# Patient Record
Sex: Female | Born: 1960 | Race: White | Hispanic: No | Marital: Married | Smoking: Never smoker
Health system: Southern US, Community
[De-identification: ages and names within clinical notes are randomized; demographics above are authoritative.]

## PROBLEM LIST (undated history)

## (undated) DIAGNOSIS — Z9889 Other specified postprocedural states: Secondary | ICD-10-CM

## (undated) DIAGNOSIS — H409 Unspecified glaucoma: Secondary | ICD-10-CM

## (undated) DIAGNOSIS — C50912 Malignant neoplasm of unspecified site of left female breast: Secondary | ICD-10-CM

## (undated) DIAGNOSIS — T7840XA Allergy, unspecified, initial encounter: Secondary | ICD-10-CM

## (undated) DIAGNOSIS — K5792 Diverticulitis of intestine, part unspecified, without perforation or abscess without bleeding: Secondary | ICD-10-CM

## (undated) DIAGNOSIS — F909 Attention-deficit hyperactivity disorder, unspecified type: Secondary | ICD-10-CM

## (undated) DIAGNOSIS — J189 Pneumonia, unspecified organism: Secondary | ICD-10-CM

## (undated) DIAGNOSIS — C50919 Malignant neoplasm of unspecified site of unspecified female breast: Secondary | ICD-10-CM

## (undated) DIAGNOSIS — I779 Disorder of arteries and arterioles, unspecified: Secondary | ICD-10-CM

## (undated) DIAGNOSIS — R112 Nausea with vomiting, unspecified: Secondary | ICD-10-CM

## (undated) DIAGNOSIS — C4490 Unspecified malignant neoplasm of skin, unspecified: Secondary | ICD-10-CM

## (undated) DIAGNOSIS — E785 Hyperlipidemia, unspecified: Secondary | ICD-10-CM

## (undated) DIAGNOSIS — F419 Anxiety disorder, unspecified: Secondary | ICD-10-CM

## (undated) DIAGNOSIS — Z923 Personal history of irradiation: Secondary | ICD-10-CM

## (undated) DIAGNOSIS — T8859XA Other complications of anesthesia, initial encounter: Secondary | ICD-10-CM

## (undated) DIAGNOSIS — Z1379 Encounter for other screening for genetic and chromosomal anomalies: Principal | ICD-10-CM

## (undated) HISTORY — DX: Unspecified malignant neoplasm of skin, unspecified: C44.90

## (undated) HISTORY — DX: Hyperlipidemia, unspecified: E78.5

## (undated) HISTORY — PX: TONSILLECTOMY: SUR1361

## (undated) HISTORY — PX: BREAST BIOPSY: SHX20

## (undated) HISTORY — DX: Allergy, unspecified, initial encounter: T78.40XA

## (undated) HISTORY — PX: OTHER SURGICAL HISTORY: SHX169

## (undated) HISTORY — DX: Unspecified glaucoma: H40.9

## (undated) HISTORY — DX: Encounter for other screening for genetic and chromosomal anomalies: Z13.79

---

## 1983-02-20 HISTORY — PX: WISDOM TOOTH EXTRACTION: SHX21

## 2006-12-13 ENCOUNTER — Encounter: Admission: RE | Admit: 2006-12-13 | Discharge: 2006-12-13 | Payer: Self-pay | Admitting: Obstetrics and Gynecology

## 2006-12-16 ENCOUNTER — Encounter (INDEPENDENT_AMBULATORY_CARE_PROVIDER_SITE_OTHER): Payer: Self-pay | Admitting: Diagnostic Radiology

## 2006-12-16 ENCOUNTER — Encounter: Admission: RE | Admit: 2006-12-16 | Discharge: 2006-12-16 | Payer: Self-pay | Admitting: Obstetrics and Gynecology

## 2007-07-04 ENCOUNTER — Encounter: Admission: RE | Admit: 2007-07-04 | Discharge: 2007-07-04 | Payer: Self-pay | Admitting: Obstetrics and Gynecology

## 2008-01-08 ENCOUNTER — Encounter: Admission: RE | Admit: 2008-01-08 | Discharge: 2008-01-08 | Payer: Self-pay | Admitting: Obstetrics and Gynecology

## 2009-01-10 ENCOUNTER — Encounter: Admission: RE | Admit: 2009-01-10 | Discharge: 2009-01-10 | Payer: Self-pay | Admitting: Obstetrics and Gynecology

## 2010-01-17 ENCOUNTER — Encounter: Admission: RE | Admit: 2010-01-17 | Discharge: 2010-01-17 | Payer: Self-pay | Admitting: Obstetrics and Gynecology

## 2011-01-30 ENCOUNTER — Other Ambulatory Visit: Payer: Self-pay | Admitting: Obstetrics and Gynecology

## 2011-01-30 DIAGNOSIS — Z1231 Encounter for screening mammogram for malignant neoplasm of breast: Secondary | ICD-10-CM

## 2011-03-07 ENCOUNTER — Ambulatory Visit
Admission: RE | Admit: 2011-03-07 | Discharge: 2011-03-07 | Disposition: A | Discharge status: Home / Self Care | Payer: BC Managed Care – PPO | Source: Ambulatory Visit | Attending: Obstetrics and Gynecology | Admitting: Obstetrics and Gynecology

## 2011-03-07 ENCOUNTER — Ambulatory Visit: Payer: Self-pay

## 2011-03-07 DIAGNOSIS — Z1231 Encounter for screening mammogram for malignant neoplasm of breast: Secondary | ICD-10-CM

## 2012-02-26 ENCOUNTER — Other Ambulatory Visit: Payer: Self-pay | Admitting: Obstetrics and Gynecology

## 2012-02-26 DIAGNOSIS — Z1231 Encounter for screening mammogram for malignant neoplasm of breast: Secondary | ICD-10-CM

## 2012-03-21 ENCOUNTER — Ambulatory Visit
Admission: RE | Admit: 2012-03-21 | Discharge: 2012-03-21 | Disposition: A | Discharge status: Home / Self Care | Payer: BC Managed Care – PPO | Source: Ambulatory Visit | Attending: Obstetrics and Gynecology | Admitting: Obstetrics and Gynecology

## 2012-03-21 DIAGNOSIS — Z1231 Encounter for screening mammogram for malignant neoplasm of breast: Secondary | ICD-10-CM

## 2013-03-16 ENCOUNTER — Other Ambulatory Visit: Payer: Self-pay

## 2013-03-16 DIAGNOSIS — Z1231 Encounter for screening mammogram for malignant neoplasm of breast: Secondary | ICD-10-CM

## 2013-04-01 ENCOUNTER — Ambulatory Visit
Admission: RE | Admit: 2013-04-01 | Discharge: 2013-04-01 | Disposition: A | Discharge status: Home / Self Care | Payer: BC Managed Care – PPO | Source: Ambulatory Visit

## 2013-04-01 DIAGNOSIS — Z1231 Encounter for screening mammogram for malignant neoplasm of breast: Secondary | ICD-10-CM

## 2014-03-18 ENCOUNTER — Other Ambulatory Visit: Payer: Self-pay

## 2014-03-18 DIAGNOSIS — Z1231 Encounter for screening mammogram for malignant neoplasm of breast: Secondary | ICD-10-CM

## 2014-04-05 ENCOUNTER — Ambulatory Visit: Payer: Self-pay

## 2014-04-06 ENCOUNTER — Ambulatory Visit: Payer: Self-pay

## 2014-04-13 ENCOUNTER — Ambulatory Visit
Admission: RE | Admit: 2014-04-13 | Discharge: 2014-04-13 | Disposition: A | Discharge status: Home / Self Care | Payer: BLUE CROSS/BLUE SHIELD | Source: Ambulatory Visit

## 2014-04-13 DIAGNOSIS — Z1231 Encounter for screening mammogram for malignant neoplasm of breast: Secondary | ICD-10-CM

## 2015-03-30 ENCOUNTER — Other Ambulatory Visit: Payer: Self-pay

## 2015-03-30 DIAGNOSIS — Z1231 Encounter for screening mammogram for malignant neoplasm of breast: Secondary | ICD-10-CM

## 2015-04-26 ENCOUNTER — Ambulatory Visit: Payer: BLUE CROSS/BLUE SHIELD

## 2015-05-10 ENCOUNTER — Ambulatory Visit
Admission: RE | Admit: 2015-05-10 | Discharge: 2015-05-10 | Disposition: A | Discharge status: Home / Self Care | Payer: BLUE CROSS/BLUE SHIELD | Source: Ambulatory Visit

## 2015-05-10 DIAGNOSIS — Z1231 Encounter for screening mammogram for malignant neoplasm of breast: Secondary | ICD-10-CM

## 2016-02-20 DIAGNOSIS — Z923 Personal history of irradiation: Secondary | ICD-10-CM

## 2016-02-20 HISTORY — DX: Personal history of irradiation: Z92.3

## 2016-04-06 ENCOUNTER — Other Ambulatory Visit: Payer: Self-pay | Admitting: Obstetrics and Gynecology

## 2016-04-06 DIAGNOSIS — Z1231 Encounter for screening mammogram for malignant neoplasm of breast: Secondary | ICD-10-CM

## 2016-05-10 ENCOUNTER — Ambulatory Visit
Admission: RE | Admit: 2016-05-10 | Discharge: 2016-05-10 | Disposition: A | Discharge status: Home / Self Care | Payer: BLUE CROSS/BLUE SHIELD | Source: Ambulatory Visit | Attending: Obstetrics and Gynecology | Admitting: Obstetrics and Gynecology

## 2016-05-10 ENCOUNTER — Ambulatory Visit: Payer: BLUE CROSS/BLUE SHIELD

## 2016-05-10 DIAGNOSIS — Z1231 Encounter for screening mammogram for malignant neoplasm of breast: Secondary | ICD-10-CM

## 2016-05-11 ENCOUNTER — Other Ambulatory Visit: Payer: Self-pay | Admitting: Obstetrics and Gynecology

## 2016-05-11 DIAGNOSIS — R928 Other abnormal and inconclusive findings on diagnostic imaging of breast: Secondary | ICD-10-CM

## 2016-05-11 DIAGNOSIS — R921 Mammographic calcification found on diagnostic imaging of breast: Secondary | ICD-10-CM

## 2016-05-16 ENCOUNTER — Ambulatory Visit
Admission: RE | Admit: 2016-05-16 | Discharge: 2016-05-16 | Disposition: A | Discharge status: Home / Self Care | Payer: BLUE CROSS/BLUE SHIELD | Source: Ambulatory Visit | Attending: Obstetrics and Gynecology | Admitting: Obstetrics and Gynecology

## 2016-05-16 ENCOUNTER — Other Ambulatory Visit: Payer: Self-pay | Admitting: Obstetrics and Gynecology

## 2016-05-16 DIAGNOSIS — N6489 Other specified disorders of breast: Secondary | ICD-10-CM

## 2016-05-16 DIAGNOSIS — R928 Other abnormal and inconclusive findings on diagnostic imaging of breast: Secondary | ICD-10-CM

## 2016-05-16 DIAGNOSIS — R921 Mammographic calcification found on diagnostic imaging of breast: Secondary | ICD-10-CM

## 2016-05-22 ENCOUNTER — Ambulatory Visit
Admission: RE | Admit: 2016-05-22 | Discharge: 2016-05-22 | Disposition: A | Discharge status: Home / Self Care | Payer: BLUE CROSS/BLUE SHIELD | Source: Ambulatory Visit | Attending: Obstetrics and Gynecology | Admitting: Obstetrics and Gynecology

## 2016-05-22 ENCOUNTER — Other Ambulatory Visit: Payer: Self-pay | Admitting: Obstetrics and Gynecology

## 2016-05-22 DIAGNOSIS — N6489 Other specified disorders of breast: Secondary | ICD-10-CM

## 2016-05-23 ENCOUNTER — Telehealth: Payer: Self-pay | Admitting: *Deleted

## 2016-05-23 NOTE — Telephone Encounter (Signed)
Confirmed [facility] for 05/30/16 at 0815 .  Instructions and contact information given.  

## 2016-05-25 ENCOUNTER — Encounter: Payer: Self-pay | Admitting: *Deleted

## 2016-05-25 ENCOUNTER — Other Ambulatory Visit: Payer: Self-pay | Admitting: *Deleted

## 2016-05-25 DIAGNOSIS — Z170 Estrogen receptor positive status [ER+]: Secondary | ICD-10-CM

## 2016-05-25 DIAGNOSIS — C50212 Malignant neoplasm of upper-inner quadrant of left female breast: Secondary | ICD-10-CM

## 2016-05-29 ENCOUNTER — Ambulatory Visit: Payer: BLUE CROSS/BLUE SHIELD

## 2016-05-30 ENCOUNTER — Encounter: Payer: Self-pay | Admitting: Hematology

## 2016-05-30 ENCOUNTER — Ambulatory Visit: Payer: BLUE CROSS/BLUE SHIELD | Attending: General Surgery | Admitting: Physical Therapy

## 2016-05-30 ENCOUNTER — Other Ambulatory Visit (HOSPITAL_BASED_OUTPATIENT_CLINIC_OR_DEPARTMENT_OTHER): Payer: BLUE CROSS/BLUE SHIELD

## 2016-05-30 ENCOUNTER — Ambulatory Visit
Admission: RE | Admit: 2016-05-30 | Discharge: 2016-05-30 | Disposition: A | Discharge status: Home / Self Care | Payer: BLUE CROSS/BLUE SHIELD | Source: Ambulatory Visit | Attending: Radiation Oncology | Admitting: Radiation Oncology

## 2016-05-30 ENCOUNTER — Ambulatory Visit (HOSPITAL_BASED_OUTPATIENT_CLINIC_OR_DEPARTMENT_OTHER): Payer: BLUE CROSS/BLUE SHIELD | Admitting: Hematology

## 2016-05-30 ENCOUNTER — Other Ambulatory Visit: Payer: Self-pay | Admitting: *Deleted

## 2016-05-30 ENCOUNTER — Other Ambulatory Visit: Payer: Self-pay | Admitting: General Surgery

## 2016-05-30 ENCOUNTER — Encounter: Payer: Self-pay | Admitting: Physical Therapy

## 2016-05-30 VITALS — BP 134/66 | HR 75 | Temp 97.7°F | Resp 18 | Wt 134.1 lb

## 2016-05-30 DIAGNOSIS — Z888 Allergy status to other drugs, medicaments and biological substances status: Secondary | ICD-10-CM | POA: Insufficient documentation

## 2016-05-30 DIAGNOSIS — R293 Abnormal posture: Secondary | ICD-10-CM | POA: Insufficient documentation

## 2016-05-30 DIAGNOSIS — C50212 Malignant neoplasm of upper-inner quadrant of left female breast: Secondary | ICD-10-CM

## 2016-05-30 DIAGNOSIS — H409 Unspecified glaucoma: Secondary | ICD-10-CM | POA: Diagnosis not present

## 2016-05-30 DIAGNOSIS — Z170 Estrogen receptor positive status [ER+]: Secondary | ICD-10-CM | POA: Insufficient documentation

## 2016-05-30 DIAGNOSIS — Z79899 Other long term (current) drug therapy: Secondary | ICD-10-CM | POA: Insufficient documentation

## 2016-05-30 DIAGNOSIS — Z85828 Personal history of other malignant neoplasm of skin: Secondary | ICD-10-CM | POA: Insufficient documentation

## 2016-05-30 DIAGNOSIS — Z9889 Other specified postprocedural states: Secondary | ICD-10-CM | POA: Insufficient documentation

## 2016-05-30 DIAGNOSIS — F419 Anxiety disorder, unspecified: Secondary | ICD-10-CM | POA: Insufficient documentation

## 2016-05-30 DIAGNOSIS — Z510 Encounter for antineoplastic radiation therapy: Secondary | ICD-10-CM | POA: Insufficient documentation

## 2016-05-30 DIAGNOSIS — Z8541 Personal history of malignant neoplasm of cervix uteri: Secondary | ICD-10-CM | POA: Insufficient documentation

## 2016-05-30 DIAGNOSIS — Z803 Family history of malignant neoplasm of breast: Secondary | ICD-10-CM | POA: Insufficient documentation

## 2016-05-30 LAB — COMPREHENSIVE METABOLIC PANEL
ALT: 35 U/L (ref 0–55)
AST: 24 U/L (ref 5–34)
Albumin: 4.4 g/dL (ref 3.5–5.0)
Alkaline Phosphatase: 74 U/L (ref 40–150)
Anion Gap: 9 mEq/L (ref 3–11)
BUN: 11.3 mg/dL (ref 7.0–26.0)
CHLORIDE: 104 meq/L (ref 98–109)
CO2: 27 meq/L (ref 22–29)
Calcium: 9.7 mg/dL (ref 8.4–10.4)
Creatinine: 0.7 mg/dL (ref 0.6–1.1)
EGFR: 90 mL/min/{1.73_m2} — AB (ref >90)
Glucose: 85 mg/dl (ref 70–140)
Potassium: 3.5 mEq/L (ref 3.5–5.1)
Sodium: 140 mEq/L (ref 136–145)
Total Bilirubin: 0.59 mg/dL (ref 0.20–1.20)
Total Protein: 7.8 g/dL (ref 6.4–8.3)

## 2016-05-30 LAB — CBC WITH DIFFERENTIAL/PLATELET
BASO%: 0.6 % (ref 0.0–2.0)
BASOS ABS: 0 10*3/uL (ref 0.0–0.1)
EOS ABS: 0.1 10*3/uL (ref 0.0–0.5)
EOS%: 1.3 % (ref 0.0–7.0)
HEMATOCRIT: 41.1 % (ref 34.8–46.6)
HEMOGLOBIN: 14.2 g/dL (ref 11.6–15.9)
LYMPH%: 32.3 % (ref 14.0–49.7)
MCH: 34.5 pg — AB (ref 25.1–34.0)
MCHC: 34.6 g/dL (ref 31.5–36.0)
MCV: 99.7 fL (ref 79.5–101.0)
MONO#: 0.6 10*3/uL (ref 0.1–0.9)
MONO%: 11.2 % (ref 0.0–14.0)
NEUT#: 2.8 10*3/uL (ref 1.5–6.5)
NEUT%: 54.6 % (ref 38.4–76.8)
Platelets: 169 10*3/uL (ref 145–400)
RBC: 4.13 10*6/uL (ref 3.70–5.45)
RDW: 12.4 % (ref 11.2–14.5)
WBC: 5.1 10*3/uL (ref 3.9–10.3)
lymph#: 1.6 10*3/uL (ref 0.9–3.3)

## 2016-05-30 NOTE — Progress Notes (Signed)
Nutrition Assessment  Reason for Assessment:  Pt seen in Breast Clinic  ASSESSMENT:   57 year old female with new diagnosis of left breast cancer.  Past medical history reviewed  Medications:  reviewed  Labs: reviewed  Anthropometrics:   Height: not reported Weight: 134 lb 1.6 oz    NUTRITION DIAGNOSIS: Food and nutrition related knowledge deficit related to new diagnosis of breast cancer as evidenced by no prior need for nutrition related information.  INTERVENTION:   Discussed and provided packet of information regarding nutritional tips for breast cancer patients.  Questions answered.  Teachback method used.  Contact information provided and patient knows to contact me with questions/concerns.    MONITORING, EVALUATION, and GOAL: Pt will consume a healthy plant based diet to maintain lean body mass throughout treatment.   Bryan Goin B. Zenia Resides, [place], [place] Registered Dietitian [phone] (pager)

## 2016-05-30 NOTE — Patient Instructions (Signed)

## 2016-05-30 NOTE — Progress Notes (Signed)
Radiation Oncology         [phone] ________________________________  Name: Sally Smith MRN: [id number]  Date: 05/30/2016  DOB: 1960/07/15  GE:XBMWU,XLKGMWN, MD  Stark Klein, MD     REFERRING PHYSICIAN: Stark Klein, MD   DIAGNOSIS: The encounter diagnosis was Malignant neoplasm of upper-inner quadrant of left breast in female, estrogen receptor positive ([place]).   HISTORY OF PRESENT ILLNESS: Sally Smith is a 56 y.o. female seen at in the multidisciplinary breast clinic for a new diagnosis of left breast cancer. The patient was found to have distortion of the left breast. Diagnostic imaging revealed a 6 mm lesion at the 11:30 position, and her axilla was negative for adenopathy. She underwent biopsy of the breast lesion on 05/22/16 which revealed a grad 1-2 invasive lobular carcinoma, ER/PR positive, HER2 negative, with a Ki 67 of 5%. She comes for     PREVIOUS RADIATION THERAPY: No   PAST MEDICAL HISTORY:  Past Medical History:  Diagnosis Date  . Cervical cancer ([place])        PAST SURGICAL HISTORY: Past Surgical History:  Procedure Laterality Date  . TONSILLECTOMY       FAMILY HISTORY:  Family History  Problem Relation Age of Onset  . Breast cancer Mother 21     SOCIAL HISTORY:  reports that she has never smoked. She does not have any smokeless tobacco history on file. She reports that she does not drink alcohol or use drugs. The patient is married and lives in [place], [place].    ALLERGIES: Patient has no allergy information on record.   MEDICATIONS:  No current outpatient prescriptions on file.   No current facility-administered medications for this encounter.      REVIEW OF SYSTEMS: On review of systems, the patient reports that she is doing well overall. She denies any chest pain, shortness of breath, cough, fevers, chills, night sweats, unintended weight changes. She denies any bowel or bladder disturbances, and denies abdominal pain, nausea or  vomiting. She denies any new musculoskeletal or joint aches or pains. A complete review of systems is obtained and is otherwise negative.     PHYSICAL EXAM:  Wt Readings from Last 3 Encounters:  05/30/16 134 lb 1.6 oz (60.8 kg)   Temp Readings from Last 3 Encounters:  05/30/16 97.7 F (36.5 C) (Oral)   BP Readings from Last 3 Encounters:  05/30/16 134/66   Pulse Readings from Last 3 Encounters:  05/30/16 75      In general this is a well appearing cauvasian female in no acute distress. She is alert and oriented x4 and appropriate throughout the examination. HEENT reveals that the patient is normocephalic, atraumatic. EOMs are intact. PERRLA. Skin is intact without any evidence of gross lesions. Cardiovascular exam reveals a regular rate and rhythm, no clicks rubs or murmurs are auscultated. Chest is clear to auscultation bilaterally. Lymphatic assessment is performed and does not reveal any adenopathy in the cervical, supraclavicular, axillary, or inguinal chains. Bilateral breast exam is performed. The right breast is negative for palpable masses. The left breast reveals post biopsy changes with ecchymosis and induration deep to the biopsy site. No palpable mass is appreciated, and no nipple bleeding or discharge is noted of either breast.  Abdomen has active bowel sounds in all quadrants and is intact. The abdomen is soft, non tender, non distended. Lower extremities are negative for pretibial pitting edema, deep calf tenderness, cyanosis or clubbing.   ECOG = 0  0 - Asymptomatic (  Fully active, able to carry on all predisease activities without restriction)  1 - Symptomatic but completely ambulatory (Restricted in physically strenuous activity but ambulatory and able to carry out work of a light or sedentary nature. For example, light housework, office work)  2 - Symptomatic, <50% in bed during the day (Ambulatory and capable of all self care but unable to carry out any work  activities. Up and about more than 50% of waking hours)  3 - Symptomatic, >50% in bed, but not bedbound (Capable of only limited self-care, confined to bed or chair 50% or more of waking hours)  4 - Bedbound (Completely disabled. Cannot carry on any self-care. Totally confined to bed or chair)  5 - Death   Santiago Glad MM, Creech RH, Tormey DC, et al. [phone]). "Toxicity and response criteria of the [facility]". Am. Evlyn Clines. Oncol. 5 (6): 649-55    LABORATORY DATA:  Lab Results  Component Value Date   WBC 5.1 05/30/2016   HGB 14.2 05/30/2016   HCT 41.1 05/30/2016   MCV 99.7 05/30/2016   PLT 169 05/30/2016   Lab Results  Component Value Date   NA 140 05/30/2016   K 3.5 05/30/2016   CO2 27 05/30/2016   Lab Results  Component Value Date   ALT 35 05/30/2016   AST 24 05/30/2016   ALKPHOS 74 05/30/2016   BILITOT 0.59 05/30/2016      RADIOGRAPHY: US Breast Ltd Uni Left Inc Axilla  Result Date: 05/16/2016 CLINICAL DATA:  The patient returns after screening study for evaluation of possible left breast distortion and calcifications. EXAM: DIGITAL DIAGNOSTIC LEFT MAMMOGRAM WITH CAD ULTRASOUND LEFT BREAST COMPARISON:  Prior studies including 05/10/2016 ACR Breast Density Category c: The breast tissue is heterogeneously dense, which may obscure small masses. FINDINGS: Additional images are performed, showing no suspicious microcalcifications in the left breast. 2D/3D images confirm presence of distortion in the upper inner quadrant of the left breast. Other area of questioned distortion is not persistent on further evaluation. Mammographic images were processed with CAD. On physical exam, I palpate no abnormality in the upper central portion of the left breast. Targeted ultrasound is performed, showing a vague area of acoustic shadowing in the 11:30 o'clock location of the left breast 3 cm from nipple. This is estimated to measure approximately 6 mm and is associated with  adjacent vascularity on Doppler evaluation. Evaluation of the axilla is negative for adenopathy. IMPRESSION: 1. Persistent distortion in the upper inner quadrant of the left breast possibly correlating with the sonographic area of acoustic shadowing. 2. Lesion is best seen is radiographically. RECOMMENDATION: Stereotactic guided core biopsy is recommended of the left breast distortion. I have discussed the findings and recommendations with the patient. Results were also provided in writing at the conclusion of the visit. If applicable, a reminder letter will be sent to the patient regarding the next appointment. BI-RADS CATEGORY  4: Suspicious. Electronically Signed   By: Norva Pavlov M.D.   On: 05/16/2016 17:37   Mm Diag Breast Tomo Uni Left  Result Date: 05/16/2016 CLINICAL DATA:  The patient returns after screening study for evaluation of possible left breast distortion and calcifications. EXAM: DIGITAL DIAGNOSTIC LEFT MAMMOGRAM WITH CAD ULTRASOUND LEFT BREAST COMPARISON:  Prior studies including 05/10/2016 ACR Breast Density Category c: The breast tissue is heterogeneously dense, which may obscure small masses. FINDINGS: Additional images are performed, showing no suspicious microcalcifications in the left breast. 2D/3D images confirm presence of distortion in the upper  inner quadrant of the left breast. Other area of questioned distortion is not persistent on further evaluation. Mammographic images were processed with CAD. On physical exam, I palpate no abnormality in the upper central portion of the left breast. Targeted ultrasound is performed, showing a vague area of acoustic shadowing in the 11:30 o'clock location of the left breast 3 cm from nipple. This is estimated to measure approximately 6 mm and is associated with adjacent vascularity on Doppler evaluation. Evaluation of the axilla is negative for adenopathy. IMPRESSION: 1. Persistent distortion in the upper inner quadrant of the left breast  possibly correlating with the sonographic area of acoustic shadowing. 2. Lesion is best seen is radiographically. RECOMMENDATION: Stereotactic guided core biopsy is recommended of the left breast distortion. I have discussed the findings and recommendations with the patient. Results were also provided in writing at the conclusion of the visit. If applicable, a reminder letter will be sent to the patient regarding the next appointment. BI-RADS CATEGORY  4: Suspicious. Electronically Signed   By: Nolon Nations M.D.   On: 05/16/2016 17:37   Mm Screening Breast Tomo Bilateral  Result Date: 05/10/2016 CLINICAL DATA:  Screening. EXAM: 2D DIGITAL SCREENING BILATERAL MAMMOGRAM WITH CAD AND ADJUNCT TOMO COMPARISON:  Previous exam(s). ACR Breast Density Category c: The breast tissue is heterogeneously dense, which may obscure small masses. FINDINGS: In the left breast, possible distortion with calcifications warrants further evaluation. In the right breast, no findings suspicious for malignancy. Images were processed with CAD. IMPRESSION: Further evaluation is suggested for possible distortion with calcifications in the left breast. RECOMMENDATION: Diagnostic mammogram and possibly ultrasound of the left breast. (Code:[id number]) The patient will be contacted regarding the findings, and additional imaging will be scheduled. BI-RADS CATEGORY  0: Incomplete. Need additional imaging evaluation and/or prior mammograms for comparison. Electronically Signed   By: Ammie Ferrier M.D.   On: 05/10/2016 15:21   Mm Clip Placement Left  Result Date: 05/22/2016 CLINICAL DATA:  Status post stereotactic guided core biopsy of distortion in the left breast. EXAM: 3D DIAGNOSTIC LEFT MAMMOGRAM POST STEREOTACTIC BIOPSY COMPARISON:  Previous exam(s). FINDINGS: 3D Mammographic images were obtained following stereotactic guided biopsy of distortion in the upper inner quadrant of the left breast cyst. Clip was placed at the conclusion of  the biopsy, with the patient in Trendelenburg following a vasovagal reaction. Clip is within the upper inner quadrant of the left breast, 1 cm anterior and inferior to the central point of distortion. IMPRESSION: Tissue marker clip is 1 cm anterior and inferior to the distortion. Final Assessment: Post Procedure Mammograms for Marker Placement Electronically Signed   By: Nolon Nations M.D.   On: 05/22/2016 11:27   Mm Lt Breast Bx W Loc Dev 1st Lesion Image Bx Spec Stereo Guide  Addendum Date: 05/24/2016   ADDENDUM REPORT: 05/23/2016 15:44 ADDENDUM: Pathology revealed GRADE I-II INVASIVE AND IN SITU MAMMARY CARCINOMA of the Left breast, upper inner. This was found to be concordant by Dr. Nolon Nations. Pathology results were discussed with the patient by telephone. The patient reported doing well after the biopsy with tenderness and minimal bleeding at the site. Post biopsy instructions and care were reviewed and questions were answered. The patient was encouraged to call [facility] for any additional concerns. The patient was referred to [facility] on May 30, 2016. Pathology results reported by Terie Purser, RN on 05/23/2016. Electronically Signed   By:  Nolon Nations M.D.   On: 05/23/2016 15:44   Result Date: 05/24/2016 CLINICAL DATA:  Patient presents for stereotactic guided core biopsy of distortion in the left breast. EXAM: LEFT BREAST STEREOTACTIC CORE NEEDLE BIOPSY COMPARISON:  Previous exams. FINDINGS: The patient and I discussed the procedure of stereotactic-guided biopsy including benefits and alternatives. We discussed the high likelihood of a successful procedure. We discussed the risks of the procedure including infection, bleeding, tissue injury, clip migration, and inadequate sampling. Informed written consent was given. The usual time out protocol was performed immediately prior to the  procedure. Using sterile technique and 1% Lidocaine as local anesthetic, under stereotactic guidance, a 9 gauge vacuum assisted device was used to perform core needle biopsy of distortion in the upper inner quadrant of the left breast using a cephalad approach. After the fourth of 6 biopsy cores, the patient had a vasovagal reaction. She was placed in Trendelenburg position. Patient quickly recovered over the next 5 minutes, and observed for another 30-45 minutes. A coil shaped tissue marker clip was placed while the patient was in Trendelenburg position. Compression was applied to ensure hemostasis. Follow-up 2-view mammogram was performed and dictated separately. IMPRESSION: Stereotactic-guided biopsy of left breast distortion. Procedure was complicated by a vasovagal reaction. Electronically Signed: By: Nolon Nations M.D. On: 05/22/2016 10:49    IMPRESSION/PLAN: 1. Stage IA, cT1a,N0,M0 grade 1-2, ER/PR positive invasive lobular carcinoma of the left breast. Dr. Lisbeth Renshaw discusses the pathology findings and reviews the nature of invasive lobular disease. The consensus from the breast conference included an MRI for extent of disease, as well as breast conservation with lumpectomy and sentinel mapping. If her tumor was greater than 1 cm on final pathology, oncotype would be ordered. Provided that chemotherapy is not indicated, the patient's course would then be followed by external radiotherapy to the breast followed by antiestrogen therapy. We discussed the risks, benefits, short, and long term effects of radiotherapy, and the patient is interested in proceeding. Dr. Lisbeth Renshaw discusses the delivery and logistics of radiotherapy. Dr. Lisbeth Renshaw would recommend a course of 6 1/2 weeks of treatment, and discussed deep inspiration breath hold technique to avoid the heart. The patient lives in [place], [place] but is interested in keeping her care here in [place]. We will see her back about 2 weeks after surgery to move  forward with the simulation and planning process and anticipate starting radiotherapy about 4 weeks after surgery.  2. Possible genetic predisposition to malignancy. The patient is planning to meet with genetic counseling urgently. We will follow up with this expectantly.   The above documentation reflects my direct findings during this shared patient visit. Please see the separate note by Dr. Lisbeth Renshaw on this date for the remainder of the patient's plan of care.    Carola Rhine, PAC

## 2016-05-30 NOTE — Progress Notes (Signed)
[place]  [phone] Fax:([phone]) [place] Note   Patient Care Team: Hoyt Koch, MD as PCP - General (Obstetrics and Gynecology) [name], MD as Consulting Physician (Hematology) Stark Klein, MD as Consulting Physician (General Surgery) Kyung Rudd, MD as Consulting Physician (Radiation Oncology) 05/30/2016  CHIEF COMPLAINTS/PURPOSE OF CONSULTATION:  Left breast cancer  Oncology History   Cancer Staging Malignant neoplasm of upper-inner quadrant of left breast in female, estrogen receptor positive ([place]) Staging form: Breast, AJCC 8th Edition - Clinical stage from 05/22/2016: Stage IA (cT1a, cN0, cM0, G2, ER: Positive, PR: Positive, HER2: Negative) - Signed by [name], MD on 05/29/2016       Malignant neoplasm of upper-inner quadrant of left breast in female, estrogen receptor positive ([place])   05/10/2016 Mammogram    Bilateral screening mammogram on 05/10/16 showed a possible distortion with calcifications in the left breast.      05/16/2016 Mammogram    Diagnostic mammogram and US showed Persistent distortion in the upper inner quadrant of the left breast possibly correlating with the sonographic area of acoustic shadowing, measuring about 54m. [place] left axilla (-)      05/22/2016 Receptors her2    ER 100%, PR 70%+, Ki67 5%      05/22/2016 Initial Biopsy    Diagnosis Breast, left, needle core biopsy, upper inner - INVASIVE AND IN SITU LOBULAR CARCINOMA, G1-2      05/22/2016 Initial Diagnosis    Malignant neoplasm of upper-inner quadrant of left breast in female, estrogen receptor positive ([place]       HISTORY OF PRESENTING ILLNESS (05/30/2016):  Sally [id number].o. female is here because of a new diagnosis of left breast cancer.  Bilateral screening mammogram on 05/10/16 showed a possible distortion with calcifications in the left breast. Diagnostic left breast mammogram on 05/16/16 showed no suspicious  microcalcifications in the left breast, but did confirm the presence of distortion in the upper inner quadrant of the left breast. Other areas of questioned distortion was not persistent on further evaluation. [place] the left breast showed a vague area of acoustic shadowing in the 11:30 position 3 cm fron the nipple measuring about  0.6 cm and associated with adjacent vascularity on Doppler evaluation. The left axilla was negative for adenopathy.  Biopsy of the UIQ of the left breast on 05/22/16 revealed grade 1-2 revealed invasive lobular carcinoma and LCIS. ER 100%+, PR 70%+, Ki67 5%  The patient presents today with her husband in multidisciplinary breast clinic to discuss treatment options for the management of her disease. The patient's breast density category is C: The breast tissue is heterogeneously dense, which may obscure small masses.  GYN HISTORY  Menarchal: 9 LMP: 48 Contraceptive: Post-menopause HRT: No GP: G3P2, one child miscarriage  Denies pain, discomfort, arthritis, changes in appetite or weight, HTN, diabetes, or hyperlipidemia. The patient reports cervical cancer at age 56 [facility] She's has 2 children since then. She had skin cancer of the right lower extremity about 10 years ago by Dr. WRhona Raider She also had removal of basal cell carcinoma of the right face a few years ago. She has glaucoma. She reports having bad hot flashes when she entered menopause. She has hot flashes 1-2 times a month now.  MEDICAL HISTORY:  Past Medical History:  Diagnosis Date  . Skin cancer     SURGICAL HISTORY: Past Surgical History:  Procedure Laterality Date  . TONSILLECTOMY  SOCIAL HISTORY: Social History   Social History  . Marital status: Married    Spouse name: N/A  . Number of children: N/A  . Years of education: N/A   Occupational History  . Not on file.   Social History Main Topics  . Smoking status: Never Smoker  . Smokeless tobacco:  Never Used  . Alcohol use No  . Drug use: No  . Sexual activity: Not on file   Other Topics Concern  . Not on file   Social History Narrative  . No narrative on file    FAMILY HISTORY: Family History  Problem Relation Age of Onset  . Breast cancer Mother 49    ALLERGIES:  is allergic to bee venom; latex; and sulfur.  MEDICATIONS:  Current Outpatient Prescriptions  Medication Sig Dispense Refill  . Travoprost, BAK Free, (TRAVATAN) 0.004 % SOLN ophthalmic solution 1 drop at bedtime.     No current facility-administered medications for this visit.     REVIEW OF SYSTEMS:   Constitutional: Denies fevers, chills or abnormal night sweats Eyes: Denies blurriness of vision, double vision or watery eyes Ears, nose, mouth, throat, and face: Denies mucositis or sore throat Respiratory: Denies cough, dyspnea or wheezes Cardiovascular: Denies palpitation, chest discomfort or lower extremity swelling Gastrointestinal:  Denies nausea, heartburn or change in bowel habits Skin: Denies abnormal skin rashes Lymphatics: Denies new lymphadenopathy or easy bruising Neurological:Denies numbness, tingling or new weaknesses Behavioral/Psych: Mood is stable, no new changes  All other systems were reviewed with the patient and are negative.  PHYSICAL EXAMINATION: ECOG PERFORMANCE STATUS: 0 - Asymptomatic  Vitals:   05/30/16 0915  BP: 134/66  Pulse: 75  Resp: 18  Temp: 97.7 F (36.5 C)   Filed Weights   05/30/16 0915  Weight: 134 lb 1.6 oz (60.8 kg)    GENERAL:alert, no distress and comfortable SKIN: skin color, texture, turgor are normal, no rashes or significant lesions EYES: normal, conjunctiva are pink and non-injected, sclera clear OROPHARYNX:no exudate, no erythema and lips, buccal mucosa, and tongue normal  NECK: supple, thyroid normal size, non-tender, without nodularity LYMPH:  no palpable lymphadenopathy in the cervical, axillary or inguinal LUNGS: clear to auscultation  and percussion with normal breathing effort HEART: regular rate & rhythm and no murmurs and no lower extremity edema ABDOMEN:abdomen soft, non-tender and normal bowel sounds Musculoskeletal:no cyanosis of digits and no clubbing  PSYCH: alert & oriented x 3 with fluent speech NEURO: no focal motor/sensory deficits Breast: Breast inspection showed them to be symmetrical with no nipple discharge. Skin ecchymosis at the left breast biopsy site with a 1.5 cm lump at the biopsy site new to the patient, possibly a hematoma. No other masses noted in either breast.  LABORATORY DATA:  I have reviewed the data as listed CBC Latest Ref Rng & Units 05/30/2016  WBC 3.9 - 10.3 10e3/uL 5.1  Hemoglobin 11.6 - 15.9 g/dL 14.2  Hematocrit 34.8 - 46.6 % 41.1  Platelets 145 - 400 10e3/uL 169   CMP Latest Ref Rng & Units 05/30/2016  Glucose 70 - 140 mg/dl 85  BUN 7.0 - 26.0 mg/dL 11.3  Creatinine 0.6 - 1.1 mg/dL 0.7  Sodium 136 - 145 mEq/L 140  Potassium 3.5 - 5.1 mEq/L 3.5  CO2 22 - 29 mEq/L 27  Calcium 8.4 - 10.4 mg/dL 9.7  Total Protein 6.4 - 8.3 g/dL 7.8  Total Bilirubin 0.20 - 1.20 mg/dL 0.59  Alkaline Phos 40 - 150 U/L 74  AST 5 -  34 U/L 24  ALT 0 - 55 U/L 35    PATHOLOGY  ADDITIONAL INFORMATION: 05/22/2016 PROGNOSTIC INDICATORS Results: IMMUNOHISTOCHEMICAL AND MORPHOMETRIC ANALYSIS PERFORMED MANUALLY Estrogen Receptor: 100%, POSITIVE, STRONG STAINING INTENSITY Progesterone Receptor: 70%, POSITIVE, STRONG STAINING INTENSITY Proliferation Marker Ki67: 5% REFERENCE RANGE ESTROGEN RECEPTOR NEGATIVE 0% POSITIVE =>1% REFERENCE RANGE PROGESTERONE RECEPTOR NEGATIVE 0% POSITIVE =>1% All controls stained appropriately Jimmy Picket MD Pathologist, Electronic Signature ( Signed 05/24/2016) The malignant cells are negative for E-cadherin, supporting a lobular phenotype. (JBK:gt, 05/23/16) Pecola Leisure MD Pathologist, Electronic Signature ( Signed 05/23/2016)DIAGNOSIS 1 of 3 FINAL for BRITTANIA, SUDBECK [id number]) Diagnosis Breast, left, needle core biopsy, upper inner - INVASIVE AND IN SITU MAMMARY CARCINOMA. - SEE COMMENT. Microscopic Comment The invasive carcinoma appears grade 1-2. An E-cadherin stain and a breast prognostic profile will be performed and the results reported separately. The results were called to [facility] on 05/23/16. (JBK:gt, 05/23/16) Pecola Leisure MD Pathologist, Electronic Signature (Case signed 05/23/2016)  RADIOGRAPHIC STUDIES: I have personally reviewed the radiological images as listed and agreed with the findings in the report. US Breast Ltd Uni Left Inc Axilla  Result Date: 05/16/2016 CLINICAL DATA:  The patient returns after screening study for evaluation of possible left breast distortion and calcifications. EXAM: DIGITAL DIAGNOSTIC LEFT MAMMOGRAM WITH CAD ULTRASOUND LEFT BREAST COMPARISON:  Prior studies including 05/10/2016 ACR Breast Density Category c: The breast tissue is heterogeneously dense, which may obscure small masses. FINDINGS: Additional images are performed, showing no suspicious microcalcifications in the left breast. 2D/3D images confirm presence of distortion in the upper inner quadrant of the left breast. Other area of questioned distortion is not persistent on further evaluation. Mammographic images were processed with CAD. On physical exam, I palpate no abnormality in the upper central portion of the left breast. Targeted ultrasound is performed, showing a vague area of acoustic shadowing in the 11:30 o'clock location of the left breast 3 cm from nipple. This is estimated to measure approximately 6 mm and is associated with adjacent vascularity on Doppler evaluation. Evaluation of the axilla is negative for adenopathy. IMPRESSION: 1. Persistent distortion in the upper inner quadrant of the left breast possibly correlating with the sonographic area of acoustic shadowing. 2. Lesion is best seen is radiographically.  RECOMMENDATION: Stereotactic guided core biopsy is recommended of the left breast distortion. I have discussed the findings and recommendations with the patient. Results were also provided in writing at the conclusion of the visit. If applicable, a reminder letter will be sent to the patient regarding the next appointment. BI-RADS CATEGORY  4: Suspicious. Electronically Signed   By: Norva Pavlov M.D.   On: 05/16/2016 17:37   Mm Diag Breast Tomo Uni Left  Result Date: 05/16/2016 CLINICAL DATA:  The patient returns after screening study for evaluation of possible left breast distortion and calcifications. EXAM: DIGITAL DIAGNOSTIC LEFT MAMMOGRAM WITH CAD ULTRASOUND LEFT BREAST COMPARISON:  Prior studies including 05/10/2016 ACR Breast Density Category c: The breast tissue is heterogeneously dense, which may obscure small masses. FINDINGS: Additional images are performed, showing no suspicious microcalcifications in the left breast. 2D/3D images confirm presence of distortion in the upper inner quadrant of the left breast. Other area of questioned distortion is not persistent on further evaluation. Mammographic images were processed with CAD. On physical exam, I palpate no abnormality in the upper central portion of the left breast. Targeted ultrasound is performed, showing a vague area of acoustic shadowing in the 11:30 o'clock location of  the left breast 3 cm from nipple. This is estimated to measure approximately 6 mm and is associated with adjacent vascularity on Doppler evaluation. Evaluation of the axilla is negative for adenopathy. IMPRESSION: 1. Persistent distortion in the upper inner quadrant of the left breast possibly correlating with the sonographic area of acoustic shadowing. 2. Lesion is best seen is radiographically. RECOMMENDATION: Stereotactic guided core biopsy is recommended of the left breast distortion. I have discussed the findings and recommendations with the patient. Results were also  provided in writing at the conclusion of the visit. If applicable, a reminder letter will be sent to the patient regarding the next appointment. BI-RADS CATEGORY  4: Suspicious. Electronically Signed   By: Nolon Nations M.D.   On: 05/16/2016 17:37   Mm Screening Breast Tomo Bilateral  Result Date: 05/10/2016 CLINICAL DATA:  Screening. EXAM: 2D DIGITAL SCREENING BILATERAL MAMMOGRAM WITH CAD AND ADJUNCT TOMO COMPARISON:  Previous exam(s). ACR Breast Density Category c: The breast tissue is heterogeneously dense, which may obscure small masses. FINDINGS: In the left breast, possible distortion with calcifications warrants further evaluation. In the right breast, no findings suspicious for malignancy. Images were processed with CAD. IMPRESSION: Further evaluation is suggested for possible distortion with calcifications in the left breast. RECOMMENDATION: Diagnostic mammogram and possibly ultrasound of the left breast. (Code:[id number]) The patient will be contacted regarding the findings, and additional imaging will be scheduled. BI-RADS CATEGORY  0: Incomplete. Need additional imaging evaluation and/or prior mammograms for comparison. Electronically Signed   By: Ammie Ferrier M.D.   On: 05/10/2016 15:21   Mm Clip Placement Left  Result Date: 05/22/2016 CLINICAL DATA:  Status post stereotactic guided core biopsy of distortion in the left breast. EXAM: 3D DIAGNOSTIC LEFT MAMMOGRAM POST STEREOTACTIC BIOPSY COMPARISON:  Previous exam(s). FINDINGS: 3D Mammographic images were obtained following stereotactic guided biopsy of distortion in the upper inner quadrant of the left breast cyst. Clip was placed at the conclusion of the biopsy, with the patient in Trendelenburg following a vasovagal reaction. Clip is within the upper inner quadrant of the left breast, 1 cm anterior and inferior to the central point of distortion. IMPRESSION: Tissue marker clip is 1 cm anterior and inferior to the distortion. Final  Assessment: Post Procedure Mammograms for Marker Placement Electronically Signed   By: Nolon Nations M.D.   On: 05/22/2016 11:27   Mm Lt Breast Bx W Loc Dev 1st Lesion Image Bx Spec Stereo Guide  Addendum Date: 05/24/2016   ADDENDUM REPORT: 05/23/2016 15:44 ADDENDUM: Pathology revealed GRADE I-II INVASIVE AND IN SITU MAMMARY CARCINOMA of the Left breast, upper inner. This was found to be concordant by Dr. Nolon Nations. Pathology results were discussed with the patient by telephone. The patient reported doing well after the biopsy with tenderness and minimal bleeding at the site. Post biopsy instructions and care were reviewed and questions were answered. The patient was encouraged to call [facility] for any additional concerns. The patient was referred to [facility] on May 30, 2016. Pathology results reported by Terie Purser, RN on 05/23/2016. Electronically Signed   By: Nolon Nations M.D.   On: 05/23/2016 15:44   Result Date: 05/24/2016 CLINICAL DATA:  Patient presents for stereotactic guided core biopsy of distortion in the left breast. EXAM: LEFT BREAST STEREOTACTIC CORE NEEDLE BIOPSY COMPARISON:  Previous exams. FINDINGS: The patient and I discussed the procedure of stereotactic-guided biopsy including benefits and alternatives.  We discussed the high likelihood of a successful procedure. We discussed the risks of the procedure including infection, bleeding, tissue injury, clip migration, and inadequate sampling. Informed written consent was given. The usual time out protocol was performed immediately prior to the procedure. Using sterile technique and 1% Lidocaine as local anesthetic, under stereotactic guidance, a 9 gauge vacuum assisted device was used to perform core needle biopsy of distortion in the upper inner quadrant of the left breast using a cephalad approach. After the fourth of 6  biopsy cores, the patient had a vasovagal reaction. She was placed in Trendelenburg position. Patient quickly recovered over the next 5 minutes, and observed for another 30-45 minutes. A coil shaped tissue marker clip was placed while the patient was in Trendelenburg position. Compression was applied to ensure hemostasis. Follow-up 2-view mammogram was performed and dictated separately. IMPRESSION: Stereotactic-guided biopsy of left breast distortion. Procedure was complicated by a vasovagal reaction. Electronically Signed: By: Nolon Nations M.D. On: 05/22/2016 10:49    ASSESSMENT & PLAN: 56 y.o. Caucasian female with screening detected left breast cancer.  1. 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 --The patient will have a bilateral breast MRI for further workup since she has invasive lobular disease, to further evaluate the size of her tumor and exclude multifocal disease.. -Giving the early invasive disease, she is likely a candidate for left lumpectomy and sentinel lymph node biopsy. She is agreeable with that. She was seen by Dr. Barry Dienes today and likely will proceed with surgery soon. -I recommend a Oncotype Dx test on the surgical sample if the tumor is greater than 1 cm in size. We'll make a decision about adjuvant chemotherapy based on the Oncotype result if required. Written material of this test was given to her. She is young and fit, would be a good candidate for chemotherapy if her Oncotype recurrence score is high. Given her lobular histology, positive for ER/PR and negative HER-2, low grade, I anticipate this is a low risk disease. -If her surgical sentinel lymph node node is positive, I recommend mammaprint for further risk stratification and guide adjuvant chemotherapy. -Giving the strong ER and PR  expression in her postmenopausal status, I recommend adjuvant endocrine therapy with aromatase inhibitor for a total of 5-10 years to reduce the risk of cancer recurrence. Potential benefits and side effects were discussed with patient and she is interested. -She was also seen by radiation oncologist Dr. Lisbeth Renshaw today. If her surgical sentinel lymph nodes were negative, she would not need post mastectomy radiation.  -We also discussed the breast cancer surveillance after her surgery. She will continue annual screening mammogram, self exam, and a routine office visit with lab and exam with [place]. -I encouraged her to have healthy diet and exercise regularly.  -I will see the patient back after radiation therapy or sooner based on Oncotype testing if warranted.  2. Genetic Counseling -The patient's mother has a history of breast cancer at an early age and the patient has a personal history of cervical cancer at the age of 56, status post surgical intervention. -The patient will be referred to genetic counseling  3. Glaucoma -Managed by her PCP. She takes Travatan drops.  PLAN -Genetic counseling -Bilateral breast MRI with and without contrast -Left lumpectomy and SLN biopsy by Dr. Barry Dienes.in near future -Oncotype DX testing on  the tumor if it is 1 cm in size or greater, or mammaprint if node positive  -I plan to see her after she completes radiation, or sooner if her Oncotype shows high-risk disease.  No orders of the defined types were placed in this encounter.   All questions were answered. The patient knows to call the clinic with any problems, questions or concerns. I spent 55 minutes counseling the patient face to face. The total time spent in the appointment was 60 minutes and more than 50% was on counseling.     [name], MD 05/30/2016  This document serves as a record of services personally performed by [name], MD. It was created on her behalf by Darcus Austin, a trained medical scribe.  The creation of this record is based on the scribe's personal observations and the provider's statements to them. This document has been checked and approved by the attending provider.

## 2016-05-30 NOTE — Therapy (Signed)
[place], [place], [address] Phone: [phone]   Fax:  [phone]  Physical Therapy Evaluation  Patient Details  Name: Sally Smith MRN: [id number] Date of Birth: [birth year] Referring Provider: Dr. Stark Klein  Encounter Date: 05/30/2016      PT End of Session - 05/30/16 1220    Visit Number 1   Number of Visits 1   PT Start Time 6568   PT Stop Time 1151   PT Time Calculation (min) 34 min   Activity Tolerance Patient tolerated treatment well   Behavior During Therapy [facility] for tasks assessed/performed      Past Medical History:  Diagnosis Date  . Skin cancer     Past Surgical History:  Procedure Laterality Date  . TONSILLECTOMY      There were no vitals filed for this visit.       Subjective Assessment - 05/30/16 1208    Subjective Patient reports she is here today to be seen by her medical team for her newly diagnosed left breast cancer.   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 It is ER/PR positive and HER2 negative with a ki67 of 5%. It measures 11m and is located in the upper inner quadrant.    Patient Stated Goals Reduce lymphedema risk and learn post op shoulder ROM HEP   Currently in Pain? No/denies            [facility]      Assessment   Medical Diagnosis Left breast cancer   Referring Provider Dr. FStark Klein  Onset Date/Surgical Date 05/10/16   Hand Dominance Left   Prior Therapy none     Precautions   Precautions Other (comment)   Precaution Comments active cancer     Restrictions   Weight Bearing Restrictions No     Balance Screen   Has the patient fallen in the past 6 months No   Has the patient had a decrease in activity level because of a fear of falling?  No   Is the patient reluctant to leave their home  because of a fear of falling?  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   Leisure Walks regularly daily for 1 miles and goes to gym 2x/week but hasn't gone since december 2017 due to pneumonia     Cognition   Overall Cognitive Status Within Functional Limits for tasks assessed     Posture/Postural Control   Posture/Postural Control Postural limitations   Postural Limitations Rounded Shoulders     ROM / Strength   AROM / PROM / Strength AROM;Strength     AROM   AROM Assessment Site Shoulder;Cervical   Right/Left Shoulder Right;Left   Right Shoulder Extension 54 Degrees   Right Shoulder Flexion 139 Degrees   Right Shoulder ABduction 145 Degrees   Right Shoulder Internal Rotation 65 Degrees   Right Shoulder External Rotation 90 Degrees   Left Shoulder Extension 55 Degrees   Left Shoulder Flexion 138 Degrees   Left Shoulder ABduction 143 Degrees   Left Shoulder Internal Rotation 64 Degrees  Left Shoulder External Rotation 85 Degrees   Cervical Flexion WNL   Cervical Extension WNL   Cervical - Right Side Bend WNL   Cervical - Left Side Bend WNL   Cervical - Right Rotation WNL   Cervical - Left Rotation WNL     Strength   Overall Strength Within functional limits for tasks performed           LYMPHEDEMA/ONCOLOGY QUESTIONNAIRE - 05/30/16 1218      Type   Cancer Type Left breast cancer     Lymphedema Assessments   Lymphedema Assessments Upper extremities     Right Upper Extremity Lymphedema   10 cm Proximal to Olecranon Process 27.3 cm   Olecranon Process 23.2 cm   10 cm Proximal to Ulnar Styloid Process 20.4 cm   Just Proximal to Ulnar Styloid Process 14 cm   Across Hand at [employer] 16.9 cm   At  [place] of 2nd Digit 5.8 cm     Left Upper Extremity Lymphedema   10 cm Proximal to Olecranon Process 27.3 cm   Olecranon Process 23.4 cm   10 cm Proximal to Ulnar Styloid Process 21 cm   Just Proximal to Ulnar Styloid Process 13.8 cm   Across Hand at [employer] 17.4 cm   At [place] of 2nd Digit 5.8 cm      Patient was instructed today in a home exercise program today for post op shoulder range of motion. These included active assist shoulder flexion in sitting, scapular retraction, wall walking with shoulder abduction, and hands behind head external rotation.  She was encouraged to do these twice a day, holding 3 seconds and repeating 5 times when permitted by her physician.          PT Education - 05/30/16 1220    Education provided Yes   Education Details Lymphedema risk reduction and post op shoulder ROM HEP   Person(s) Educated Patient;Spouse   Methods Explanation;Demonstration;Handout   Comprehension Returned demonstration;Verbalized understanding              Breast Clinic Goals - 05/30/16 1230      Patient will be able to verbalize understanding of pertinent lymphedema risk reduction practices relevant to her diagnosis specifically related to skin care.   Time 1   Period Days   Status Achieved     Patient will be able to return demonstrate and/or verbalize understanding of the post-op home exercise program related to regaining shoulder range of motion.   Time 1   Period Days   Status Achieved     Patient will be able to verbalize understanding of the importance of attending the postoperative After Breast Cancer Class for further lymphedema risk reduction education and therapeutic exercise.   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 It is ER/PR positive and HER2 negative with a ki67 of 5%. It  measures 65m and is located in the upper inner quadrant. Her multidisciplinary medical team met prior to her assessments to determine a recommended treatment plan. She is planning to have genetic testing, a left lumpectomy and sentinel node biopsy, radiation, and anti-estrogen therapy. She may benefit from post op PT to regain shoulder ROM and reduce lymphedema risk. Due to her lack of comorbidities, her eval  is of low complexity.   Rehab Potential Excellent   Clinical Impairments Affecting Rehab Potential None   PT Frequency One time visit   PT Treatment/Interventions Patient/family education;Therapeutic exercise   PT Next Visit Plan Will f/u after surgery to determine PT needs   PT Home Exercise Plan Post op shoulder ROM HEP   Consulted and Agree with Plan of Care Patient;Family member/caregiver   Family Member Consulted Husband      Patient will benefit from skilled therapeutic intervention in order to improve the following deficits and impairments:  Postural dysfunction, Decreased knowledge of precautions, Pain, Impaired UE functional use, Decreased range of motion  Visit Diagnosis: Carcinoma of upper-inner quadrant of left breast in female, estrogen receptor positive ([place]) - Plan: PT plan of care cert/re-cert  Abnormal posture - Plan: PT plan of care cert/re-cert   Patient will follow up at outpatient cancer rehab if needed following surgery.  If the patient requires physical therapy at that time, a specific plan will be dictated and sent to the referring physician for approval. The patient was educated today on appropriate basic range of motion exercises to begin post operatively and the importance of attending the After Breast Cancer class following surgery.  Patient was educated today on lymphedema risk reduction practices as it pertains to recommendations that will benefit the patient immediately following surgery.  She verbalized good understanding.  No additional physical therapy is  indicated at this time.      Problem List Patient Active Problem List   Diagnosis Date Noted  . Malignant neoplasm of upper-inner quadrant of left breast in female, estrogen receptor positive ([place]) 05/25/2016    Annia Friendly, PT 05/30/16 12:57 PM  [place], [place], [address] Phone: [phone]   Fax:  [phone]  Name: Sally Smith MRN: [id number] Date of Birth: [birth year]

## 2016-05-31 ENCOUNTER — Telehealth: Payer: Self-pay | Admitting: *Deleted

## 2016-05-31 ENCOUNTER — Encounter: Payer: Self-pay | Admitting: Hematology

## 2016-05-31 ENCOUNTER — Ambulatory Visit (HOSPITAL_BASED_OUTPATIENT_CLINIC_OR_DEPARTMENT_OTHER): Payer: BLUE CROSS/BLUE SHIELD | Admitting: Genetics

## 2016-05-31 ENCOUNTER — Other Ambulatory Visit: Payer: Self-pay | Admitting: *Deleted

## 2016-05-31 ENCOUNTER — Other Ambulatory Visit: Payer: BLUE CROSS/BLUE SHIELD

## 2016-05-31 DIAGNOSIS — C50212 Malignant neoplasm of upper-inner quadrant of left female breast: Secondary | ICD-10-CM

## 2016-05-31 DIAGNOSIS — Z170 Estrogen receptor positive status [ER+]: Secondary | ICD-10-CM | POA: Diagnosis not present

## 2016-05-31 DIAGNOSIS — C50912 Malignant neoplasm of unspecified site of left female breast: Secondary | ICD-10-CM

## 2016-05-31 MED ORDER — LORAZEPAM 0.5 MG PO TABS: 0.5000 mg | ORAL_TABLET | Freq: Three times a day (TID) | ORAL | 0 refills | Status: DC

## 2016-05-31 NOTE — Progress Notes (Signed)
REFERRING PROVIDER: [name], MD [place], [place] [address]  PRIMARY PROVIDER:  Hoyt Koch, MD  PRIMARY REASON FOR VISIT:  1. Malignant neoplasm of left breast in female, estrogen receptor positive, unspecified site of breast ([place])      HISTORY OF PRESENT ILLNESS:   Ms. Sally Smith, a 56 y.o. female, was seen for a [facility] cancer genetics consultation at the request of Dr. Burr Medico due to a personal and family history of cancer.  [name] presents to clinic today to discuss the possibility of a hereditary predisposition to cancer, genetic testing, and to further clarify her future cancer risks, as well as potential cancer risks for family members. She is accompanied to her appointment by her husband.  In April 2018, at the age of 15, [name] was diagnosed with ER/PR+ HER2- invasive lobular carcinoma of the left breast. Treatment is pending. [name] also reports that she had cervical cancer at age 89, treated with surgery; basal cell carcinoma on her face around the age of 80; melanoma on her leg around the age of 54; and a benign breast biopsy in October 2008.   CANCER HISTORY:  Oncology History   Cancer Staging Malignant neoplasm of upper-inner quadrant of left breast in female, estrogen receptor positive (Sally Smith) Staging form: Breast, AJCC 8th Edition - Clinical stage from 05/22/2016: Stage IA (cT1a, cN0, cM0, G2, ER: Positive, PR: Positive, HER2: Negative) - Signed by [name], MD on 05/29/2016       Malignant neoplasm of upper-inner quadrant of left breast in female, estrogen receptor positive (Sally Smith)   05/10/2016 Mammogram    Bilateral screening mammogram on 05/10/16 showed a possible distortion with calcifications in the left breast.      05/16/2016 Mammogram    Diagnostic mammogram and US showed Persistent distortion in the upper inner quadrant of the left breast possibly correlating with the sonographic area of acoustic shadowing, measuring about 7m. [place]  left axilla (-)      05/22/2016 Receptors her2    ER 100%, PR 70%+, Ki67 5%      05/22/2016 Initial Biopsy    Diagnosis Breast, left, needle core biopsy, upper inner - INVASIVE AND IN SITU LOBULAR CARCINOMA, G1-2      05/22/2016 Initial Diagnosis    Malignant neoplasm of upper-inner quadrant of left breast in female, estrogen receptor positive ([place]        HORMONAL RISK FACTORS:  Menarche was at age 56  First live birth at age 56  OCP use for approximately not evaluated.  Ovaries intact: yes.  Hysterectomy: no.  Menopausal status: postmenopausal.  HRT use: 0 years. Colonoscopy: yes; normal. Mammogram within the last year: yes. Number of breast biopsies: 2. Up to date with pelvic exams:  yes. Any excessive radiation exposure in the past:  no  Past Medical History:  Diagnosis Date  . Skin cancer     Past Surgical History:  Procedure Laterality Date  . TONSILLECTOMY      Social History   Social History  . Marital status: Married    Spouse name: N/A  . Number of children: N/A  . Years of education: N/A   Social History Main Topics  . Smoking status: Never Smoker  . Smokeless tobacco: Never Used  . Alcohol use No  . Drug use: No  . Sexual activity: Not on file   Other Topics Concern  . Not on file   Social History Narrative  . No narrative on file  FAMILY HISTORY:  We obtained a detailed, 4-generation family history.  Significant diagnoses are listed below: Family History  Problem Relation Age of Onset  . Breast cancer Mother 60    Contralateral breast cancer a few years later that metastasized.   Ms. Bruhn has a son, age 50, and daughter, age 64, who are without tumors or cancers. Ms. Treloar has one full-brother and one full-sister. Her sister is 38, her brother 7. She also has two maternal half-sisters, ages 53 and 41. There are no cancers in any of her siblings.  Ms. Perfetti mother was diagnosed with breast cancer at age 52 and underwent  mastectomy. A few years later, she was diagnosed with contralateral breast cancer and died at age 82 from metastatic breast cancer. Ms. Gulas mother had four brothers and one sister who lived to adulthood. None have histories of cancer. Ms. Ertl maternal grandmother died in her 50s with a liver lesion, though it is not clear if this was cancer. This grandmother's mother died at 64 with breast cancer that metastasized to her bones. Ms. Gomm maternal grandfather died at 41 from a heart attack.  There are no known cancers in Ms. Ghee's paternal family history. Ms. Perren father is 44 without cancers. He had three brothers. His mother lived into her 38s. There is no information about his father.  Ms. Reitz is unaware of previous family history of genetic testing for hereditary cancer risks. Patient's maternal and paternal ancestors are of Caucasian descent. There is no reported Ashkenazi Jewish ancestry. There is no known consanguinity.  GENETIC COUNSELING ASSESSMENT: Sally Smith is a 56 y.o. female with a personal and family history which is somewhat suggestive of a hereditary cancer syndrome and predisposition to cancer. We, therefore, discussed and recommended the following at today's visit.   DISCUSSION: We reviewed the characteristics, features and inheritance patterns of hereditary cancer syndromes. We also discussed genetic testing, including the appropriate family members to test, the process of testing, insurance coverage and turn-around-time for results. We discussed the implications of a negative, positive and/or variant of uncertain significant result. In order to get genetic test results in a timely manner so that Ms. Wagster can use these genetic test results for surgical decisions, we recommended Ms. Monforte pursue genetic testing for the 9-gene High/Moderate Breast-Risk STAT Panel offered by [facility]. If this test is negative, we then recommend Ms. Dietrick pursue reflex genetic  testing to the 46-gene Common Hereditary Cancer Panel.   Based on Ms. Colbath's personal and family history of cancer, she meets medical criteria for genetic testing. Despite that she meets criteria, she may still have an out of pocket cost. We discussed that if her out of pocket cost for testing is over $100, the laboratory will call and confirm whether she wants to proceed with testing.  If the out of pocket cost of testing is less than $100 she will be billed by the genetic testing laboratory.   PLAN: After considering the risks, benefits, and limitations, Ms. Tarquinio  provided informed consent to pursue genetic testing and the blood sample was sent to [facility] for analysis of the 9-gene High/Moderal Breast-Risk STAT Panel. Results should be available within approximately 2 weeks' time, at which point they will be disclosed by telephone to Ms. Moscato, as will any additional recommendations warranted by these results. Ms. Weyers will receive a summary of her genetic counseling visit and a copy of her results once available. This information will also be available in Epic.  Lastly, we encouraged Ms. Reifsteck to remain in contact with cancer genetics annually so that we can continuously update the family history and inform her of any changes in cancer genetics and testing that may be of benefit for this family.   Ms.  Brooke questions were answered to her satisfaction today. Our contact information was provided should additional questions or concerns arise. Thank you for the referral and allowing [place] to share in the care of your patient.   Mal Misty, MS, [facility] Certified Naval [email or web address] phone: [phone]  The patient was seen for a total of 45 minutes in face-to-face genetic counseling.  This patient was discussed with Drs. Magrinat, Lindi Adie and/or Burr Medico who agrees with the above.    _______________________________________________________________________ For  Office Staff:  Number of people involved in session: 2 Was an Intern/ student involved with case: no

## 2016-05-31 NOTE — Telephone Encounter (Signed)
  Oncology Nurse Navigator Documentation  Navigator Location: [facility] (05/31/16 1100)   )Navigator Encounter Type: [facility] Follow-up (05/31/16 1100)                         Barriers/Navigation Needs: No barriers at this time (05/31/16 1100)                          Time Spent with Patient: 15 (05/31/16 1100)

## 2016-06-01 ENCOUNTER — Telehealth: Payer: Self-pay | Admitting: Hematology

## 2016-06-01 ENCOUNTER — Encounter: Payer: Self-pay | Admitting: General Practice

## 2016-06-01 ENCOUNTER — Encounter: Payer: Self-pay | Admitting: Genetics

## 2016-06-01 NOTE — Telephone Encounter (Signed)
No LOS per 0

## 2016-06-01 NOTE — Progress Notes (Signed)
[place] Psychosocial Distress Screening Spiritual Care  LVM for Mardene Celeste following Breast Multidisciplinary Clinic to introduce [place] team/resources, reviewing distress screen per protocol.  The patient scored a [unspecified] on the Psychosocial Distress Thermometer which indicates [unspecified] distress.  Encouraged her to return call; plan to assess further for distress and other psychosocial needs at that time.   ONCBCN DISTRESS SCREENING 06/01/2016  Emotional problem type Nervousness/Anxiety;Adjusting to illness;Isolation/feeling alone  Physical Problem type Pain;Sleep/insomnia  Referral to support programs Yes    Follow up needed: Yes.  Plan to phone patient if she does not return call next week. She has full packet of [place] team/resource information, but please also page if immediate needs arise. Thank you.   [place], [place], [facility] Pager [phone] Voicemail [phone]

## 2016-06-02 ENCOUNTER — Ambulatory Visit (HOSPITAL_COMMUNITY)
Admission: RE | Admit: 2016-06-02 | Discharge: 2016-06-02 | Disposition: A | Discharge status: Home / Self Care | Payer: BLUE CROSS/BLUE SHIELD | Source: Ambulatory Visit | Attending: General Surgery | Admitting: General Surgery

## 2016-06-02 DIAGNOSIS — C50212 Malignant neoplasm of upper-inner quadrant of left female breast: Secondary | ICD-10-CM | POA: Insufficient documentation

## 2016-06-02 DIAGNOSIS — Z170 Estrogen receptor positive status [ER+]: Secondary | ICD-10-CM | POA: Insufficient documentation

## 2016-06-02 MED ORDER — GADOBENATE DIMEGLUMINE 529 MG/ML IV SOLN
15.0000 mL | Freq: Once | INTRAVENOUS | Status: AC | PRN
  Administered 2016-06-02: 12 mL via INTRAVENOUS

## 2016-06-05 ENCOUNTER — Encounter: Payer: Self-pay | Admitting: General Practice

## 2016-06-05 NOTE — Progress Notes (Signed)
[place] Spiritual Care Note  Reached Sally Smith by phone for f/u support.  She was in good spirits, looking forward to a quick getaway with her husband (now that he's free after tax season) to celebrate their 28th anniversary. She also reported great appreciation for whole [facility] team, esp the [facility] volunteer who greeted them and set them at ease.   Encouraged her to take advantage of support programming and [facility] resources as desired, and to call anyone from the Support Team with questions or needs. Pt was very receptive, particularly for the normalization of feelings and availability of creative support.   [place], [place], [facility] Pager [phone] Voicemail [phone]

## 2016-06-07 ENCOUNTER — Telehealth: Payer: Self-pay | Admitting: Genetics

## 2016-06-11 ENCOUNTER — Other Ambulatory Visit: Payer: Self-pay | Admitting: General Surgery

## 2016-06-11 ENCOUNTER — Telehealth: Payer: Self-pay | Admitting: General Surgery

## 2016-06-11 DIAGNOSIS — C50212 Malignant neoplasm of upper-inner quadrant of left female breast: Secondary | ICD-10-CM

## 2016-06-11 NOTE — Telephone Encounter (Signed)
Reviewed that the 9-gene high/moderate breast risk STAT panel performed by [facility] was negative for mutations. Will reflex to remaining genes included on [facility]'s 46-gene Common Hereditary Cancer panel. Will call patient when remaining results are available. Final risk assessment and documentation will be made at that time. A portion of her STAT panel results are included below for reference.  

## 2016-06-11 NOTE — Telephone Encounter (Signed)
Discussed MR with patient.  Also reviewed negative genetics.    Will set up MRI guided biopsy.

## 2016-06-12 ENCOUNTER — Other Ambulatory Visit: Payer: Self-pay | Admitting: General Surgery

## 2016-06-12 DIAGNOSIS — C50212 Malignant neoplasm of upper-inner quadrant of left female breast: Secondary | ICD-10-CM

## 2016-06-13 ENCOUNTER — Telehealth: Payer: Self-pay | Admitting: Genetics

## 2016-06-13 ENCOUNTER — Ambulatory Visit: Payer: Self-pay | Admitting: Genetics

## 2016-06-13 DIAGNOSIS — Z1379 Encounter for other screening for genetic and chromosomal anomalies: Secondary | ICD-10-CM

## 2016-06-18 ENCOUNTER — Encounter: Payer: Self-pay | Admitting: Genetics

## 2016-06-18 ENCOUNTER — Ambulatory Visit
Admission: RE | Admit: 2016-06-18 | Discharge: 2016-06-18 | Disposition: A | Discharge status: Home / Self Care | Payer: BLUE CROSS/BLUE SHIELD | Source: Ambulatory Visit | Attending: General Surgery | Admitting: General Surgery

## 2016-06-18 DIAGNOSIS — C50212 Malignant neoplasm of upper-inner quadrant of left female breast: Secondary | ICD-10-CM

## 2016-06-18 DIAGNOSIS — Z1379 Encounter for other screening for genetic and chromosomal anomalies: Secondary | ICD-10-CM

## 2016-06-18 HISTORY — DX: Encounter for other screening for genetic and chromosomal anomalies: Z13.79

## 2016-06-18 MED ORDER — GADOBENATE DIMEGLUMINE 529 MG/ML IV SOLN
12.0000 mL | Freq: Once | INTRAVENOUS | Status: AC | PRN
  Administered 2016-06-18: 12 mL via INTRAVENOUS

## 2016-06-18 NOTE — Telephone Encounter (Signed)
Reviewed that reflex germline genetic testing revealed no pathogenic mutations. This is considered to be a negative result. Testing was performed through [facility]'s 46-gene Common Hereditary Cancers Panel. [facility]'s Common Hereditary Cancers Panel includes analysis of the following 46 genes: APC, ATM, AXIN2, BARD1, BMPR1A, BRCA1, BRCA2, BRIP1, CDH1, CDKN2A, CHEK2, CTNNA1, DICER1, EPCAM, GREM1, HOXB13, KIT, MEN1, MLH1, MSH2, MSH3, MSH6, MUTYH, NBN, NF1, NTHL1, PALB2, PDGFRA, PMS2, POLD1, POLE, PTEN, RAD50, RAD51C, RAD51D, SDHA, SDHB, SDHC, SDHD, SMAD4, SMARCA4, STK11, TP53, TSC1, TSC2, and VHL.  Previous testing through 9-gene High/Moderate Breast Risk STAT panel was also negative for mutations and was previously reported to Ms. Urey.   For more detailed discussion, please see genetic counseling documentation from 06/13/2016. Result report dated 06/11/2016.

## 2016-06-18 NOTE — Progress Notes (Signed)
HPI: Sally Smith was previously seen in the [place] [facility] due to a personal and family history of breast cancer and concerns regarding a hereditary predisposition to cancer. Please refer to our prior cancer genetics clinic note for more information regarding Sally Smith's medical, social and family histories, and our assessment and recommendations, at the time. Sally Smith recent genetic test results were disclosed to her, as were recommendations warranted by these results. These results and recommendations are discussed in more detail below.  CANCER HISTORY:  Oncology History   Cancer Staging Malignant neoplasm of upper-inner quadrant of left breast in female, estrogen receptor positive ([place]) Staging form: Breast, AJCC 8th Edition - Clinical stage from 05/22/2016: Stage IA (cT1a, cN0, cM0, G2, ER: Positive, PR: Positive, HER2: Negative) - Signed by [name], MD on 05/29/2016       Malignant neoplasm of upper-inner quadrant of left breast in female, estrogen receptor positive ([address])   05/10/2016 Mammogram    Bilateral screening mammogram on 05/10/16 showed a possible distortion with calcifications in the left breast.      05/16/2016 Mammogram    Diagnostic mammogram and US showed Persistent distortion in the upper inner quadrant of the left breast possibly correlating with the sonographic area of acoustic shadowing, measuring about 41m. UKoreaof left axilla (-)      05/22/2016 Receptors her2    ER 100%, PR 70%+, Ki67 5%      05/22/2016 Initial Biopsy    Diagnosis Breast, left, needle core biopsy, upper inner - INVASIVE AND IN SITU LOBULAR CARCINOMA, G1-2      05/22/2016 Initial Diagnosis    Malignant neoplasm of upper-inner quadrant of left breast in female, estrogen receptor positive ([place]      05/31/2016 Genetic Testing    Genetic counseling and testing for hereditary cancer syndromes performed on 05/31/2016. Results are negative for pathogenic mutations in 46 genes analyzed  by [facility]'s Common Hereditary Cancers Panel. Results are dated 06/11/2016. Genes analyzed include: APC, ATM, AXIN2, BARD1, BMPR1A, BRCA1, BRCA2, BRIP1, CDH1, CDKN2A, CHEK2, CTNNA1, DICER1, EPCAM, GREM1, HOXB13, KIT, MEN1, MLH1, MSH2, MSH3, MSH6, MUTYH, NBN, NF1, NTHL1, PALB2, PDGFRA, PMS2, POLD1, POLE, PTEN, RAD50, RAD51C, RAD51D, SDHA, SDHB, SDHC, SDHD, SMAD4, SMARCA4, STK11, TP53, TSC1, TSC2, and VHL.         FAMILY HISTORY:  We obtained a detailed, 4-generation family history.  Significant diagnoses are listed below: Family History  Problem Relation Age of Onset  . Breast cancer Mother 451   Contralateral breast cancer a few years later that metastasized.   Ms. BZingalehas a son, age 75026 and daughter, age [address]51 who are without tumors or cancers. Sally Smith one full-brother and one full-sister. Her sister is 652 her brother 585 She also has two maternal half-sisters, ages 523and 433 There are no cancers in any of her siblings.  Ms. BAckerleymother was diagnosed with breast cancer at age 751and underwent mastectomy. A few years later, she was diagnosed with contralateral breast cancer and died at age 9368from metastatic breast cancer. Sally Smith had four brothers and one sister who lived to adulthood. None have histories of cancer. Ms. BVandenboommaternal grandmother died in her 810swith a liver lesion, though it is not clear if this was cancer. This grandmother's mother died at 830with breast cancer that metastasized to her bones. Ms. BLowematernal grandfather died at 698from a heart attack.  There are no known cancers in Sally Smith paternal  family history. Sally Smith father is 19 without cancers. He had three brothers. His mother lived into her 74s. There is no information about his father.  Sally Smith is unaware of previous family history of genetic testing for hereditary cancer risks. Patient's maternal and paternal ancestors are of Caucasian descent. There is no reported  Ashkenazi Jewish ancestry. There is no known consanguinity.  GENETIC TEST RESULTS: Genetic testing performed through [facility]'s Common Hereditary Cancer Panel reported out on 06/11/2016 showed no deleterious mutations. [facility] Common Hereditary Cancers Panel includes analysis of the following 46 genes: APC, ATM, AXIN2, BARD1, BMPR1A, BRCA1, BRCA2, BRIP1, CDH1, CDKN2A, CHEK2, CTNNA1, DICER1, EPCAM, GREM1, HOXB13, KIT, MEN1, MLH1, MSH2, MSH3, MSH6, MUTYH, NBN, NF1, NTHL1, PALB2, PDGFRA, PMS2, POLD1, POLE, PTEN, RAD50, RAD51C, RAD51D, SDHA, SDHB, SDHC, SDHD, SMAD4, SMARCA4, STK11, TP53, TSC1, TSC2, and VHL.  Negative results for 9 high/moderate breast cancer risk genes was previously reported to Sally Smith. All genes analyzed by the 9-gene high/moderate breast risk and additional reflex testing are included in the gene list above.  The test report will be scanned into EPIC and will be located under the Molecular Pathology section of the Results Review tab.A portion of the result report is included below for reference.    We discussed with Sally Smith that since the current genetic testing is not perfect, it is possible there may be a gene mutation in one of these genes that current testing cannot detect, but that chance is small. We also discussed, that it is possible that another gene that has not yet been discovered, or that we have not yet tested, is responsible for the cancer diagnoses in the family. Therefore, important to remain in touch with cancer genetics in the future so that we can continue to offer Sally Smith the most up to date genetic testing.   CANCER SCREENING RECOMMENDATIONS: Given Sally Smith personal and family histories, we must interpret these negative results with some caution.  Families with features suggestive of hereditary risk for cancer tend to have multiple family members with cancer, diagnoses in multiple generations and diagnoses before the age of 42. Sally Smith family  exhibits some of these features. Thus this result may simply reflect our current inability to detect all mutations within these genes or there may be a different gene that has not yet been discovered or tested. However, since no causative or actionable mutations were identified, Ms. Lillibridge's breast cancer treatments and surveillance must be based on other aspects of her diagnosis and family history rather than these genetic testing results. Ms. Marion should discuss her treatment and surveillance plan with her referring physician and other treating providers.  RECOMMENDATIONS FOR FAMILY MEMBERS: Women in this family might be at some increased risk of developing cancer, over the general population risk, simply due to the family history of cancer. We recommended women in this family have a yearly mammogram beginning at age 24, or 66 years younger than the earliest onset of cancer, an annual clinical breast exam, and perform monthly breast self-exams. Women in this family should also have a gynecological exam as recommended by their primary provider. All family members should have a colonoscopy by age 36.  Specifically, we discussed that Ms. Wisner's daughter should alert her physicians to her family history of breast cancer and should begin annual mammograms no later than age 65 (50 years prior to the earliest breast cancer diagnosis in the family). If there is a history of younger breast cancer in her daughter's paternal  family history, Ms. Haaland's daughter may need to begin surveillance even earlier. At the time that she begins annual breast cancer surveillance, Ms. Lawhorn's daughter should discuss with her ordering provider whether there is a role for high-risk management and surveillance. Ms. Lovins sisters should also have similar discussions with their providers to establish personalized breast cancer screening plans.  FOLLOW-UP: Lastly, we discussed with Ms. Mello that cancer genetics is a rapidly  advancing field and it is possible that new genetic tests will be appropriate for her and/or her family members in the future. We encouraged her to remain in contact with cancer genetics on an annual basis so we can update her personal and family histories and let her know of advances in cancer genetics that may benefit this family.   Our contact number was provided. Ms. Moncayo questions were answered to her satisfaction, and she knows she is welcome to call us at anytime with additional questions or concerns.   Mal Misty, MS, [facility] Certified Naval [email or web address]

## 2016-06-19 ENCOUNTER — Telehealth: Payer: Self-pay | Admitting: General Surgery

## 2016-06-19 ENCOUNTER — Other Ambulatory Visit: Payer: Self-pay | Admitting: General Surgery

## 2016-06-19 DIAGNOSIS — Z170 Estrogen receptor positive status [ER+]: Principal | ICD-10-CM

## 2016-06-19 DIAGNOSIS — C50212 Malignant neoplasm of upper-inner quadrant of left female breast: Secondary | ICD-10-CM

## 2016-06-19 NOTE — Telephone Encounter (Signed)
Left message about second biopsy area being cancer.  Will still plan lumpectomy, but will be slightly larger area.

## 2016-06-27 ENCOUNTER — Other Ambulatory Visit: Payer: Self-pay | Admitting: General Surgery

## 2016-06-27 DIAGNOSIS — C50212 Malignant neoplasm of upper-inner quadrant of left female breast: Secondary | ICD-10-CM

## 2016-06-27 DIAGNOSIS — Z170 Estrogen receptor positive status [ER+]: Principal | ICD-10-CM

## 2016-06-28 ENCOUNTER — Encounter (HOSPITAL_BASED_OUTPATIENT_CLINIC_OR_DEPARTMENT_OTHER): Payer: Self-pay | Admitting: *Deleted

## 2016-06-28 NOTE — Progress Notes (Signed)
Bring all medications. Plans to come tomorrow before seed to pick up Boost.

## 2016-06-29 ENCOUNTER — Ambulatory Visit
Admission: RE | Admit: 2016-06-29 | Discharge: 2016-06-29 | Disposition: A | Discharge status: Home / Self Care | Payer: BLUE CROSS/BLUE SHIELD | Source: Ambulatory Visit | Attending: General Surgery | Admitting: General Surgery

## 2016-06-29 DIAGNOSIS — C50212 Malignant neoplasm of upper-inner quadrant of left female breast: Secondary | ICD-10-CM

## 2016-06-29 DIAGNOSIS — Z170 Estrogen receptor positive status [ER+]: Principal | ICD-10-CM

## 2016-06-29 NOTE — Progress Notes (Signed)
Boost drink given with instructions to complete by 0500 dos, pt verbalized understanding.

## 2016-07-03 DIAGNOSIS — C50919 Malignant neoplasm of unspecified site of unspecified female breast: Secondary | ICD-10-CM

## 2016-07-03 HISTORY — DX: Malignant neoplasm of unspecified site of unspecified female breast: C50.919

## 2016-07-03 HISTORY — PX: BREAST LUMPECTOMY: SHX2

## 2016-07-04 ENCOUNTER — Ambulatory Visit
Admission: RE | Admit: 2016-07-04 | Discharge: 2016-07-04 | Disposition: A | Discharge status: Home / Self Care | Payer: BLUE CROSS/BLUE SHIELD | Source: Ambulatory Visit | Attending: General Surgery | Admitting: General Surgery

## 2016-07-04 ENCOUNTER — Ambulatory Visit (HOSPITAL_BASED_OUTPATIENT_CLINIC_OR_DEPARTMENT_OTHER): Payer: BLUE CROSS/BLUE SHIELD | Admitting: Certified Registered"

## 2016-07-04 ENCOUNTER — Encounter (HOSPITAL_BASED_OUTPATIENT_CLINIC_OR_DEPARTMENT_OTHER)
Admission: RE | Disposition: A | Discharge status: Home / Self Care | Payer: Self-pay | Source: Ambulatory Visit | Attending: General Surgery

## 2016-07-04 ENCOUNTER — Encounter (HOSPITAL_COMMUNITY)
Admission: RE | Admit: 2016-07-04 | Discharge: 2016-07-04 | Disposition: A | Discharge status: Home / Self Care | Payer: BLUE CROSS/BLUE SHIELD | Source: Ambulatory Visit | Attending: General Surgery | Admitting: General Surgery

## 2016-07-04 ENCOUNTER — Encounter (HOSPITAL_BASED_OUTPATIENT_CLINIC_OR_DEPARTMENT_OTHER): Payer: Self-pay | Admitting: Certified Registered"

## 2016-07-04 ENCOUNTER — Ambulatory Visit (HOSPITAL_BASED_OUTPATIENT_CLINIC_OR_DEPARTMENT_OTHER)
Admission: RE | Admit: 2016-07-04 | Discharge: 2016-07-04 | Disposition: A | Discharge status: Home / Self Care | Payer: BLUE CROSS/BLUE SHIELD | Source: Ambulatory Visit | Attending: General Surgery | Admitting: General Surgery

## 2016-07-04 DIAGNOSIS — Z85828 Personal history of other malignant neoplasm of skin: Secondary | ICD-10-CM | POA: Insufficient documentation

## 2016-07-04 DIAGNOSIS — Z170 Estrogen receptor positive status [ER+]: Principal | ICD-10-CM

## 2016-07-04 DIAGNOSIS — Z803 Family history of malignant neoplasm of breast: Secondary | ICD-10-CM | POA: Diagnosis not present

## 2016-07-04 DIAGNOSIS — Z91040 Latex allergy status: Secondary | ICD-10-CM | POA: Diagnosis not present

## 2016-07-04 DIAGNOSIS — Z79899 Other long term (current) drug therapy: Secondary | ICD-10-CM | POA: Insufficient documentation

## 2016-07-04 DIAGNOSIS — F419 Anxiety disorder, unspecified: Secondary | ICD-10-CM | POA: Insufficient documentation

## 2016-07-04 DIAGNOSIS — Z91030 Bee allergy status: Secondary | ICD-10-CM | POA: Diagnosis not present

## 2016-07-04 DIAGNOSIS — C50212 Malignant neoplasm of upper-inner quadrant of left female breast: Secondary | ICD-10-CM

## 2016-07-04 DIAGNOSIS — Z882 Allergy status to sulfonamides status: Secondary | ICD-10-CM | POA: Insufficient documentation

## 2016-07-04 HISTORY — PX: BREAST LUMPECTOMY WITH RADIOACTIVE SEED AND SENTINEL LYMPH NODE BIOPSY: SHX6550

## 2016-07-04 HISTORY — DX: Anxiety disorder, unspecified: F41.9

## 2016-07-04 SURGERY — BREAST LUMPECTOMY WITH RADIOACTIVE SEED AND SENTINEL LYMPH NODE BIOPSY
Anesthesia: General | Site: Breast | Laterality: Left

## 2016-07-04 MED ORDER — PROPOFOL 500 MG/50ML IV EMUL
INTRAVENOUS | Status: AC
  Filled 2016-07-04: qty 50

## 2016-07-04 MED ORDER — FENTANYL CITRATE (PF) 100 MCG/2ML IJ SOLN
50.0000 ug | INTRAMUSCULAR | Status: DC | PRN
  Administered 2016-07-04: 50 ug via INTRAVENOUS
  Administered 2016-07-04: 100 ug via INTRAVENOUS

## 2016-07-04 MED ORDER — ONDANSETRON HCL 4 MG/2ML IJ SOLN
INTRAMUSCULAR | Status: DC | PRN
  Administered 2016-07-04: 4 mg via INTRAVENOUS

## 2016-07-04 MED ORDER — FENTANYL CITRATE (PF) 100 MCG/2ML IJ SOLN
INTRAMUSCULAR | Status: AC
  Filled 2016-07-04: qty 2

## 2016-07-04 MED ORDER — ONDANSETRON HCL 4 MG/2ML IJ SOLN: 4.0000 mg | Freq: Four times a day (QID) | INTRAMUSCULAR | Status: DC | PRN

## 2016-07-04 MED ORDER — LIDOCAINE HCL (CARDIAC) 20 MG/ML IV SOLN
INTRAVENOUS | Status: DC | PRN
  Administered 2016-07-04: 30 mg via INTRAVENOUS

## 2016-07-04 MED ORDER — GABAPENTIN 300 MG PO CAPS
ORAL_CAPSULE | ORAL | Status: AC
  Filled 2016-07-04: qty 1

## 2016-07-04 MED ORDER — SODIUM CHLORIDE 0.9 % IJ SOLN
INTRAMUSCULAR | Status: AC
  Filled 2016-07-04: qty 10

## 2016-07-04 MED ORDER — LACTATED RINGERS IV SOLN
INTRAVENOUS | Status: DC
  Administered 2016-07-04 (×2): via INTRAVENOUS

## 2016-07-04 MED ORDER — BUPIVACAINE HCL (PF) 0.25 % IJ SOLN
INTRAMUSCULAR | Status: AC
  Filled 2016-07-04: qty 30

## 2016-07-04 MED ORDER — SODIUM CHLORIDE 0.9% FLUSH: 3.0000 mL | INTRAVENOUS | Status: DC | PRN

## 2016-07-04 MED ORDER — PROPOFOL 10 MG/ML IV BOLUS
INTRAVENOUS | Status: DC | PRN
  Administered 2016-07-04: 150 mg via INTRAVENOUS

## 2016-07-04 MED ORDER — ACETAMINOPHEN 650 MG RE SUPP: 650.0000 mg | RECTAL | Status: DC | PRN

## 2016-07-04 MED ORDER — METHYLENE BLUE 0.5 % INJ SOLN
INTRAVENOUS | Status: AC
Start: 2016-07-04 — End: 2016-07-04
  Filled 2016-07-04: qty 10

## 2016-07-04 MED ORDER — OXYCODONE HCL 5 MG PO TABS: 5.0000 mg | ORAL_TABLET | ORAL | Status: DC | PRN

## 2016-07-04 MED ORDER — LIDOCAINE-EPINEPHRINE (PF) 1 %-1:200000 IJ SOLN
INTRAMUSCULAR | Status: AC
  Filled 2016-07-04: qty 30

## 2016-07-04 MED ORDER — ONDANSETRON HCL 4 MG/2ML IJ SOLN
INTRAMUSCULAR | Status: AC
  Filled 2016-07-04: qty 2

## 2016-07-04 MED ORDER — MIDAZOLAM HCL 2 MG/2ML IJ SOLN
1.0000 mg | INTRAMUSCULAR | Status: DC | PRN
  Administered 2016-07-04: 2 mg via INTRAVENOUS

## 2016-07-04 MED ORDER — LIDOCAINE-EPINEPHRINE (PF) 1 %-1:200000 IJ SOLN
INTRAMUSCULAR | Status: DC | PRN
  Administered 2016-07-04: 20 mL via INTRAMUSCULAR

## 2016-07-04 MED ORDER — LIDOCAINE 2% (20 MG/ML) 5 ML SYRINGE
INTRAMUSCULAR | Status: AC
  Filled 2016-07-04: qty 5

## 2016-07-04 MED ORDER — ONDANSETRON HCL 4 MG/2ML IJ SOLN
INTRAMUSCULAR | Status: AC
  Filled 2016-07-04: qty 4

## 2016-07-04 MED ORDER — BUPIVACAINE-EPINEPHRINE (PF) 0.5% -1:200000 IJ SOLN
INTRAMUSCULAR | Status: DC | PRN
  Administered 2016-07-04: 25 mL via PERINEURAL

## 2016-07-04 MED ORDER — SCOPOLAMINE 1 MG/3DAYS TD PT72: 1.0000 | MEDICATED_PATCH | Freq: Once | TRANSDERMAL | Status: DC | PRN

## 2016-07-04 MED ORDER — HYDROMORPHONE HCL 1 MG/ML IJ SOLN
INTRAMUSCULAR | Status: AC
  Filled 2016-07-04: qty 1

## 2016-07-04 MED ORDER — CHLORHEXIDINE GLUCONATE CLOTH 2 % EX PADS: 6.0000 | MEDICATED_PAD | Freq: Once | CUTANEOUS | Status: DC

## 2016-07-04 MED ORDER — OXYCODONE HCL 5 MG PO TABS
ORAL_TABLET | ORAL | Status: AC
  Filled 2016-07-04: qty 1

## 2016-07-04 MED ORDER — ACETAMINOPHEN 500 MG PO TABS
1000.0000 mg | ORAL_TABLET | ORAL | Status: AC
  Administered 2016-07-04: 1000 mg via ORAL

## 2016-07-04 MED ORDER — DEXAMETHASONE SODIUM PHOSPHATE 10 MG/ML IJ SOLN
INTRAMUSCULAR | Status: AC
  Filled 2016-07-04: qty 1

## 2016-07-04 MED ORDER — OXYCODONE HCL 5 MG PO TABS: 5.0000 mg | ORAL_TABLET | ORAL | 0 refills | Status: DC | PRN

## 2016-07-04 MED ORDER — ACETAMINOPHEN 500 MG PO TABS
ORAL_TABLET | ORAL | Status: AC
  Filled 2016-07-04: qty 2

## 2016-07-04 MED ORDER — MIDAZOLAM HCL 2 MG/2ML IJ SOLN
INTRAMUSCULAR | Status: AC
  Filled 2016-07-04: qty 2

## 2016-07-04 MED ORDER — CEFAZOLIN SODIUM-DEXTROSE 2-4 GM/100ML-% IV SOLN
INTRAVENOUS | Status: AC
  Filled 2016-07-04: qty 100

## 2016-07-04 MED ORDER — TECHNETIUM TC 99M SULFUR COLLOID FILTERED
1.0000 | Freq: Once | INTRAVENOUS | Status: AC | PRN
  Administered 2016-07-04: 1 via INTRADERMAL

## 2016-07-04 MED ORDER — EPHEDRINE 5 MG/ML INJ
INTRAVENOUS | Status: AC
  Filled 2016-07-04: qty 10

## 2016-07-04 MED ORDER — OXYCODONE HCL 5 MG PO TABS
5.0000 mg | ORAL_TABLET | Freq: Once | ORAL | Status: AC | PRN
  Administered 2016-07-04: 5 mg via ORAL

## 2016-07-04 MED ORDER — HYDROMORPHONE HCL 1 MG/ML IJ SOLN
0.2500 mg | INTRAMUSCULAR | Status: DC | PRN
  Administered 2016-07-04 (×4): 0.5 mg via INTRAVENOUS

## 2016-07-04 MED ORDER — CEFAZOLIN SODIUM-DEXTROSE 2-4 GM/100ML-% IV SOLN
2.0000 g | INTRAVENOUS | Status: AC
  Administered 2016-07-04: 2 g via INTRAVENOUS

## 2016-07-04 MED ORDER — GABAPENTIN 300 MG PO CAPS
300.0000 mg | ORAL_CAPSULE | ORAL | Status: AC
  Administered 2016-07-04: 300 mg via ORAL

## 2016-07-04 MED ORDER — OXYCODONE HCL 5 MG/5ML PO SOLN: 5.0000 mg | Freq: Once | ORAL | Status: AC | PRN

## 2016-07-04 MED ORDER — SODIUM CHLORIDE 0.9% FLUSH: 3.0000 mL | Freq: Two times a day (BID) | INTRAVENOUS | Status: DC

## 2016-07-04 MED ORDER — SODIUM CHLORIDE 0.9 % IV SOLN: 250.0000 mL | INTRAVENOUS | Status: DC | PRN

## 2016-07-04 MED ORDER — DEXAMETHASONE SODIUM PHOSPHATE 4 MG/ML IJ SOLN
INTRAMUSCULAR | Status: DC | PRN
  Administered 2016-07-04: 10 mg via INTRAVENOUS

## 2016-07-04 MED ORDER — ACETAMINOPHEN 325 MG PO TABS: 650.0000 mg | ORAL_TABLET | ORAL | Status: DC | PRN

## 2016-07-04 SURGICAL SUPPLY — 65 items
BINDER BREAST LRG (GAUZE/BANDAGES/DRESSINGS) IMPLANT
BINDER BREAST MEDIUM (GAUZE/BANDAGES/DRESSINGS) ×3 IMPLANT
BINDER BREAST XLRG (GAUZE/BANDAGES/DRESSINGS) IMPLANT
BINDER BREAST XXLRG (GAUZE/BANDAGES/DRESSINGS) IMPLANT
BLADE SURG 10 STRL SS (BLADE) ×3 IMPLANT
BLADE SURG 15 STRL LF DISP TIS (BLADE) IMPLANT
BLADE SURG 15 STRL SS (BLADE)
BNDG COHESIVE 4X5 TAN STRL (GAUZE/BANDAGES/DRESSINGS) ×3 IMPLANT
CANISTER SUC SOCK COL 7IN (MISCELLANEOUS) IMPLANT
CANISTER SUCT 1200ML W/VALVE (MISCELLANEOUS) ×3 IMPLANT
CHLORAPREP W/TINT 26ML (MISCELLANEOUS) ×3 IMPLANT
CLIP TI LARGE 6 (CLIP) ×3 IMPLANT
CLIP TI MEDIUM 6 (CLIP) ×9 IMPLANT
CLIP TI WIDE RED SMALL 6 (CLIP) IMPLANT
CLOSURE WOUND 1/2 X4 (GAUZE/BANDAGES/DRESSINGS) ×1
COVER MAYO STAND STRL (DRAPES) ×3 IMPLANT
COVER PROBE W GEL 5X96 (DRAPES) ×3 IMPLANT
DECANTER SPIKE VIAL GLASS SM (MISCELLANEOUS) IMPLANT
DERMABOND ADVANCED (GAUZE/BANDAGES/DRESSINGS) ×2
DERMABOND ADVANCED .7 DNX12 (GAUZE/BANDAGES/DRESSINGS) ×1 IMPLANT
DEVICE DUBIN W/COMP PLATE 8390 (MISCELLANEOUS) ×3 IMPLANT
DRAPE UTILITY XL STRL (DRAPES) ×3 IMPLANT
DRSG PAD ABDOMINAL 8X10 ST (GAUZE/BANDAGES/DRESSINGS) ×3 IMPLANT
ELECT COATED BLADE 2.86 ST (ELECTRODE) ×3 IMPLANT
ELECT REM PT RETURN 9FT ADLT (ELECTROSURGICAL) ×3
ELECTRODE REM PT RTRN 9FT ADLT (ELECTROSURGICAL) ×1 IMPLANT
GAUZE SPONGE 4X4 12PLY STRL LF (GAUZE/BANDAGES/DRESSINGS) ×3 IMPLANT
GLOVE BIO SURGEON STRL SZ 6 (GLOVE) IMPLANT
GLOVE BIOGEL PI IND STRL 6.5 (GLOVE) ×1 IMPLANT
GLOVE BIOGEL PI IND STRL 7.0 (GLOVE) ×1 IMPLANT
GLOVE BIOGEL PI INDICATOR 6.5 (GLOVE) ×2
GLOVE BIOGEL PI INDICATOR 7.0 (GLOVE) ×2
GLOVE SURG SS PI 6.0 STRL IVOR (GLOVE) ×6 IMPLANT
GLOVE SURG SS PI 6.5 STRL IVOR (GLOVE) ×3 IMPLANT
GOWN STRL REUS W/ TWL LRG LVL3 (GOWN DISPOSABLE) ×1 IMPLANT
GOWN STRL REUS W/TWL 2XL LVL3 (GOWN DISPOSABLE) ×3 IMPLANT
GOWN STRL REUS W/TWL LRG LVL3 (GOWN DISPOSABLE) ×2
KIT MARKER MARGIN INK (KITS) ×3 IMPLANT
LIGHT WAVEGUIDE WIDE FLAT (MISCELLANEOUS) ×3 IMPLANT
NDL SAFETY ECLIPSE 18X1.5 (NEEDLE) IMPLANT
NEEDLE HYPO 18GX1.5 SHARP (NEEDLE)
NEEDLE HYPO 25X1 1.5 SAFETY (NEEDLE) ×3 IMPLANT
NS IRRIG 1000ML POUR BTL (IV SOLUTION) ×3 IMPLANT
PACK BASIN DAY SURGERY FS (CUSTOM PROCEDURE TRAY) ×3 IMPLANT
PACK UNIVERSAL I (CUSTOM PROCEDURE TRAY) ×3 IMPLANT
PENCIL BUTTON HOLSTER BLD 10FT (ELECTRODE) ×3 IMPLANT
SLEEVE SCD COMPRESS KNEE MED (MISCELLANEOUS) ×3 IMPLANT
SPONGE LAP 18X18 X RAY DECT (DISPOSABLE) ×6 IMPLANT
STAPLER VISISTAT 35W (STAPLE) IMPLANT
STOCKINETTE IMPERVIOUS LG (DRAPES) ×3 IMPLANT
STRIP CLOSURE SKIN 1/2X4 (GAUZE/BANDAGES/DRESSINGS) ×2 IMPLANT
SUT ETHILON 2 0 FS 18 (SUTURE) IMPLANT
SUT MNCRL AB 4-0 PS2 18 (SUTURE) ×6 IMPLANT
SUT MON AB 5-0 PS2 18 (SUTURE) IMPLANT
SUT SILK 2 0 SH (SUTURE) IMPLANT
SUT VIC AB 2-0 SH 27 (SUTURE) ×2
SUT VIC AB 2-0 SH 27XBRD (SUTURE) ×1 IMPLANT
SUT VIC AB 3-0 SH 27 (SUTURE) ×6
SUT VIC AB 3-0 SH 27X BRD (SUTURE) ×3 IMPLANT
SYR CONTROL 10ML LL (SYRINGE) ×3 IMPLANT
TOWEL OR 17X24 6PK STRL BLUE (TOWEL DISPOSABLE) ×3 IMPLANT
TOWEL OR NON WOVEN STRL DISP B (DISPOSABLE) IMPLANT
TUBE CONNECTING 20'X1/4 (TUBING) ×1
TUBE CONNECTING 20X1/4 (TUBING) ×2 IMPLANT
YANKAUER SUCT BULB TIP NO VENT (SUCTIONS) ×3 IMPLANT

## 2016-07-04 NOTE — Op Note (Addendum)
Left Breast Bracketed Radioactive seed localized lumpectomy and sentinel lymph node biopsy  Indications: This patient presents with history of left breast cancer, cT2N0, upper inner quadrant, ER/PR+ Her 2 -  Pre-operative Diagnosis: left breast cancer  Post-operative Diagnosis: Same  Surgeon: Stark Klein   Anesthesia: General endotracheal anesthesia  ASA Class: 2  Procedure Details  The patient was seen in the Holding Room. The risks, benefits, complications, treatment options, and expected outcomes were discussed with the patient. The possibilities of bleeding, infection, the need for additional procedures, failure to diagnose a condition, and creating a complication requiring transfusion or operation were discussed with the patient. The patient concurred with the proposed plan, giving informed consent.  The site of surgery properly noted/marked. The patient was taken to Operating Room # 1, identified, and the procedure verified as Left Breast bracketed seed localized Lumpectomy with sentinel lymph node biopsy. A Time Out was held and the above information confirmed.  The left arm, breast, and chest were prepped and draped in standard fashion. The lumpectomy was performed by creating a circumareolar incision over the upper nipple just inferior to the previously placed radioactive seeds.  Dissection was carried down to around the point of maximum signal intensity. The cautery was used to perform the dissection.  Hemostasis was achieved with cautery. The edges of the cavity were marked with large clips, with one each medial, lateral, inferior and superior, and two clips posteriorly.   The specimen was inked with the margin marker paint kit.    Specimen radiography confirmed inclusion of the mammographic lesion, the clips, and the seeds.  The background signal in the breast was zero.  The medial and superior portion of the breast was freed up a little to minimize the defect at the lumpectomy site.   The wound was irrigated and closed with 3-0 vicryl in layers and 4-0 monocryl subcuticular suture.    Using a hand-held gamma probe, left axillary sentinel nodes were identified transcutaneously.  An oblique incision was created below the axillary hairline.  Dissection was carried through the clavipectoral fascia.  Three level 2 (deep) axillary sentinel nodes were removed.  Counts per second were 170, 230, and 190.    The background count was 5 cps.  The wound was irrigated.  Hemostasis was achieved with cautery.  The axillary incision was closed with a 3-0 vicryl deep dermal interrupted sutures and a 4-0 monocryl subcuticular closure.    Sterile dressings were applied. At the end of the operation, all sponge, instrument, and needle counts were correct.  Findings: grossly clear surgical margins and no adenopathy, posterior margin is pectoralis and anterior margin is skin.    Estimated Blood Loss:  min         Specimens: left breast lumpectomy and three left axillary sentinel lymph nodes.             Complications:  None; patient tolerated the procedure well.         Disposition: PACU - hemodynamically stable.         Condition: stable

## 2016-07-04 NOTE — Anesthesia Preprocedure Evaluation (Signed)
Anesthesia Evaluation  Patient identified by MRN, date of birth, ID band Patient awake    Reviewed: Allergy & Precautions, H&P , NPO status , Patient's Chart, lab work & pertinent test results  Airway Mallampati: II   Neck ROM: full    Dental   Pulmonary neg pulmonary ROS,    breath sounds clear to auscultation       Cardiovascular negative cardio ROS   Rhythm:regular Rate:Normal     Neuro/Psych PSYCHIATRIC DISORDERS Anxiety    GI/Hepatic   Endo/Other    Renal/GU      Musculoskeletal   Abdominal   Peds  Hematology   Anesthesia Other Findings   Reproductive/Obstetrics                             Anesthesia Physical Anesthesia Plan  ASA: II  Anesthesia Plan: General   Post-op Pain Management: GA combined w/ Regional for post-op pain   Induction: Intravenous  Airway Management Planned: LMA  Additional Equipment:   Intra-op Plan:   Post-operative Plan:   Informed Consent: I have reviewed the patients History and Physical, chart, labs and discussed the procedure including the risks, benefits and alternatives for the proposed anesthesia with the patient or authorized representative who has indicated his/her understanding and acceptance.     Plan Discussed with: CRNA, Anesthesiologist and Surgeon  Anesthesia Plan Comments:         Anesthesia Quick Evaluation

## 2016-07-04 NOTE — Anesthesia Procedure Notes (Signed)
Procedure Name: LMA Insertion Date/Time: 07/04/2016 9:24 AM Performed by: Braddock Servellon D Pre-anesthesia Checklist: Patient identified, Emergency Drugs available, Suction available and Patient being monitored Patient Re-evaluated:Patient Re-evaluated prior to inductionOxygen Delivery Method: Circle system utilized Preoxygenation: Pre-oxygenation with 100% oxygen Intubation Type: IV induction Ventilation: Mask ventilation without difficulty LMA: LMA inserted LMA Size: 3.0 Number of attempts: 1 Airway Equipment and Method: Bite block Placement Confirmation: positive ETCO2 Tube secured with: Tape Dental Injury: Teeth and Oropharynx as per pre-operative assessment

## 2016-07-04 NOTE — Discharge Instructions (Addendum)
°Post Anesthesia Home Care Instructions ° °Activity: °Get plenty of rest for the remainder of the day. A responsible individual must stay with you for 24 hours following the procedure.  °For the next 24 hours, DO NOT: °-Drive a car °-Operate machinery °-Drink alcoholic beverages °-Take any medication unless instructed by your physician °-Make any legal decisions or sign important papers. ° °Meals: °Start with liquid foods such as gelatin or soup. Progress to regular foods as tolerated. Avoid greasy, spicy, heavy foods. If nausea and/or vomiting occur, drink only clear liquids until the nausea and/or vomiting subsides. Call your physician if vomiting continues. ° °Special Instructions/Symptoms: °Your throat may feel dry or sore from the anesthesia or the breathing tube placed in your throat during surgery. If this causes discomfort, gargle with warm salt water. The discomfort should disappear within 24 hours. ° °If you had a scopolamine patch placed behind your ear for the management of post- operative nausea and/or vomiting: ° °1. The medication in the patch is effective for 72 hours, after which it should be removed.  Wrap patch in a tissue and discard in the trash. Wash hands thoroughly with soap and water. °2. You may remove the patch earlier than 72 hours if you experience unpleasant side effects which may include dry mouth, dizziness or visual disturbances. °3. Avoid touching the patch. Wash your hands with soap and water after contact with the patch. °   °[facility] °Office Phone Number [phone] ° °BREAST BIOPSY/ PARTIAL MASTECTOMY: POST OP INSTRUCTIONS ° °Always review your discharge instruction sheet given to you by the facility where your surgery was performed. ° °IF YOU HAVE DISABILITY OR FAMILY LEAVE FORMS, YOU MUST BRING THEM TO THE OFFICE FOR PROCESSING.  DO NOT GIVE THEM TO YOUR DOCTOR. ° °1. A prescription for pain medication may be given to you upon discharge.  Take your pain  medication as prescribed, if needed.  If narcotic pain medicine is not needed, then you may take acetaminophen (Tylenol) or ibuprofen (Advil) as needed. °2. Take your usually prescribed medications unless otherwise directed °3. If you need a refill on your pain medication, please contact your pharmacy.  They will contact our office to request authorization.  Prescriptions will not be filled after 5pm or on week-ends. °4. You should eat very light the first 24 hours after surgery, such as soup, crackers, pudding, etc.  Resume your normal diet the day after surgery. °5. Most patients will experience some swelling and bruising in the breast.  Ice packs and a good support bra will help.  Swelling and bruising can take several days to resolve.  °6. It is common to experience some constipation if taking pain medication after surgery.  Increasing fluid intake and taking a stool softener will usually help or prevent this problem from occurring.  A mild laxative (Milk of Magnesia or Miralax) should be taken according to package directions if there are no bowel movements after 48 hours. °7. Unless discharge instructions indicate otherwise, you may remove your bandages 48 hours after surgery, and you may shower at that time.  You may have steri-strips (small skin tapes) in place directly over the incision.  These strips should be left on the skin for 7-10 days.   Any sutures or staples will be removed at the office during your follow-up visit. °8. ACTIVITIES:  You may resume regular daily activities (gradually increasing) beginning the next day.  Wearing a good support bra or sports bra (or the breast binder) minimizes   pain and swelling.  You may have sexual intercourse when it is comfortable. °a. You may drive when you no longer are taking prescription pain medication, you can comfortably wear a seatbelt, and you can safely maneuver your car and apply brakes. °b. RETURN TO WORK:  __________1 week_______________ °9. You should  see your doctor in the office for a follow-up appointment approximately two weeks after your surgery.  Your doctor’s nurse will typically make your follow-up appointment when she calls you with your pathology report.  Expect your pathology report 2-3 business days after your surgery.  You may call to check if you do not hear from us after three days. ° ° °WHEN TO CALL YOUR DOCTOR: °1. Fever over 101.0 °2. Nausea and/or vomiting. °3. Extreme swelling or bruising. °4. Continued bleeding from incision. °5. Increased pain, redness, or drainage from the incision. ° °The clinic staff is available to answer your questions during regular business hours.  Please don’t hesitate to call and ask to speak to one of the nurses for clinical concerns.  If you have a medical emergency, go to the nearest emergency room or call 911.  A surgeon from [facility] is always on call at the hospital. ° °For further questions, please visit [email or web address]  ° °

## 2016-07-04 NOTE — H&P (Signed)
Sally Smith is an 56 y.o. female.   Chief Complaint: left breast cancer HPI:  Pt with cT2N0 left breast cancer.  Plan bracketed seed lumpectomy.  Past Medical History:  Diagnosis Date  . Anxiety   . Genetic testing 06/18/2016   Sally Smith underwent genetic counseling and testing for hereditary cancer syndromes on 05/31/2016. Her results were negative for mutations in all 46 genes analyzed by [facility]'s 46-gene Common Hereditary Cancers Panel. Genes analyzed include: APC, ATM, AXIN2, BARD1, BMPR1A, BRCA1, BRCA2, BRIP1, CDH1, CDKN2A, CHEK2, CTNNA1, DICER1, EPCAM, GREM1, HOXB13, KIT, MEN1, MLH1, MSH2, MSH3, MSH6, MUTYH, NBN,  . Skin cancer    left breast     Past Surgical History:  Procedure Laterality Date  . surgery on right leg    . TONSILLECTOMY      Family History  Problem Relation Age of Onset  . Breast cancer Mother 45       Contralateral breast cancer a few years later that metastasized.   Social History:  reports that she has never smoked. She has never used smokeless tobacco. She reports that she drinks about 0.6 oz of alcohol per week . She reports that she does not use drugs.  Allergies:  Allergies  Allergen Reactions  . Bee Venom Anaphylaxis  . Latex Itching    welps  . Sulfur Hives    itching    Medications Prior to Admission  Medication Sig Dispense Refill  . LORazepam (ATIVAN) 0.5 MG tablet Take 1 tablet (0.5 mg total) by mouth every 8 (eight) hours. 30 tablet 0  . Travoprost, BAK Free, (TRAVATAN) 0.004 % SOLN ophthalmic solution 1 drop at bedtime.    . Zolpidem Tartrate (AMBIEN PO) Take by mouth as needed.      No results found for this or any previous visit (from the past 48 hour(s)). No results found.  Review of Systems  All other systems reviewed and are negative.   Blood pressure 125/61, pulse 97, resp. rate 12, height '5\' 3"'$  (1.6 m), weight 60.4 kg (133 lb 3.2 oz), last menstrual period 11/29/2006, SpO2 100 %. Physical Exam  Constitutional:  She is oriented to person, place, and time. She appears well-developed and well-nourished. No distress.  HENT:  Head: Normocephalic and atraumatic.  Eyes: Conjunctivae are normal. Pupils are equal, round, and reactive to light. No scleral icterus.  Cardiovascular: Normal rate.   Respiratory: Effort normal. No respiratory distress.  GI: Soft.  Musculoskeletal: Normal range of motion. She exhibits no edema or tenderness.  Neurological: She is alert and oriented to person, place, and time.  Skin: Skin is warm and dry. No rash noted. She is not diaphoretic. No erythema. No pallor.  Psychiatric: She has a normal mood and affect. Her behavior is normal. Judgment and thought content normal.     Assessment/Plan Left breast cancer Bracketed lumpectomy with sentinel lymph node biopsy.  Reviewed risks and benefits.   Pt agrees to proceed. Seed checked with probe.    Sally Klein, MD 07/04/2016, 9:08 AM

## 2016-07-04 NOTE — Anesthesia Postprocedure Evaluation (Signed)
Anesthesia Post Note  Patient: Sally Smith  Procedure(s) Performed: Procedure(s) (LRB): LEFT BREAST LUMPECTOMY WITH RADIOACTIVE SEED X 2 AND SENTINEL LYMPH NODE BIOPSY (Left)  Patient location during evaluation: PACU Anesthesia Type: General and Regional Level of consciousness: awake and alert Pain management: pain level controlled Vital Signs Assessment: post-procedure vital signs reviewed and stable Respiratory status: spontaneous breathing, nonlabored ventilation, respiratory function stable and patient connected to nasal cannula oxygen Cardiovascular status: blood pressure returned to baseline and stable Postop Assessment: no signs of nausea or vomiting Anesthetic complications: no       Last Vitals:  Vitals:   07/04/16 1245 07/04/16 1306  BP: 140/67 (!) 144/75  Pulse: 95 89  Resp: 12 20  Temp:      Last Pain:  Vitals:   07/04/16 1306  PainSc: 6                  Montez Hageman

## 2016-07-04 NOTE — Anesthesia Procedure Notes (Signed)
Anesthesia Regional Block: Pectoralis block   Pre-Anesthetic Checklist: ,, timeout performed, Correct Patient, Correct Site, Correct Laterality, Correct Procedure, Correct Position, site marked, Risks and benefits discussed,  Surgical consent,  Pre-op evaluation,  At surgeon's request and post-op pain management  Laterality: Left  Prep: chloraprep       Needles:  Injection technique: Single-shot  Needle Type: Echogenic Needle     Needle Length: 9cm  Needle Gauge: 21     Additional Needles:   Procedures: ultrasound guided,,,,,,,,  Narrative:  Start time: 07/04/2016 8:11 AM End time: 07/04/2016 8:18 AM Injection made incrementally with aspirations every 5 mL.  Performed by: Personally  Anesthesiologist: Betsi Crespi  Additional Notes: Pt tolerated the procedure well.

## 2016-07-04 NOTE — Progress Notes (Signed)
Assisted Dr. Marcie Bal with left, ultrasound guided, pectoralis block. Side rails up, monitors on throughout procedure. See vital signs in flow sheet. Tolerated Procedure well.

## 2016-07-04 NOTE — Transfer of Care (Signed)
Immediate Anesthesia Transfer of Care Note  Patient: Sally Smith  Procedure(s) Performed: Procedure(s): LEFT BREAST LUMPECTOMY WITH RADIOACTIVE SEED X 2 AND SENTINEL LYMPH NODE BIOPSY (Left)  Patient Location: PACU  Anesthesia Type:GA combined with regional for post-op pain  Level of Consciousness: awake and patient cooperative  Airway & Oxygen Therapy: Patient Spontanous Breathing and Patient connected to face mask oxygen  Post-op Assessment: Report given to RN and Post -op Vital signs reviewed and stable  Post vital signs: Reviewed and stable  Last Vitals:  Vitals:   07/04/16 1111 07/04/16 1112  BP: 119/67   Pulse:  82  Resp:  15    Last Pain: There were no vitals filed for this visit.       Complications: No apparent anesthesia complications

## 2016-07-04 NOTE — Anesthesia Preprocedure Evaluation (Signed)
Anesthesia Evaluation Anesthesia Physical Anesthesia Plan Anesthesia Quick Evaluation  

## 2016-07-06 ENCOUNTER — Encounter (HOSPITAL_BASED_OUTPATIENT_CLINIC_OR_DEPARTMENT_OTHER): Payer: Self-pay | Admitting: General Surgery

## 2016-07-09 ENCOUNTER — Telehealth: Payer: Self-pay | Admitting: General Surgery

## 2016-07-09 NOTE — Telephone Encounter (Signed)
No answer on home or mobile phone.  No voicemail set up.  Anterior and posterior margin focally positive, but these are skin and pectoralis.  Would not plan to reexcise.  LN negative.

## 2016-07-10 ENCOUNTER — Telehealth: Payer: Self-pay | Admitting: *Deleted

## 2016-07-10 NOTE — Telephone Encounter (Signed)
Received order for oncotype testing requisition sent to pathology. Received by Varney Biles. PAC fax to [facility]

## 2016-07-17 ENCOUNTER — Telehealth: Payer: Self-pay | Admitting: *Deleted

## 2016-07-17 NOTE — Telephone Encounter (Signed)
FYI "Vickie with [employer] calling in reference to fax we received 07-13-2016 for medical necessity.  The form is incomplete.  I will fax this back for completion.  The two boxesfor chemotherapy information on page two need to be completed.  I will return fax to [phone]."

## 2016-07-17 NOTE — Telephone Encounter (Signed)
I spoke with [facility], she has faxed last week. Thanks.   [name] MD

## 2016-07-20 ENCOUNTER — Telehealth: Payer: Self-pay | Admitting: General Surgery

## 2016-07-20 NOTE — Telephone Encounter (Signed)
Discussed pathology with patient.  Anterior and posterior margins focally positive, but these are skin and muscle.  Nodes negative.  No additional surgery needed. Pt has appt with me on Monday.  Oncotype pending.

## 2016-07-27 ENCOUNTER — Telehealth: Payer: Self-pay | Admitting: *Deleted

## 2016-07-27 NOTE — Telephone Encounter (Signed)
Received oncotype score of 11/7%. Physician team notified.  Called pt with results, Discussed next steps. Pt wishes to have xrt at [facility]. Referral placed for Dr. Lisbeth Renshaw

## 2016-07-30 ENCOUNTER — Encounter: Payer: Self-pay | Admitting: Radiation Oncology

## 2016-07-30 NOTE — Progress Notes (Signed)
Location of Breast Cancer:Upper-inner quadrant of left breast in female  Histology per Pathology Report: 07-04-16  Diagnosis 1. Breast, lumpectomy, Left - INVASIVE AND IN SITU LOBULAR CARCINOMA, 2.8 CM. - INVASIVE CARCINOMA FOCALLY INVOLVES POSTERIOR AND ANTERIOR MARGINS. - LOBULAR CARCINOMA IN SITU FOCALLY LESS THAN 0.1 CM FROM POSTERIOR MARGIN - PREVIOUS BIOPSY SITE. 2. Lymph node, sentinel, biopsy, Left Axillary #1 - ONE BENIGN LYMPH NODE (0/1). 3. Lymph node, sentinel, biopsy, Left Axillary #2 - ONE BENIGN LYMPH NODE (0/1). 4. Lymph node, sentinel, biopsy, Left - ONE BENIGN LYMPH NODE (0/1). 5. Lymph node, sentinel, biopsy, Left Axillary #3 - ONE BENIGN LYMPH NODE (0/1). Microscopic Comment - INVASIVE AND IN SITU LOBULAR CARCINOMA, 2.8 CM. - INVASIVE CARCINOMA FOCALLY INVOLVES POSTERIOR AND ANTERIOR MARGINS. - LOBULAR CARCINOMA IN SITU FOCALLY LESS THAN 0.1 CM FROM POSTERIOR MARGIN - PREVIOUS BIOPSY SITE. 2. Lymph node, sentinel, biopsy, Left Axillary #1 - ONE BENIGN LYMPH NODE (0/1). 3. Lymph node, sentinel, biopsy, Left Axillary #2 - ONE BENIGN LYMPH NODE (0/1). 4. Lymph node, sentinel, biopsy, Left - ONE BENIGN LYMPH NODE (0/1). 5. Lymph node, sentinel, biopsy, Left Axillary #3 - ONE BENIGN LYMPH NODE (0/1).  Diagnosis 06-18-16 Breast, left, needle core biopsy, upper inner quadrant - INVASIVE MAMMARY CARCINOMA, SEE COMMENT. - MAMMARY CARCINOMA IN SITU.   Receptor Status: ER(100% +), PR (70% +), Her2-neu (-), Ki-(5%)  Did patient present with symptoms (if so, please note symptoms) or was this found on screening mammography?:Screening detected   Past/Anticipated interventions by surgeon, if any:Dr. Stark Klein  07-04-16 Breast, lumpectomy, Left,SLN, 06-18-16 Left breast biopsy  Past/Anticipated interventions by medical oncology, if any: Dr. Burr Medico Oncotype Dx test,genetic counseling  Lymphedema issues, if any: None  Pain issues, if any: 5/10 left breast taking  Oxycodone  SAFETY ISSUES:  Prior radiation? :No  Pacemaker/ICD? : No  Possible current pregnancy?: No  Is the patient on methotrexate?: No  Menarche: 9 :Menopause 48 Contraceptive: Post-menopause HRT: No GP: G3P2, one child miscarriageCurrent Complaints / other details:  56 year old married woman with two children, history of cervical cancer,skin cancer and basal cell  carcincoma   Wt Readings from Last 3 Encounters:  07/31/16 134 lb 9.6 oz (61.1 kg)  07/04/16 133 lb 3.2 oz (60.4 kg)  05/30/16 134 lb 1.6 oz (60.8 kg)  BP (!) 113/52   Pulse 79   Temp 97.7 F (36.5 C) (Oral)   Resp 16   Ht _0  (1.6 m)   Wt 134 lb 9.6 oz (61.1 kg)   LMP 11/29/2006 (Exact Date)   SpO2 100%   BMI 23.84 kg/m   Georgena Spurling, RN 07/30/2016,9:05 AM

## 2016-07-31 ENCOUNTER — Encounter (HOSPITAL_COMMUNITY): Payer: Self-pay

## 2016-07-31 ENCOUNTER — Ambulatory Visit
Admission: RE | Admit: 2016-07-31 | Discharge: 2016-07-31 | Disposition: A | Discharge status: Home / Self Care | Payer: BLUE CROSS/BLUE SHIELD | Source: Ambulatory Visit | Attending: Radiation Oncology | Admitting: Radiation Oncology

## 2016-07-31 ENCOUNTER — Encounter: Payer: Self-pay | Admitting: Radiation Oncology

## 2016-07-31 VITALS — BP 113/52 | HR 79 | Temp 97.7°F | Resp 16 | Ht 63.0 in | Wt 134.6 lb

## 2016-07-31 DIAGNOSIS — Z803 Family history of malignant neoplasm of breast: Secondary | ICD-10-CM | POA: Diagnosis not present

## 2016-07-31 DIAGNOSIS — F419 Anxiety disorder, unspecified: Secondary | ICD-10-CM | POA: Diagnosis not present

## 2016-07-31 DIAGNOSIS — Z510 Encounter for antineoplastic radiation therapy: Secondary | ICD-10-CM | POA: Diagnosis present

## 2016-07-31 DIAGNOSIS — Z888 Allergy status to other drugs, medicaments and biological substances status: Secondary | ICD-10-CM | POA: Diagnosis not present

## 2016-07-31 DIAGNOSIS — Z79899 Other long term (current) drug therapy: Secondary | ICD-10-CM | POA: Diagnosis not present

## 2016-07-31 DIAGNOSIS — Z9889 Other specified postprocedural states: Secondary | ICD-10-CM | POA: Diagnosis not present

## 2016-07-31 DIAGNOSIS — Z170 Estrogen receptor positive status [ER+]: Principal | ICD-10-CM

## 2016-07-31 DIAGNOSIS — C50212 Malignant neoplasm of upper-inner quadrant of left female breast: Secondary | ICD-10-CM | POA: Diagnosis not present

## 2016-07-31 DIAGNOSIS — Z85828 Personal history of other malignant neoplasm of skin: Secondary | ICD-10-CM | POA: Diagnosis not present

## 2016-07-31 DIAGNOSIS — Z8541 Personal history of malignant neoplasm of cervix uteri: Secondary | ICD-10-CM | POA: Diagnosis not present

## 2016-07-31 HISTORY — DX: Malignant neoplasm of unspecified site of left female breast: C50.912

## 2016-07-31 NOTE — Progress Notes (Signed)
Radiation Oncology         [phone] ________________________________  Name: Sally Smith MRN: [id number]  Date: 07/31/2016  DOB: Jun 27, 1960  NW:GNFAO, Sally Griffins, MD  Sally Merle, MD     REFERRING PHYSICIAN: [name], MD   DIAGNOSIS: The encounter diagnosis was Malignant neoplasm of upper-inner quadrant of left breast in female, estrogen receptor positive ([place]).   HISTORY OF PRESENT ILLNESS: Sally Smith is a 56 y.o. female who was originally seen in the multidisciplinary breast clinic for a new diagnosis of left breast cancer. The patient was found to have distortion of the left breast. Diagnostic imaging revealed a 6 mm lesion at the 11:30 position, and her axilla was negative for adenopathy. She underwent biopsy of the breast lesion on 05/22/16 which revealed a grad 1-2 invasive lobular carcinoma, ER/PR positive, HER2 negative, with a Ki 67 of 5%.   She subsequently underwent a breast MRI on 06/02/16 revealing an area of non mass enhancement spanning 3 cm, and 1.5 x 2 cm anterior to posteriorly. No adenopathy was seen. She underwent left lumpectomy and sentinel node evaluation on 07/04/16. This revealed a grade 1, invasive lobular carcinoma with LCIS. The invasive tumor measured 2.8 cm involving the anterior and posterior margin,and LCIS was noted to be less than 1 mm from the posterior margin. Of the 4 nodes sampled, none contained disease. Her excision was taken down to the pectoralis, and no additional resection is indicated per surgery. Her oncotype score was 11 and she is not planning on chemotherapy. She has also had genetic testing with [facility] panel that was negative in April as well. She comes today to discuss the options for adjuvant radiotherapy, and will begin adjuvant estrogen blockade as well.   PREVIOUS RADIATION THERAPY: No   PAST MEDICAL HISTORY:  Past Medical History:  Diagnosis Date  . Anxiety   . Cancer of left breast ([place])    [facility] genetic testing negative  on 05/31/16  . Genetic testing 06/18/2016   Sally Smith underwent genetic counseling and testing for hereditary cancer syndromes on 05/31/2016. Her results were negative for mutations in all 46 genes analyzed by [facility]'s 46-gene Common Hereditary Cancers Panel. Genes analyzed include: APC, ATM, AXIN2, BARD1, BMPR1A, BRCA1, BRCA2, BRIP1, CDH1, CDKN2A, CHEK2, CTNNA1, DICER1, EPCAM, GREM1, HOXB13, KIT, MEN1, MLH1, MSH2, MSH3, MSH6, MUTYH, NBN,  . Skin cancer    left breast        PAST SURGICAL HISTORY: Past Surgical History:  Procedure Laterality Date  . BREAST LUMPECTOMY WITH RADIOACTIVE SEED AND SENTINEL LYMPH NODE BIOPSY Left 07/04/2016   Procedure: LEFT BREAST LUMPECTOMY WITH RADIOACTIVE SEED X 2 AND SENTINEL LYMPH NODE BIOPSY;  Surgeon: Stark Klein, MD;  Location: [place];  Service: General;  Laterality: Left;  . surgery on right leg    . TONSILLECTOMY       FAMILY HISTORY:  Family History  Problem Relation Age of Onset  . Breast cancer Mother 30       Contralateral breast cancer a few years later that metastasized.     SOCIAL HISTORY:  reports that she has never smoked. She has never used smokeless tobacco. She reports that she drinks about 0.6 oz of alcohol per week . She reports that she does not use drugs. The patient is married and lives in [place], [place].    ALLERGIES: Bee venom; Latex; and Sulfur   MEDICATIONS:  Current Outpatient Prescriptions  Medication Sig Dispense Refill  . oxyCODONE (OXY IR/ROXICODONE) 5  MG immediate release tablet Take 1-2 tablets (5-10 mg total) by mouth every 4 (four) hours as needed for moderate pain, severe pain or breakthrough pain. 40 tablet 0  . Travoprost, BAK Free, (TRAVATAN) 0.004 % SOLN ophthalmic solution 1 drop at bedtime.    Marland Kitchen LORazepam (ATIVAN) 0.5 MG tablet Take 1 tablet (0.5 mg total) by mouth every 8 (eight) hours. (Patient not taking: Reported on 07/31/2016) 30 tablet 0  . Zolpidem Tartrate (AMBIEN PO) Take by  mouth as needed.     No current facility-administered medications for this encounter.      REVIEW OF SYSTEMS: On review of systems, the patient reports that she is doing well overall. She has noticed some tingling or burning in the medial upper arm and axilla. She also has some fullness along the chest wall and though she has intact ROM, she does have some tightness in the left axilla. She denies any chest pain, shortness of breath, cough, fevers, chills, night sweats, unintended weight changes. She denies any bowel or bladder disturbances, and denies abdominal pain, nausea or vomiting. She denies any new musculoskeletal or joint aches or pains. A complete review of systems is obtained and is otherwise negative.     PHYSICAL EXAM:  Wt Readings from Last 3 Encounters:  07/31/16 134 lb 9.6 oz (61.1 kg)  07/04/16 133 lb 3.2 oz (60.4 kg)  05/30/16 134 lb 1.6 oz (60.8 kg)   Temp Readings from Last 3 Encounters:  07/31/16 97.7 F (36.5 C) (Oral)  07/04/16 97.6 F (36.4 C)  05/30/16 97.7 F (36.5 C) (Oral)   BP Readings from Last 3 Encounters:  07/31/16 (!) 113/52  07/04/16 (!) 144/75  05/30/16 134/66   Pulse Readings from Last 3 Encounters:  07/31/16 79  07/04/16 89  05/30/16 75   Pain Assessment Pain Score: 5   In general this is a well appearing caucasian female in no acute distress. She's alert and oriented x4 and appropriate throughout the examination. Cardiopulmonary assessment is negative for acute distress and she exhibits normal effort. The left breast is examined and reveals well healing incision sites without separation, bleeding, or cellulitic changes. No edema of the LUE is noted, but there is some mild fullness along the chest wall.   ECOG = 0  0 - Asymptomatic (Fully active, able to carry on all predisease activities without restriction)  1 - Symptomatic but completely ambulatory (Restricted in physically strenuous activity but ambulatory and able to carry out work  of a light or sedentary nature. For example, light housework, office work)  2 - Symptomatic, <50% in bed during the day (Ambulatory and capable of all self care but unable to carry out any work activities. Up and about more than 50% of waking hours)  3 - Symptomatic, >50% in bed, but not bedbound (Capable of only limited self-care, confined to bed or chair 50% or more of waking hours)  4 - Bedbound (Completely disabled. Cannot carry on any self-care. Totally confined to bed or chair)  5 - Death   Eustace Pen MM, Creech RH, Tormey DC, et al. [phone]). "Toxicity and response criteria of the [facility]". [place] Oncol. 5 (6): 649-55    LABORATORY DATA:  Lab Results  Component Value Date   WBC 5.1 05/30/2016   HGB 14.2 05/30/2016   HCT 41.1 05/30/2016   MCV 99.7 05/30/2016   PLT 169 05/30/2016   Lab Results  Component Value Date   NA 140 05/30/2016  K 3.5 05/30/2016   CO2 27 05/30/2016   Lab Results  Component Value Date   ALT 35 05/30/2016   AST 24 05/30/2016   ALKPHOS 74 05/30/2016   BILITOT 0.59 05/30/2016      RADIOGRAPHY: Mm Breast Surgical Specimen  Result Date: 07/23/2016 CLINICAL DATA:  Evaluate specimen EXAM: SPECIMEN RADIOGRAPH OF THE LEFT BREAST COMPARISON:  Previous exam(s). FINDINGS: Status post excision of the left breast. The radioactive seeds and biopsy marker clips are present, completely intact, and were marked for pathology. IMPRESSION: Specimen radiograph of the left breast. Electronically Signed   By: Dorise Bullion III M.D   On: 07/23/2016 07:47    IMPRESSION/PLAN: 1. 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 Dr. Lisbeth Renshaw reviews her final pathology results and discusses the margin status. We've also reviewed this with Dr. Barry Dienes and there is no additional recommendation for re-excision at this time. Her oncotype score was low and no plans for chemotherapy as a result. We will  now move forward with further discussion of adjuvant radiotherapy. She is healing well and could consider simulation.  We discussed the risks, benefits, short, and long term effects of radiotherapy, and the patient is interested in proceeding. Dr. Lisbeth Renshaw discusses the delivery and logistics of radiotherapy, and would recommend a course of 6 1/2 weeks of treatment, and we again reviewed the role of deep inspiration breath hold technique to avoid the heart. The patient lives in [place], [place] but is interested in keeping her care here in [place]. We will plan to move forward with simulation today. Written consent is obtained and placed in the chart, a copy was provided to the patient. 2. Tightness in the left axilla. The patient demonstrates good ROM but I have recommended she meet with PT to evaluate her for tightness.   In a visit lasting 25 minutes, greater than 50% of the time was spent face to face discussing the role of radiotherapy after reviewing her pathology, and coordinating the patient's care.   The above documentation reflects my direct findings during this shared patient visit. Please see the separate note by Dr. Lisbeth Renshaw on this date for the remainder of the patient's plan of care.    Carola Rhine, PAC

## 2016-08-01 NOTE — Progress Notes (Signed)
  Radiation Oncology         [id number]) [phone] ________________________________  Name: Sally Smith MRN: [id number]  Date: 07/31/2016  DOB: 1960/11/05  DIAGNOSIS:     ICD-10-CM   1. Malignant neoplasm of upper-inner quadrant of left breast in female, estrogen receptor positive ([place]) C50.212    Z17.0      SIMULATION AND TREATMENT PLANNING NOTE  The patient presented for simulation prior to beginning her course of radiation treatment for her diagnosis of left-sided breast cancer. The patient was placed in a supine position on a breast board. A customized vac-lock bag was constructed and this complex treatment device will be used on a daily basis during her treatment. In this fashion, a CT scan was obtained through the chest area and an isocenter was placed near the chest wall within the breast.  The patient will be planned to receive a course of radiation initially to a dose of 50 Gy. This will consist of a whole breast radiotherapy technique. To accomplish this, 2 customized blocks have been designed which will correspond to medial and lateral whole breast tangent fields. This treatment will be accomplished at 2 Gy per fraction. A forward planning technique will also be evaluated to determine if this approach improves the plan. It is anticipated that the patient will then receive a 16 Gy boost to the seroma cavity which has been contoured. This will be accomplished at 2 Gy per fraction.   This initial treatment will consist of a 3-D conformal technique. The seroma has been contoured as the primary target structure. Additionally, dose volume histograms of both this target as well as the lungs and heart will also be evaluated. Such an approach is necessary to ensure that the target area is adequately covered while the nearby critical  normal structures are adequately spared.  Plan:  The final anticipated total dose therefore will correspond to 66 Gy.  Special treatment procedure was performed  today due to the extra time and effort required by myself to plan and prepare this patient for deep inspiration breath hold technique.  I have determined cardiac sparing to be of benefit to this patient to prevent long term cardiac damage due to radiation of the heart.  Bellows were placed on the patient's abdomen. To facilitate cardiac sparing, the patient was coached by the radiation therapists on breath hold techniques and breathing practice was performed. Practice waveforms were obtained. The patient was then scanned while maintaining breath hold in the treatment position.  This image was then transferred over to the imaging specialist. The imaging specialist then created a fusion of the free breathing and breath hold scans using the chest wall as the stable structure. I personally reviewed the fusion in axial, coronal and sagittal image planes.  Excellent cardiac sparing was obtained.  I felt the patient is an appropriate candidate for breath hold and the patient will be treated as such.  The image fusion was then reviewed with the patient to reinforce the necessity of reproducible breath hold.    _______________________________   Jodelle Gross, MD, PhD

## 2016-08-01 NOTE — Progress Notes (Signed)
  Radiation Oncology         [phone]) [phone] ________________________________  Name: Sally Smith MRN: [id number]  Date: 07/31/2016  DOB: Jan 18, 1961  Optical Surface Tracking Plan:  Since intensity modulated radiotherapy (IMRT) and 3D conformal radiation treatment methods are predicated on accurate and precise positioning for treatment, intrafraction motion monitoring is medically necessary to ensure accurate and safe treatment delivery.  The ability to quantify intrafraction motion without excessive ionizing radiation dose can only be performed with optical surface tracking. Accordingly, surface imaging offers the opportunity to obtain 3D measurements of patient position throughout IMRT and 3D treatments without excessive radiation exposure.  I am ordering optical surface tracking for this patient's upcoming course of radiotherapy. ________________________________  Kyung Rudd, MD 08/01/2016 7:32 AM    Reference:   Ursula Alert, J, et al. Surface imaging-based analysis of intrafraction motion for breast radiotherapy patients.Journal of [place], n. 6, nov. 2014. ISSN [id number].   Available at: <http://www.jacmp.org/index.php/jacmp/article/view/4957>.

## 2016-08-03 DIAGNOSIS — Z510 Encounter for antineoplastic radiation therapy: Secondary | ICD-10-CM | POA: Diagnosis not present

## 2016-08-07 ENCOUNTER — Ambulatory Visit
Admission: RE | Admit: 2016-08-07 | Discharge: 2016-08-07 | Disposition: A | Discharge status: Home / Self Care | Payer: BLUE CROSS/BLUE SHIELD | Source: Ambulatory Visit | Attending: Radiation Oncology | Admitting: Radiation Oncology

## 2016-08-07 DIAGNOSIS — Z510 Encounter for antineoplastic radiation therapy: Secondary | ICD-10-CM | POA: Diagnosis not present

## 2016-08-08 ENCOUNTER — Ambulatory Visit
Admission: RE | Admit: 2016-08-08 | Discharge: 2016-08-08 | Disposition: A | Discharge status: Home / Self Care | Payer: BLUE CROSS/BLUE SHIELD | Source: Ambulatory Visit | Attending: Radiation Oncology | Admitting: Radiation Oncology

## 2016-08-08 DIAGNOSIS — Z510 Encounter for antineoplastic radiation therapy: Secondary | ICD-10-CM | POA: Diagnosis not present

## 2016-08-08 DIAGNOSIS — C50212 Malignant neoplasm of upper-inner quadrant of left female breast: Secondary | ICD-10-CM

## 2016-08-08 DIAGNOSIS — Z170 Estrogen receptor positive status [ER+]: Principal | ICD-10-CM

## 2016-08-08 MED ORDER — RADIAPLEXRX EX GEL
Freq: Once | CUTANEOUS | Status: AC
  Administered 2016-08-08: 18:00:00 via TOPICAL

## 2016-08-08 MED ORDER — ALRA NON-METALLIC DEODORANT (RAD-ONC)
1.0000 "application " | Freq: Once | TOPICAL | Status: AC
  Administered 2016-08-08: 1 via TOPICAL

## 2016-08-08 NOTE — Progress Notes (Signed)
Patient education done, radiation therapy and you book, my business card, alra deodorant, radiaplex gel,discussed side effects,fatigue, skin irritation of breast,, short sharp pains, swelling, tenderness, lukewarm showers/bath, dove soap unscented preferred, pat dry,no rubbing,scrubbing or scratching breast, apply skin products after rad tx daily and bedtime, no underwire bras, electric shaver only, get plenty sleep, rest and exercise also, no heat pads or ice packs, use sunscreen, increase protein and extra fluids,water in diet,stay hydrated, sees MD weekly and prn

## 2016-08-09 ENCOUNTER — Ambulatory Visit
Admission: RE | Admit: 2016-08-09 | Discharge: 2016-08-09 | Disposition: A | Discharge status: Home / Self Care | Payer: BLUE CROSS/BLUE SHIELD | Source: Ambulatory Visit | Attending: Radiation Oncology | Admitting: Radiation Oncology

## 2016-08-09 DIAGNOSIS — Z510 Encounter for antineoplastic radiation therapy: Secondary | ICD-10-CM | POA: Diagnosis not present

## 2016-08-10 ENCOUNTER — Ambulatory Visit
Admission: RE | Admit: 2016-08-10 | Discharge: 2016-08-10 | Disposition: A | Discharge status: Home / Self Care | Payer: BLUE CROSS/BLUE SHIELD | Source: Ambulatory Visit | Attending: Radiation Oncology | Admitting: Radiation Oncology

## 2016-08-10 DIAGNOSIS — Z510 Encounter for antineoplastic radiation therapy: Secondary | ICD-10-CM | POA: Diagnosis not present

## 2016-08-13 ENCOUNTER — Ambulatory Visit
Admission: RE | Admit: 2016-08-13 | Discharge: 2016-08-13 | Disposition: A | Discharge status: Home / Self Care | Payer: BLUE CROSS/BLUE SHIELD | Source: Ambulatory Visit | Attending: Radiation Oncology | Admitting: Radiation Oncology

## 2016-08-13 DIAGNOSIS — Z510 Encounter for antineoplastic radiation therapy: Secondary | ICD-10-CM | POA: Diagnosis not present

## 2016-08-14 ENCOUNTER — Ambulatory Visit
Admission: RE | Admit: 2016-08-14 | Discharge: 2016-08-14 | Disposition: A | Discharge status: Home / Self Care | Payer: BLUE CROSS/BLUE SHIELD | Source: Ambulatory Visit | Attending: Radiation Oncology | Admitting: Radiation Oncology

## 2016-08-14 DIAGNOSIS — Z510 Encounter for antineoplastic radiation therapy: Secondary | ICD-10-CM | POA: Diagnosis not present

## 2016-08-15 ENCOUNTER — Ambulatory Visit
Admission: RE | Admit: 2016-08-15 | Discharge: 2016-08-15 | Disposition: A | Discharge status: Home / Self Care | Payer: BLUE CROSS/BLUE SHIELD | Source: Ambulatory Visit | Attending: Radiation Oncology | Admitting: Radiation Oncology

## 2016-08-15 DIAGNOSIS — Z510 Encounter for antineoplastic radiation therapy: Secondary | ICD-10-CM | POA: Diagnosis not present

## 2016-08-16 ENCOUNTER — Ambulatory Visit
Admission: RE | Admit: 2016-08-16 | Discharge: 2016-08-16 | Disposition: A | Discharge status: Home / Self Care | Payer: BLUE CROSS/BLUE SHIELD | Source: Ambulatory Visit | Attending: Radiation Oncology | Admitting: Radiation Oncology

## 2016-08-16 DIAGNOSIS — Z510 Encounter for antineoplastic radiation therapy: Secondary | ICD-10-CM | POA: Diagnosis not present

## 2016-08-17 ENCOUNTER — Ambulatory Visit
Admission: RE | Admit: 2016-08-17 | Discharge: 2016-08-17 | Disposition: A | Discharge status: Home / Self Care | Payer: BLUE CROSS/BLUE SHIELD | Source: Ambulatory Visit | Attending: Radiation Oncology | Admitting: Radiation Oncology

## 2016-08-17 DIAGNOSIS — Z510 Encounter for antineoplastic radiation therapy: Secondary | ICD-10-CM | POA: Diagnosis not present

## 2016-08-17 DIAGNOSIS — Z170 Estrogen receptor positive status [ER+]: Principal | ICD-10-CM

## 2016-08-17 DIAGNOSIS — C50212 Malignant neoplasm of upper-inner quadrant of left female breast: Secondary | ICD-10-CM

## 2016-08-17 MED ORDER — RADIAPLEXRX EX GEL
Freq: Once | CUTANEOUS | Status: AC
  Administered 2016-08-17: 16:00:00 via TOPICAL

## 2016-08-20 ENCOUNTER — Ambulatory Visit
Admission: RE | Admit: 2016-08-20 | Discharge: 2016-08-20 | Disposition: A | Discharge status: Home / Self Care | Payer: BLUE CROSS/BLUE SHIELD | Source: Ambulatory Visit | Attending: Radiation Oncology | Admitting: Radiation Oncology

## 2016-08-20 DIAGNOSIS — Z510 Encounter for antineoplastic radiation therapy: Secondary | ICD-10-CM | POA: Diagnosis not present

## 2016-08-21 ENCOUNTER — Ambulatory Visit
Admission: RE | Admit: 2016-08-21 | Discharge: 2016-08-21 | Disposition: A | Discharge status: Home / Self Care | Payer: BLUE CROSS/BLUE SHIELD | Source: Ambulatory Visit | Attending: Radiation Oncology | Admitting: Radiation Oncology

## 2016-08-21 DIAGNOSIS — Z510 Encounter for antineoplastic radiation therapy: Secondary | ICD-10-CM | POA: Diagnosis not present

## 2016-08-23 ENCOUNTER — Ambulatory Visit
Admission: RE | Admit: 2016-08-23 | Discharge: 2016-08-23 | Disposition: A | Discharge status: Home / Self Care | Payer: BLUE CROSS/BLUE SHIELD | Source: Ambulatory Visit | Attending: Radiation Oncology | Admitting: Radiation Oncology

## 2016-08-23 DIAGNOSIS — Z510 Encounter for antineoplastic radiation therapy: Secondary | ICD-10-CM | POA: Diagnosis not present

## 2016-08-24 ENCOUNTER — Ambulatory Visit
Admission: RE | Admit: 2016-08-24 | Discharge: 2016-08-24 | Disposition: A | Discharge status: Home / Self Care | Payer: BLUE CROSS/BLUE SHIELD | Source: Ambulatory Visit | Attending: Radiation Oncology | Admitting: Radiation Oncology

## 2016-08-24 DIAGNOSIS — Z510 Encounter for antineoplastic radiation therapy: Secondary | ICD-10-CM | POA: Diagnosis not present

## 2016-08-27 ENCOUNTER — Ambulatory Visit
Admission: RE | Admit: 2016-08-27 | Discharge: 2016-08-27 | Disposition: A | Discharge status: Home / Self Care | Payer: BLUE CROSS/BLUE SHIELD | Source: Ambulatory Visit | Attending: Radiation Oncology | Admitting: Radiation Oncology

## 2016-08-27 DIAGNOSIS — Z510 Encounter for antineoplastic radiation therapy: Secondary | ICD-10-CM | POA: Diagnosis not present

## 2016-08-28 ENCOUNTER — Ambulatory Visit
Admission: RE | Admit: 2016-08-28 | Discharge: 2016-08-28 | Disposition: A | Discharge status: Home / Self Care | Payer: BLUE CROSS/BLUE SHIELD | Source: Ambulatory Visit | Attending: Radiation Oncology | Admitting: Radiation Oncology

## 2016-08-28 DIAGNOSIS — C50212 Malignant neoplasm of upper-inner quadrant of left female breast: Secondary | ICD-10-CM | POA: Insufficient documentation

## 2016-08-28 DIAGNOSIS — Z510 Encounter for antineoplastic radiation therapy: Secondary | ICD-10-CM | POA: Insufficient documentation

## 2016-08-28 DIAGNOSIS — Z170 Estrogen receptor positive status [ER+]: Secondary | ICD-10-CM | POA: Diagnosis not present

## 2016-08-29 ENCOUNTER — Ambulatory Visit
Admission: RE | Admit: 2016-08-29 | Discharge: 2016-08-29 | Disposition: A | Discharge status: Home / Self Care | Payer: BLUE CROSS/BLUE SHIELD | Source: Ambulatory Visit | Attending: Radiation Oncology | Admitting: Radiation Oncology

## 2016-08-29 DIAGNOSIS — Z510 Encounter for antineoplastic radiation therapy: Secondary | ICD-10-CM | POA: Diagnosis not present

## 2016-08-30 ENCOUNTER — Ambulatory Visit
Admission: RE | Admit: 2016-08-30 | Discharge: 2016-08-30 | Disposition: A | Discharge status: Home / Self Care | Payer: BLUE CROSS/BLUE SHIELD | Source: Ambulatory Visit | Attending: Radiation Oncology | Admitting: Radiation Oncology

## 2016-08-30 DIAGNOSIS — Z510 Encounter for antineoplastic radiation therapy: Secondary | ICD-10-CM | POA: Diagnosis not present

## 2016-08-31 ENCOUNTER — Ambulatory Visit
Admission: RE | Admit: 2016-08-31 | Discharge: 2016-08-31 | Disposition: A | Discharge status: Home / Self Care | Payer: BLUE CROSS/BLUE SHIELD | Source: Ambulatory Visit | Attending: Radiation Oncology | Admitting: Radiation Oncology

## 2016-08-31 DIAGNOSIS — Z510 Encounter for antineoplastic radiation therapy: Secondary | ICD-10-CM | POA: Diagnosis not present

## 2016-09-03 ENCOUNTER — Ambulatory Visit
Admission: RE | Admit: 2016-09-03 | Discharge: 2016-09-03 | Disposition: A | Discharge status: Home / Self Care | Payer: BLUE CROSS/BLUE SHIELD | Source: Ambulatory Visit | Attending: Radiation Oncology | Admitting: Radiation Oncology

## 2016-09-03 DIAGNOSIS — Z510 Encounter for antineoplastic radiation therapy: Secondary | ICD-10-CM | POA: Diagnosis not present

## 2016-09-03 DIAGNOSIS — C50212 Malignant neoplasm of upper-inner quadrant of left female breast: Secondary | ICD-10-CM

## 2016-09-03 DIAGNOSIS — Z170 Estrogen receptor positive status [ER+]: Principal | ICD-10-CM

## 2016-09-03 MED ORDER — RADIAPLEXRX EX GEL
Freq: Once | CUTANEOUS | Status: AC
  Administered 2016-09-03: 17:00:00 via TOPICAL

## 2016-09-04 ENCOUNTER — Ambulatory Visit
Admission: RE | Admit: 2016-09-04 | Discharge: 2016-09-04 | Disposition: A | Discharge status: Home / Self Care | Payer: BLUE CROSS/BLUE SHIELD | Source: Ambulatory Visit | Attending: Radiation Oncology | Admitting: Radiation Oncology

## 2016-09-04 ENCOUNTER — Other Ambulatory Visit: Payer: Self-pay | Admitting: Radiation Oncology

## 2016-09-04 DIAGNOSIS — Z510 Encounter for antineoplastic radiation therapy: Secondary | ICD-10-CM | POA: Diagnosis not present

## 2016-09-04 DIAGNOSIS — Z170 Estrogen receptor positive status [ER+]: Principal | ICD-10-CM

## 2016-09-04 DIAGNOSIS — C50212 Malignant neoplasm of upper-inner quadrant of left female breast: Secondary | ICD-10-CM

## 2016-09-04 MED ORDER — LORAZEPAM 0.5 MG PO TABS: ORAL_TABLET | ORAL | 0 refills | Status: DC

## 2016-09-05 ENCOUNTER — Encounter: Payer: Self-pay | Admitting: Radiation Oncology

## 2016-09-05 ENCOUNTER — Ambulatory Visit
Admission: RE | Admit: 2016-09-05 | Discharge: 2016-09-05 | Disposition: A | Discharge status: Home / Self Care | Payer: BLUE CROSS/BLUE SHIELD | Source: Ambulatory Visit | Attending: Radiation Oncology | Admitting: Radiation Oncology

## 2016-09-05 DIAGNOSIS — Z510 Encounter for antineoplastic radiation therapy: Secondary | ICD-10-CM | POA: Diagnosis not present

## 2016-09-05 NOTE — Progress Notes (Signed)
I spent time with visible than yesterday prior to her radiation treatment. She indicated to me that she's been trouble with anxiety regarding her diagnosis. She finds that she is tearful regularly, and has had episodes of crying when driving to and from our [place]. She states that although is not pleasant to lay on her treatment table for her therapy, she is not anxious about the treatment itself so much as trying to cope through her recent illness. She has not talked to anybody professionally regarding her feelings, but has had prescriptions previously for anxiety related to claustrophobia, and feels as though this is somewhat frustrating to her when she does lay flat on the treatment table. She would prefer to have another prescription for this that she can take an use as needed if she feels anxious, or if she is having a hard time falling asleep because of her anxiety. We discussed that her symptoms are a common complaint, and are just is important of a side effect of her disease to treat. I've discussed with her the importance of finding coping strategies through these feelings, and have referred her for social work in counseling evaluation. In the meantime a prescription was provided to the patient for Ativan 0.5 mg #60 no refills to be taken 1 tablet by mouth 30 minutes prior to radiation as needed for anxiety or claustrophobia, and daily at bedtime when necessary insomnia. We will continue to follow the sixth effectively, and appreciate the counseling services at cancer center to help her identify coping strategies.    Carola Rhine, PAC

## 2016-09-06 ENCOUNTER — Ambulatory Visit
Admission: RE | Admit: 2016-09-06 | Discharge: 2016-09-06 | Disposition: A | Discharge status: Home / Self Care | Payer: BLUE CROSS/BLUE SHIELD | Source: Ambulatory Visit | Attending: Radiation Oncology | Admitting: Radiation Oncology

## 2016-09-06 DIAGNOSIS — Z510 Encounter for antineoplastic radiation therapy: Secondary | ICD-10-CM | POA: Diagnosis not present

## 2016-09-07 ENCOUNTER — Encounter: Payer: Self-pay | Admitting: *Deleted

## 2016-09-07 ENCOUNTER — Ambulatory Visit: Payer: BLUE CROSS/BLUE SHIELD | Admitting: Radiation Oncology

## 2016-09-07 ENCOUNTER — Ambulatory Visit
Admission: RE | Admit: 2016-09-07 | Discharge: 2016-09-07 | Disposition: A | Discharge status: Home / Self Care | Payer: BLUE CROSS/BLUE SHIELD | Source: Ambulatory Visit | Attending: Radiation Oncology | Admitting: Radiation Oncology

## 2016-09-07 DIAGNOSIS — Z510 Encounter for antineoplastic radiation therapy: Secondary | ICD-10-CM | POA: Diagnosis not present

## 2016-09-07 NOTE — Progress Notes (Signed)
[place] Work  Clinical Social Work was referred by Pension scheme manager PA for assessment of psychosocial needs due to concerns related to anxiety, adjustment to illness.  Clinical Social Worker met with patient at [facility] prior to treatment. CSW introduced self, explained role of CSW/Pt and Family Support Team, support groups and other resources to assist. CSW and pt discussed in detail common emotions experienced during treatment and possible coping techniques. Pt has several things that she finds "fills her up". She loves her animals, friends and has great support through family. She seems able to verbalize her emotions and concerns. CSW reviewed various additional options for support and help with coping. Chong Sicilian is very excited about the option of free massages through the [facility] and CSW provided her with vouchers today. She is also open to groups and classes. She plans to consider possible counseling as well and plans to reach out when she is ready. CSW team to follow and assist.      Clinical Social Work interventions:  Supportive listening Resource education and referral  Loren Racer, LCSW, OSW-C Clinical Social Worker [place]  [place] Phone: [phone] Fax: [phone]

## 2016-09-10 ENCOUNTER — Ambulatory Visit
Admission: RE | Admit: 2016-09-10 | Discharge: 2016-09-10 | Disposition: A | Discharge status: Home / Self Care | Payer: BLUE CROSS/BLUE SHIELD | Source: Ambulatory Visit | Attending: Radiation Oncology | Admitting: Radiation Oncology

## 2016-09-10 DIAGNOSIS — Z510 Encounter for antineoplastic radiation therapy: Secondary | ICD-10-CM | POA: Diagnosis not present

## 2016-09-11 ENCOUNTER — Ambulatory Visit
Admission: RE | Admit: 2016-09-11 | Discharge: 2016-09-11 | Disposition: A | Discharge status: Home / Self Care | Payer: BLUE CROSS/BLUE SHIELD | Source: Ambulatory Visit | Attending: Radiation Oncology | Admitting: Radiation Oncology

## 2016-09-11 DIAGNOSIS — Z510 Encounter for antineoplastic radiation therapy: Secondary | ICD-10-CM | POA: Diagnosis not present

## 2016-09-12 ENCOUNTER — Ambulatory Visit
Admission: RE | Admit: 2016-09-12 | Discharge: 2016-09-12 | Disposition: A | Discharge status: Home / Self Care | Payer: BLUE CROSS/BLUE SHIELD | Source: Ambulatory Visit | Attending: Radiation Oncology | Admitting: Radiation Oncology

## 2016-09-12 ENCOUNTER — Ambulatory Visit: Payer: BLUE CROSS/BLUE SHIELD | Admitting: Radiation Oncology

## 2016-09-12 DIAGNOSIS — Z510 Encounter for antineoplastic radiation therapy: Secondary | ICD-10-CM | POA: Diagnosis not present

## 2016-09-13 ENCOUNTER — Ambulatory Visit
Admission: RE | Admit: 2016-09-13 | Discharge: 2016-09-13 | Disposition: A | Discharge status: Home / Self Care | Payer: BLUE CROSS/BLUE SHIELD | Source: Ambulatory Visit | Attending: Radiation Oncology | Admitting: Radiation Oncology

## 2016-09-13 ENCOUNTER — Ambulatory Visit: Payer: BLUE CROSS/BLUE SHIELD

## 2016-09-13 DIAGNOSIS — Z510 Encounter for antineoplastic radiation therapy: Secondary | ICD-10-CM | POA: Diagnosis not present

## 2016-09-14 ENCOUNTER — Ambulatory Visit
Admission: RE | Admit: 2016-09-14 | Discharge: 2016-09-14 | Disposition: A | Discharge status: Home / Self Care | Payer: BLUE CROSS/BLUE SHIELD | Source: Ambulatory Visit | Attending: Radiation Oncology | Admitting: Radiation Oncology

## 2016-09-14 ENCOUNTER — Ambulatory Visit: Payer: BLUE CROSS/BLUE SHIELD

## 2016-09-14 DIAGNOSIS — Z510 Encounter for antineoplastic radiation therapy: Secondary | ICD-10-CM | POA: Diagnosis not present

## 2016-09-17 ENCOUNTER — Ambulatory Visit: Payer: BLUE CROSS/BLUE SHIELD

## 2016-09-17 ENCOUNTER — Ambulatory Visit
Admission: RE | Admit: 2016-09-17 | Discharge: 2016-09-17 | Disposition: A | Discharge status: Home / Self Care | Payer: BLUE CROSS/BLUE SHIELD | Source: Ambulatory Visit | Attending: Radiation Oncology | Admitting: Radiation Oncology

## 2016-09-17 DIAGNOSIS — Z510 Encounter for antineoplastic radiation therapy: Secondary | ICD-10-CM | POA: Diagnosis not present

## 2016-09-18 ENCOUNTER — Ambulatory Visit: Payer: BLUE CROSS/BLUE SHIELD

## 2016-09-18 ENCOUNTER — Ambulatory Visit
Admission: RE | Admit: 2016-09-18 | Discharge: 2016-09-18 | Disposition: A | Discharge status: Home / Self Care | Payer: BLUE CROSS/BLUE SHIELD | Source: Ambulatory Visit | Attending: Radiation Oncology | Admitting: Radiation Oncology

## 2016-09-18 DIAGNOSIS — Z510 Encounter for antineoplastic radiation therapy: Secondary | ICD-10-CM | POA: Diagnosis not present

## 2016-09-19 ENCOUNTER — Ambulatory Visit
Admission: RE | Admit: 2016-09-19 | Discharge: 2016-09-19 | Disposition: A | Discharge status: Home / Self Care | Payer: BLUE CROSS/BLUE SHIELD | Source: Ambulatory Visit | Attending: Radiation Oncology | Admitting: Radiation Oncology

## 2016-09-19 DIAGNOSIS — Z510 Encounter for antineoplastic radiation therapy: Secondary | ICD-10-CM | POA: Diagnosis not present

## 2016-09-20 ENCOUNTER — Ambulatory Visit
Admission: RE | Admit: 2016-09-20 | Discharge: 2016-09-20 | Disposition: A | Discharge status: Home / Self Care | Payer: BLUE CROSS/BLUE SHIELD | Source: Ambulatory Visit | Attending: Radiation Oncology | Admitting: Radiation Oncology

## 2016-09-20 DIAGNOSIS — Z510 Encounter for antineoplastic radiation therapy: Secondary | ICD-10-CM | POA: Diagnosis not present

## 2016-09-21 ENCOUNTER — Telehealth: Payer: Self-pay | Admitting: *Deleted

## 2016-09-21 ENCOUNTER — Ambulatory Visit
Admission: RE | Admit: 2016-09-21 | Discharge: 2016-09-21 | Disposition: A | Discharge status: Home / Self Care | Payer: BLUE CROSS/BLUE SHIELD | Source: Ambulatory Visit | Attending: Radiation Oncology | Admitting: Radiation Oncology

## 2016-09-21 DIAGNOSIS — Z170 Estrogen receptor positive status [ER+]: Principal | ICD-10-CM

## 2016-09-21 DIAGNOSIS — Z510 Encounter for antineoplastic radiation therapy: Secondary | ICD-10-CM | POA: Diagnosis not present

## 2016-09-21 DIAGNOSIS — C50212 Malignant neoplasm of upper-inner quadrant of left female breast: Secondary | ICD-10-CM

## 2016-09-21 MED ORDER — RADIAPLEXRX EX GEL
Freq: Once | CUTANEOUS | Status: AC
  Administered 2016-09-21: 16:00:00 via TOPICAL

## 2016-09-21 NOTE — Telephone Encounter (Signed)
  Oncology Nurse Navigator Documentation  Navigator Location: [facility] (09/21/16 1600)   )Navigator Encounter Type: Telephone (09/21/16 1600) Telephone: [facility] Call;Appt Confirmation/Clarification (09/21/16 1600)                   Patient Visit Type: [facility] (09/21/16 1600) Treatment Phase: Final Radiation Tx (09/21/16 1600)09/24/16 Barriers/Navigation Needs: Coordination of Care (09/21/16 1600)   Interventions: Referrals;Coordination of Care (09/21/16 1600) Referrals: Survivorship (09/21/16 1600)          Acuity: Level 1 (09/21/16 1600)         Time Spent with Patient: 15 (09/21/16 1600)

## 2016-09-24 ENCOUNTER — Ambulatory Visit: Payer: BLUE CROSS/BLUE SHIELD | Admitting: Hematology

## 2016-09-24 ENCOUNTER — Ambulatory Visit
Admission: RE | Admit: 2016-09-24 | Discharge: 2016-09-24 | Disposition: A | Discharge status: Home / Self Care | Payer: BLUE CROSS/BLUE SHIELD | Source: Ambulatory Visit | Attending: Radiation Oncology | Admitting: Radiation Oncology

## 2016-09-24 DIAGNOSIS — Z510 Encounter for antineoplastic radiation therapy: Secondary | ICD-10-CM | POA: Diagnosis not present

## 2016-09-25 ENCOUNTER — Ambulatory Visit: Payer: BLUE CROSS/BLUE SHIELD

## 2016-09-26 ENCOUNTER — Encounter: Payer: Self-pay | Admitting: Radiation Oncology

## 2016-09-26 NOTE — Progress Notes (Signed)
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     Plan: The patient has completed radiation treatment. The patient will return to radiation oncology clinic for routine followup in one month. I advised them to call or return sooner if they have any questions or concerns related to their recovery or treatment.  ------------------------------------------------  Jodelle Gross, MD, PhD  This document serves as a record of services personally performed by Kyung Rudd, MD. It was created on his behalf by Linward Natal, a trained medical scribe. The creation of this record is based on the scribe's personal observations and the provider's statements to them. This document has been checked and approved by the attending provider.

## 2016-09-26 NOTE — Progress Notes (Signed)
[facility]  [phone] [phone]  Clinic Follow Up Note   Patient Care Team: Sally Koch, MD as PCP - General (Obstetrics and Gynecology) Sally Merle, MD as Consulting Physician (Hematology) Sally Klein, MD as Consulting Physician (General Surgery) Sally Rudd, MD as Consulting Physician (Radiation Oncology) 09/27/2016  CHIEF COMPLAINTS/PURPOSE OF CONSULTATION:  Left breast cancer  Oncology History   Cancer Staging Malignant neoplasm of upper-inner quadrant of left breast in female, estrogen receptor positive ([place]) Staging form: Breast, AJCC 8th Edition - Clinical stage from 05/22/2016: Stage IA (cT1a, cN0, cM0, G2, ER: Positive, Sally: Positive, HER2: Negative) - Signed by Sally Merle, MD on 05/29/2016       Malignant neoplasm of upper-inner quadrant of left breast in female, estrogen receptor positive ([place])   05/10/2016 Mammogram    Bilateral screening mammogram on 05/10/16 showed a possible distortion with calcifications in the left breast.      05/16/2016 Mammogram    Diagnostic mammogram and US showed Persistent distortion in the upper inner quadrant of the left breast possibly correlating with the sonographic area of acoustic shadowing, measuring about 1m. [place]of left axilla (-)      05/22/2016 Receptors her2    ER 100%, Sally 70%+, Ki67 5%      05/22/2016 Initial Biopsy    Diagnosis Breast, left, needle core biopsy, upper inner - INVASIVE AND IN SITU LOBULAR CARCINOMA, G1-2      05/22/2016 Initial Diagnosis    Malignant neoplasm of upper-inner quadrant of left breast in female, estrogen receptor positive ([place]      05/31/2016 Genetic Testing    Genetic counseling and testing for hereditary cancer syndromes performed on 05/31/2016. Results are negative for pathogenic mutations in 46 genes analyzed by [facility]'s Common Hereditary Cancers Panel. Results are dated 06/11/2016. Genes analyzed include: APC, ATM, AXIN2, BARD1, BMPR1A, BRCA1, BRCA2,  BRIP1, CDH1, CDKN2A, CHEK2, CTNNA1, DICER1, EPCAM, GREM1, HOXB13, KIT, MEN1, MLH1, MSH2, MSH3, MSH6, MUTYH, NBN, NF1, NTHL1, PALB2, PDGFRA, PMS2, POLD1, POLE, PTEN, RAD50, RAD51C, RAD51D, SDHA, SDHB, SDHC, SDHD, SMAD4, SMARCA4, STK11, TP53, TSC1, TSC2, and VHL.       07/01/2016 Oncotype testing    Oncotype testing showed reoccurance score of 11. With 10 year risk of distant reoccurrence 7% with tamoxifen alone      07/04/2016 Surgery    Left breast lumpectomy with radioactive seed x2 and sentinel lymph node biopsy by Dr. BBarry [facility]     08/08/2016 - 09/24/2016 Radiation Therapy    Patient underwent radiation with SallyMoody       HISTORY OF PRESENTING ILLNESS (05/30/2016):  [name]89y.o. female is here because of a new diagnosis of left breast cancer.  Bilateral screening mammogram on 05/10/16 showed a possible distortion with calcifications in the left breast. Diagnostic left breast mammogram on 05/16/16 showed no suspicious microcalcifications in the left breast, but did confirm the presence of distortion in the upper inner quadrant of the left breast. Other areas of questioned distortion was not persistent on further evaluation. [place] the left breast showed a vague area of acoustic shadowing in the 11:30 position 3 cm fron the nipple measuring about  0.6 cm and associated with adjacent vascularity on Doppler evaluation. The left axilla was negative for adenopathy.  Biopsy of the UIQ of the left breast on 05/22/16 revealed grade 1-2 revealed invasive lobular carcinoma and LCIS. ER 100%+, Sally 70%+, Ki67 5%  The patient presents today with her husband in multidisciplinary breast clinic to  discuss treatment options for the management of her disease. The patient's breast density category is C: The breast tissue is heterogeneously dense, which may obscure small masses.  GYN HISTORY  Menarchal: 9 LMP: 48 Contraceptive: Post-menopause HRT: No GP: G3P2, one child miscarriage  Denies pain,  discomfort, arthritis, changes in appetite or weight, HTN, diabetes, or hyperlipidemia. The patient reports cervical cancer at age 71. She [facility]. She's has 2 children since then. She had skin cancer of the right lower extremity about 10 years ago by Dr. [facility]. She also had removal of basal cell carcinoma of the right face a few years ago. She has glaucoma. She reports having bad hot flashes when she entered menopause. She has hot flashes 1-2 times a month now.   INTERIM HISTORY Patient presents to the clinic today. Patient has been having intermittent pain, especially when she is physically active. She completed radiation on 8/6 and has some erythema. She thinks she did well physically although she had some fatigue.Emotionally, she experienced some anxiety and depression. She had anxiety previous to her cancer diagnosis.  She begun taking oxycodone in May and takes it about twice weekly for her left breast pain.   MEDICAL HISTORY:  Past Medical History:  Diagnosis Date  . Anxiety   . Cancer of left breast ([place])    [facility] genetic testing negative on 05/31/16  . Genetic testing 06/18/2016   [name] underwent genetic counseling and testing for hereditary cancer syndromes on 05/31/2016. Her results were negative for mutations in all 46 genes analyzed by [facility]'s 46-gene Common Hereditary Cancers Panel. Genes analyzed include: APC, ATM, AXIN2, BARD1, BMPR1A, BRCA1, BRCA2, BRIP1, CDH1, CDKN2A, CHEK2, CTNNA1, DICER1, EPCAM, GREM1, HOXB13, KIT, MEN1, MLH1, MSH2, MSH3, MSH6, MUTYH, NBN,  . Skin cancer    left breast     SURGICAL HISTORY: Past Surgical History:  Procedure Laterality Date  . BREAST LUMPECTOMY WITH RADIOACTIVE SEED AND SENTINEL LYMPH NODE BIOPSY Left 07/04/2016   Procedure: LEFT BREAST LUMPECTOMY WITH RADIOACTIVE SEED X 2 AND SENTINEL LYMPH NODE BIOPSY;  Surgeon: Sally Klein, MD;  Location: [facility];  Service: General;  Laterality:  Left;  . surgery on right leg    . TONSILLECTOMY      SOCIAL HISTORY: Social History   Social History  . Marital status: Married    Spouse name: N/A  . Number of children: N/A  . Years of education: N/A   Occupational History  . Not on file.   Social History Main Topics  . Smoking status: Never Smoker  . Smokeless tobacco: Never Used  . Alcohol use 0.6 oz/week    1 Glasses of wine per week     Comment: Occassional  . Drug use: No  . Sexual activity: Yes    Birth control/ protection: Surgical     Comment: husband had vasectomy   Other Topics Concern  . Not on file   Social History Narrative  . No narrative on file    FAMILY HISTORY: Family History  Problem Relation Age of Onset  . Breast cancer Mother 105       Contralateral breast cancer a few years later that metastasized.    ALLERGIES:  is allergic to bee venom; latex; and sulfur.  MEDICATIONS:  Current Outpatient Prescriptions  Medication Sig Dispense Refill  . Calcium Carb-Cholecalciferol (CALCIUM-VITAMIN D) 500-200 MG-UNIT tablet Take by mouth.    Marland Kitchen LORazepam (ATIVAN) 0.5 MG tablet 1 tablet po 30 minutes prior to  radiation and or qHS prn anxiety 60 tablet 0  . oxyCODONE (OXY IR/ROXICODONE) 5 MG immediate release tablet Take 1-2 tablets (5-10 mg total) by mouth every 4 (four) hours as needed for moderate pain, severe pain or breakthrough pain. 40 tablet 0  . Travoprost, BAK Free, (TRAVATAN) 0.004 % SOLN ophthalmic solution 1 drop at bedtime.    . Wound Dressings (RADIAPLEXRX EX) Apply 1 application topically 2 (two) times daily.    . Zolpidem Tartrate (AMBIEN PO) Take by mouth as needed.    Marland Kitchen EPINEPHrine 0.3 mg/0.3 mL IJ SOAJ injection Inject 0.3 mg into the muscle.    . letrozole (FEMARA) 2.5 MG tablet Take 1 tablet (2.5 mg total) by mouth daily. 30 tablet 2  . traMADol (ULTRAM) 50 MG tablet Take 1 tablet (50 mg total) by mouth every 6 (six) hours as needed. 30 tablet 0  . tretinoin (RETIN-A) 0.025 % cream  Apply small amount to face nightly     No current facility-administered medications for this visit.     REVIEW OF SYSTEMS:   Constitutional: Denies fevers, chills or abnormal night sweats Eyes: Denies blurriness of vision, double vision or watery eyes Ears, nose, mouth, throat, and face: Denies mucositis or sore throat Respiratory: Denies cough, dyspnea or wheezes Cardiovascular: Denies palpitation, chest discomfort or lower extremity swelling Gastrointestinal:  Denies nausea, heartburn or change in bowel habits Skin: Denies abnormal skin rashes (+)erythema of radiated area Lymphatics: Denies new lymphadenopathy or easy bruising Neurological:Denies numbness, tingling or new weaknesses Behavioral/Psych:(+)anxiety All other systems were reviewed with the patient and are negative.  PHYSICAL EXAMINATION:  ECOG PERFORMANCE STATUS: 0 - Asymptomatic  Vitals:   09/27/16 1047  BP: 126/78  Pulse: 78  Resp: 16  Temp: 97.8 F (36.6 C)  SpO2: 100%   Filed Weights   09/27/16 1047  Weight: 134 lb 4.8 oz (60.9 kg)    GENERAL:alert, no distress and comfortable SKIN: skin color, texture, turgor are normal, no rashes or significant lesions EYES: normal, conjunctiva are pink and non-injected, sclera clear OROPHARYNX:no exudate, no erythema and lips, buccal mucosa, and tongue normal  NECK: supple, thyroid normal size, non-tender, without nodularity LYMPH:  no palpable lymphadenopathy in the cervical, axillary or inguinal LUNGS: clear to auscultation and percussion with normal breathing effort HEART: regular rate & rhythm and no murmurs and no lower extremity edema ABDOMEN:abdomen soft, non-tender and normal bowel sounds Musculoskeletal:no cyanosis of digits and no clubbing  PSYCH: alert & oriented x 3 with fluent speech NEURO: no focal motor/sensory deficits Breast: Breast inspection showed them to be symmetrical with no nipple discharge.  Mild diffused skin around surgical incision around  areola healed well, no scarred tissue. No masses I could appreciated   LABORATORY DATA:  I have reviewed the data as listed CBC Latest Ref Rng & Units 05/30/2016  WBC 3.9 - 10.3 10e3/uL 5.1  Hemoglobin 11.6 - 15.9 g/dL 14.2  Hematocrit 34.8 - 46.6 % 41.1  Platelets 145 - 400 10e3/uL 169   CMP Latest Ref Rng & Units 05/30/2016  Glucose 70 - 140 mg/dl 85  BUN 7.0 - 26.0 mg/dL 11.3  Creatinine 0.6 - 1.1 mg/dL 0.7  Sodium 136 - 145 mEq/L 140  Potassium 3.5 - 5.1 mEq/L 3.5  CO2 22 - 29 mEq/L 27  Calcium 8.4 - 10.4 mg/dL 9.7  Total Protein 6.4 - 8.3 g/dL 7.8  Total Bilirubin 0.20 - 1.20 mg/dL 0.59  Alkaline Phos 40 - 150 U/L 74  AST 5 - 34  U/L 24  ALT 0 - 55 U/L 35    PATHOLOGY   1   Diagnosis 07/04/2016 1. Breast, lumpectomy, Left - INVASIVE AND IN SITU LOBULAR CARCINOMA, 2.8 CM. - INVASIVE CARCINOMA FOCALLY INVOLVES POSTERIOR AND ANTERIOR MARGINS. - LOBULAR CARCINOMA IN SITU FOCALLY LESS THAN 0.1 CM FROM POSTERIOR MARGIN - PREVIOUS BIOPSY SITE. 2. Lymph node, sentinel, biopsy, Left Axillary #1 - ONE BENIGN LYMPH NODE (0/1). 3. Lymph node, sentinel, biopsy, Left Axillary #2 - ONE BENIGN LYMPH NODE (0/1). 4. Lymph node, sentinel, biopsy, Left - ONE BENIGN LYMPH NODE (0/1). 5. Lymph node, sentinel, biopsy, Left Axillary #3 - ONE BENIGN LYMPH NODE (0/1). Microscopic Comment 1. BREAST, INVASIVE TUMOR Procedure: Localized lumpectomy and four sentinel lymph nodes Laterality: Left breast Tumor Size: 2.8 cm Histologic Type: Lobular Grade: I Tubular Differentiation: 3 Nuclear Pleomorphism: 1 Mitotic Count: 1 Ductal Carcinoma in Situ (DCIS): No Extent of Tumor: Skin: N/A Nipple: N/A Skeletal muscle: N/A Margins: Anterior and posterior margins focally involved by invasive carcinoma. Invasive carcinoma, distance from closest margin: Focally 0 cm from anterior and posterior margin 1 of 3 FINAL for [facility], [facility] ([id number]) Microscopic Comment(continued) DCIS,  distance from closest margin: Focally less than 0.1 cm from posterior margin Regional Lymph Nodes: Number of Lymph Nodes Examined: 4 Number of Sentinel Lymph Nodes Examined: 4 Lymph Nodes with Macrometastases: 0 Lymph Nodes with Micrometastases: 0 Lymph Nodes with Isolated Tumor Cells: 0 Breast Prognostic Profile: Case [id number] Estrogen Receptor: 100%, positive, strong staining Progesterone Receptor: 70%, positive, strong staining Her2: Negative, ratio 1.45 Ki-67: 5% Best tumor block for sendout testing: 1-k Pathologic Stage Classification (pTNM, AJCC 8th Edition): Primary Tumor (pT): pT2 Regional Lymph Nodes (pN): pN0 Distant Metastases (pM): pMX Comments: Immunohistochemistry for cytokeratin AE1/AE3 is performed on the sentinel lymph nodes (parts 2-5) and no positivity is identified. (JDP:kh 07/05/16.  Diagnosis Breast, left, needle core biopsy, upper inner - INVASIVE AND IN SITU MAMMARY CARCINOMA. - SEE COMMENT. Microscopic Comment The invasive carcinoma appears grade 1-2. An E-cadherin stain and a breast prognostic profile will be performed and the results reported separately. The results were called to [facility] on 05/23/16. (JBK:gt, 05/23/16) Enid Cutter MD Pathologist, Electronic Signature (Case signed 05/23/2016)  RADIOGRAPHIC STUDIES: I have personally reviewed the radiological images as listed and agreed with the findings in the report. No results found.  ASSESSMENT & PLAN: 56 y.o. Caucasian female with screening detected left breast cancer.  1. 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 -the Oncotype Dx result was reviewed with her in details. She has low risk based on the recurrence score, which predicts 10 year distant recurrence after  5 years of tamoxifen 7%. She does not need adjuvant chemotherapy based on the low recurrence score. -She now has completed adjuvant breast radiation, tolerated well overall. -Giving the strong ER and Sally expression in her postmenopausal status, I recommend adjuvant endocrine therapy with aromatase inhibitor for a total of 5-10 years to reduce the risk of cancer recurrence. Given her T2 lesion, and lobular histology, I recommend 7 years therapy if she tolerates well.  --The potential benefit and side effects, which includes but not limited to, hot flash, skin and vaginal dryness, metabolic changes ( increased blood glucose, cholesterol, weight, etc.), slightly in increased risk of cardiovascular disease, cataracts,  muscular and joint discomfort, osteopenia and osteoporosis, etc, were discussed with her in great details. She is interested, and we'll start in 2-3 weeks  -We also discussed the breast cancer surveillance after her surgery. She will continue annual screening mammogram, self exam, and a routine office visit with lab and exam with [place]. -I previously encouraged her to have healthy diet and exercise regularly to help with her fatigue  2. Left breast pain -secondary to breast surgery and radiation -She has been taking oxycodone a few times a week. 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 Bone health -She has not had a bone density scan for many years. I'll schedule 1 in the next few months. -We discussed potential osteopenia and osteoporosis from letrozole, we'll monitor her bone density scan every 2 years. -She will continue calcium and vitamin D supplements.  4. Genetic Counseling -The patient's mother has a history of breast cancer at an early age and the patient has a personal history of  cervical cancer at the age of 24, status post surgical intervention. -The patient will be referred to genetic counseling  5. Glaucoma -Managed by her PCP. She takes Travatan drops.  6. Anxiety -She use Ativan as needed, infrequently   PLAN - prescribe tramadol, no refill for oxycodone  - prescribe letrozole today. She will start in 2-3 weeks - lab and f/u in 3 months  -DEXA scan at [facility] in 3 months when she returns   Orders Placed This Encounter  Procedures  . CBC with Differential    Standing Status:   Standing    Number of Occurrences:   30    Standing Expiration Date:   09/26/2021  . Comprehensive metabolic panel    Standing Status:   Standing    Number of Occurrences:   30    Standing Expiration Date:   09/26/2021    All questions were answered. The patient knows to call the clinic with any problems, questions or concerns. I spent 25 minutes counseling the patient face to face. The total time spent in the appointment was 30 minutes and more than 50% was on counseling.    This document serves as a record of services personally performed by Sally Merle, MD. It was created on her behalf by Brandt Loosen, a trained medical scribe. The creation of this record is based on the scribe's personal observations and the provider's statements to them. This document has been checked and approved by the attending provider.  [facility]  09/27/2016

## 2016-09-27 ENCOUNTER — Encounter: Payer: Self-pay | Admitting: Hematology

## 2016-09-27 ENCOUNTER — Telehealth: Payer: Self-pay | Admitting: Hematology

## 2016-09-27 ENCOUNTER — Ambulatory Visit (HOSPITAL_BASED_OUTPATIENT_CLINIC_OR_DEPARTMENT_OTHER): Payer: BLUE CROSS/BLUE SHIELD | Admitting: Hematology

## 2016-09-27 VITALS — BP 126/78 | HR 78 | Temp 97.8°F | Resp 16 | Ht 63.0 in | Wt 134.3 lb

## 2016-09-27 DIAGNOSIS — Z170 Estrogen receptor positive status [ER+]: Secondary | ICD-10-CM

## 2016-09-27 DIAGNOSIS — E2839 Other primary ovarian failure: Secondary | ICD-10-CM

## 2016-09-27 DIAGNOSIS — C50212 Malignant neoplasm of upper-inner quadrant of left female breast: Secondary | ICD-10-CM | POA: Diagnosis not present

## 2016-09-27 MED ORDER — LETROZOLE 2.5 MG PO TABS: 2.5000 mg | ORAL_TABLET | Freq: Every day | ORAL | 2 refills | Status: DC

## 2016-09-27 MED ORDER — TRAMADOL HCL 50 MG PO TABS: 50.0000 mg | ORAL_TABLET | Freq: Four times a day (QID) | ORAL | 0 refills | Status: DC | PRN

## 2016-09-27 NOTE — Telephone Encounter (Signed)
Scheduled appt per 8/9 los - Gave patient AVS and calender per los.  

## 2016-10-18 ENCOUNTER — Telehealth: Payer: Self-pay | Admitting: *Deleted

## 2016-10-18 NOTE — Telephone Encounter (Signed)
Pt called stating she is having a hard time emotionally taking AI. Relate mother passed 1 yr ago from breast cancer and was taking AI. Informed pt that if she needed to take a week to process she could. Discussed that pharmaceutical companies are required to state all s/e that were noted. Informed not typical for patients to have all s/e listed. Request pt to call back in one week to discuss AI therapy. Received verbal understanding.

## 2016-10-23 ENCOUNTER — Emergency Department (HOSPITAL_COMMUNITY): Payer: BLUE CROSS/BLUE SHIELD

## 2016-10-23 ENCOUNTER — Emergency Department (HOSPITAL_COMMUNITY)
Admission: EM | Admit: 2016-10-23 | Discharge: 2016-10-23 | Disposition: A | Discharge status: Home / Self Care | Payer: BLUE CROSS/BLUE SHIELD | Attending: Emergency Medicine | Admitting: Emergency Medicine

## 2016-10-23 ENCOUNTER — Encounter (HOSPITAL_COMMUNITY): Payer: Self-pay | Admitting: Emergency Medicine

## 2016-10-23 DIAGNOSIS — C50212 Malignant neoplasm of upper-inner quadrant of left female breast: Secondary | ICD-10-CM | POA: Diagnosis not present

## 2016-10-23 DIAGNOSIS — Z91040 Latex allergy status: Secondary | ICD-10-CM | POA: Diagnosis not present

## 2016-10-23 DIAGNOSIS — Z85828 Personal history of other malignant neoplasm of skin: Secondary | ICD-10-CM | POA: Diagnosis not present

## 2016-10-23 DIAGNOSIS — Z79899 Other long term (current) drug therapy: Secondary | ICD-10-CM | POA: Insufficient documentation

## 2016-10-23 DIAGNOSIS — K5792 Diverticulitis of intestine, part unspecified, without perforation or abscess without bleeding: Secondary | ICD-10-CM | POA: Insufficient documentation

## 2016-10-23 DIAGNOSIS — R1031 Right lower quadrant pain: Secondary | ICD-10-CM | POA: Diagnosis present

## 2016-10-23 LAB — COMPREHENSIVE METABOLIC PANEL
ALBUMIN: 4.7 g/dL (ref 3.5–5.0)
ALK PHOS: 124 U/L (ref 38–126)
ALT: 54 U/L (ref 14–54)
ANION GAP: 13 (ref 5–15)
AST: 33 U/L (ref 15–41)
BUN: 8 mg/dL (ref 6–20)
CO2: 23 mmol/L (ref 22–32)
Calcium: 10.3 mg/dL (ref 8.9–10.3)
Chloride: 100 mmol/L — ABNORMAL LOW (ref 101–111)
Creatinine, Ser: 0.63 mg/dL (ref 0.44–1.00)
GFR calc Af Amer: >60 mL/min (ref >60)
GFR calc non Af Amer: >60 mL/min (ref >60)
GLUCOSE: 112 mg/dL — AB (ref 65–99)
Potassium: 3.7 mmol/L (ref 3.5–5.1)
SODIUM: 136 mmol/L (ref 135–145)
Total Bilirubin: 1.1 mg/dL (ref 0.3–1.2)
Total Protein: 8.9 g/dL — ABNORMAL HIGH (ref 6.5–8.1)

## 2016-10-23 LAB — CBC
HEMATOCRIT: 41.8 % (ref 36.0–46.0)
HEMOGLOBIN: 15.4 g/dL — AB (ref 12.0–15.0)
MCH: 35.6 pg — ABNORMAL HIGH (ref 26.0–34.0)
MCHC: 36.8 g/dL — AB (ref 30.0–36.0)
MCV: 96.5 fL (ref 78.0–100.0)
Platelets: 223 10*3/uL (ref 150–400)
RBC: 4.33 MIL/uL (ref 3.87–5.11)
RDW: 12.1 % (ref 11.5–15.5)
WBC: 9.9 10*3/uL (ref 4.0–10.5)

## 2016-10-23 LAB — URINALYSIS, ROUTINE W REFLEX MICROSCOPIC
Bilirubin Urine: NEGATIVE
GLUCOSE, UA: NEGATIVE mg/dL
HGB URINE DIPSTICK: NEGATIVE
Ketones, ur: 5 mg/dL — AB
Leukocytes, UA: NEGATIVE
Nitrite: NEGATIVE
PH: 6 (ref 5.0–8.0)
PROTEIN: NEGATIVE mg/dL

## 2016-10-23 LAB — LIPASE, BLOOD: LIPASE: 24 U/L (ref 11–51)

## 2016-10-23 MED ORDER — METRONIDAZOLE 500 MG PO TABS
500.0000 mg | ORAL_TABLET | Freq: Once | ORAL | Status: AC
  Administered 2016-10-23: 500 mg via ORAL
  Filled 2016-10-23: qty 1

## 2016-10-23 MED ORDER — CIPROFLOXACIN HCL 500 MG PO TABS: 500.0000 mg | ORAL_TABLET | Freq: Two times a day (BID) | ORAL | 0 refills | Status: DC

## 2016-10-23 MED ORDER — CIPROFLOXACIN HCL 500 MG PO TABS
500.0000 mg | ORAL_TABLET | Freq: Once | ORAL | Status: AC
  Administered 2016-10-23: 500 mg via ORAL
  Filled 2016-10-23: qty 1

## 2016-10-23 MED ORDER — HYDROCODONE-ACETAMINOPHEN 5-325 MG PO TABS: 2.0000 | ORAL_TABLET | ORAL | 0 refills | Status: DC | PRN

## 2016-10-23 MED ORDER — HYDROCODONE-ACETAMINOPHEN 5-325 MG PO TABS
1.0000 | ORAL_TABLET | Freq: Once | ORAL | Status: AC
  Administered 2016-10-23: 1 via ORAL
  Filled 2016-10-23: qty 1

## 2016-10-23 MED ORDER — METRONIDAZOLE IN NACL 5-0.79 MG/ML-% IV SOLN
500.0000 mg | Freq: Once | INTRAVENOUS | Status: DC
  Filled 2016-10-23: qty 100

## 2016-10-23 MED ORDER — SODIUM CHLORIDE 0.9 % IV BOLUS (SEPSIS)
1000.0000 mL | Freq: Once | INTRAVENOUS | Status: AC
  Administered 2016-10-23: 1000 mL via INTRAVENOUS

## 2016-10-23 MED ORDER — METRONIDAZOLE 500 MG PO TABS: 500.0000 mg | ORAL_TABLET | Freq: Three times a day (TID) | ORAL | 0 refills | Status: DC

## 2016-10-23 MED ORDER — ONDANSETRON HCL 4 MG/2ML IJ SOLN
4.0000 mg | Freq: Once | INTRAMUSCULAR | Status: AC
  Administered 2016-10-23: 4 mg via INTRAVENOUS
  Filled 2016-10-23: qty 2

## 2016-10-23 MED ORDER — FENTANYL CITRATE (PF) 100 MCG/2ML IJ SOLN
50.0000 ug | Freq: Once | INTRAMUSCULAR | Status: AC
  Administered 2016-10-23: 50 ug via INTRAVENOUS
  Filled 2016-10-23: qty 2

## 2016-10-23 MED ORDER — IOPAMIDOL (ISOVUE-300) INJECTION 61%
INTRAVENOUS | Status: AC
  Administered 2016-10-23: 100 mL
  Filled 2016-10-23: qty 100

## 2016-10-23 MED ORDER — KETOROLAC TROMETHAMINE 30 MG/ML IJ SOLN
15.0000 mg | Freq: Once | INTRAMUSCULAR | Status: AC
  Administered 2016-10-23: 15 mg via INTRAVENOUS
  Filled 2016-10-23: qty 1

## 2016-10-23 MED ORDER — ONDANSETRON 4 MG PO TBDP: 4.0000 mg | ORAL_TABLET | Freq: Three times a day (TID) | ORAL | 0 refills | Status: DC | PRN

## 2016-10-23 NOTE — ED Notes (Signed)
Bed: MV67 Expected date:  Expected time:  Means of arrival:  Comments: Hold triage

## 2016-10-23 NOTE — ED Notes (Signed)
Patient transported to CT 

## 2016-10-23 NOTE — Discharge Instructions (Signed)
Your ct scan shows uncomplicated diverticulitis. Please take the antibiotics flagyl and cipro as prescribed for 7 days. Patient take NSAID such as ibuprofen or Aleve. May use the hydrocodone for pain that is not controlled with anti-inflammatories. Take Zofran for nausea. If he develops worsening fevers, worsening pain, unable to keep medication down by mouth, or any new symptoms return to the ED. Follow-up with primary care doctor.

## 2016-10-23 NOTE — ED Notes (Signed)
ED Provider at bedside. 

## 2016-10-23 NOTE — ED Triage Notes (Signed)
Pt c/o severe RLQ pain since yesterday. Pt states pain began as intermittent pain and now constant with some nausea. Pt also c/o back pain since Sunday.

## 2016-10-23 NOTE — ED Provider Notes (Signed)
[facility] DEPT Provider Note   CSN: [id number] Arrival date & time: 10/23/16  1131     History   Chief Complaint Chief Complaint  Patient presents with  . Abdominal Pain  . Back Pain    HPI JAMAIA BRUM is a 56 y.o. female.  HPI 56 year old Caucasian female past medical history significant for breast cancer presents to the emergency Department today with complaints of right lower quadrant abdominal pain. Patient states her pain started yesterday and progressively worsened. Describes the pain as sharp and constant in nature. Nothing makes better or worse. She has tried several over-the-counter analgesic without any relief. The patient reports fever at home MAXIMUM TEMPERATURE of 101. Patient also reports nausea but denies any emesis. Reports normal bowel movements with last bowel movement being yesterday. No melena or hematochezia noted. Patient denies any urinary symptoms, vaginal bleeding, vaginal discharge. Patient reports finishing radiation to her left breast 4 weeks ago for her breast cancer. She denies any cough, shortness of breath, chest pain. Patient denies receiving any chemotherapy.  Pt denies any fever, chill, ha, vision changes, lightheadedness, dizziness, congestion, neck pain, cp, sob, cough, v/d, urinary symptoms, change in bowel habits, melena, hematochezia, lower extremity paresthesias.  Past Medical History:  Diagnosis Date  . Anxiety   . Cancer of left breast ([employer])    [facility] genetic testing negative on 05/31/16  . Genetic testing 06/18/2016   Ms. Darrough underwent genetic counseling and testing for hereditary cancer syndromes on 05/31/2016. Her results were negative for mutations in all 46 genes analyzed by [facility]'s 46-gene Common Hereditary Cancers Panel. Genes analyzed include: APC, ATM, AXIN2, BARD1, BMPR1A, BRCA1, BRCA2, BRIP1, CDH1, CDKN2A, CHEK2, CTNNA1, DICER1, EPCAM, GREM1, HOXB13, KIT, MEN1, MLH1, MSH2, MSH3, MSH6, MUTYH, NBN,  . Skin cancer    left breast     Patient Active Problem List   Diagnosis Date Noted  . Genetic testing 06/18/2016  . Malignant neoplasm of upper-inner quadrant of left breast in female, estrogen receptor positive ([place]) 05/25/2016    Past Surgical History:  Procedure Laterality Date  . BREAST LUMPECTOMY WITH RADIOACTIVE SEED AND SENTINEL LYMPH NODE BIOPSY Left 07/04/2016   Procedure: LEFT BREAST LUMPECTOMY WITH RADIOACTIVE SEED X 2 AND SENTINEL LYMPH NODE BIOPSY;  Surgeon: Stark Klein, MD;  Location: [facility];  Service: General;  Laterality: Left;  . surgery on right leg    . TONSILLECTOMY      OB History    No data available       Home Medications    Prior to Admission medications   Medication Sig Start Date End Date Taking? Authorizing Provider  acetaminophen (TYLENOL) 500 MG tablet Take 1,000 mg by mouth every 6 (six) hours as needed for mild pain, moderate pain, fever or headache.   Yes [provider]  Calcium Carb-Cholecalciferol (CALCIUM-VITAMIN D) 500-200 MG-UNIT tablet Take 1 tablet by mouth daily.    Yes [provider]  cholecalciferol (VITAMIN D) 1000 units tablet Take 1,000 Units by mouth daily.   Yes [provider]  docusate sodium (COLACE) 100 MG capsule Take 100 mg by mouth 2 (two) times a week.   Yes [provider]  EPINEPHrine 0.3 mg/0.3 mL IJ SOAJ injection Inject 0.3 mg into the muscle as needed (for allergic reaction).  07/02/16  Yes [provider]  ibuprofen (ADVIL,MOTRIN) 200 MG tablet Take 400 mg by mouth every 6 (six) hours as needed for fever, headache, mild pain, moderate pain or cramping.   Yes  [provider]  letrozole (FEMARA) 2.5 MG tablet Take 1 tablet (2.5 mg total) by mouth daily. 09/27/16  Yes [name], MD  LORazepam (ATIVAN) 0.5 MG tablet 1 tablet po 30 minutes prior to radiation and or qHS prn anxiety Patient taking differently: Take 0.25-0.5 mg by mouth at bedtime as needed for anxiety. 1  tablet po 30 minutes prior to radiation and or qHS prn anxiety 09/04/16  Yes Hayden Pedro, PA-C  oxyCODONE (OXY IR/ROXICODONE) 5 MG immediate release tablet Take 1-2 tablets (5-10 mg total) by mouth every 4 (four) hours as needed for moderate pain, severe pain or breakthrough pain. 07/04/16  Yes Stark Klein, MD  traMADol (ULTRAM) 50 MG tablet Take 1 tablet (50 mg total) by mouth every 6 (six) hours as needed. Patient taking differently: Take 50 mg by mouth every 6 (six) hours as needed for moderate pain.  09/27/16  Yes [name], MD  Travoprost, BAK Free, (TRAVATAN) 0.004 % SOLN ophthalmic solution Place 1 drop into both eyes at bedtime.    Yes [provider]  tretinoin (RETIN-A) 0.025 % cream Apply small amount to face nightly 06/26/16  Yes [provider]  VITAMIN E PO Take 1 tablet by mouth daily.   Yes [provider]  Wound Dressings (RADIAPLEXRX EX) Apply 1 application topically 2 (two) times daily. 08/09/16  Yes Hayden Pedro, PA-C  zolpidem (AMBIEN) 10 MG tablet Take 5 mg by mouth at bedtime as needed for sleep.   Yes [provider]    Family History Family History  Problem Relation Age of Onset  . Breast cancer Mother 18       Contralateral breast cancer a few years later that metastasized.    Social History Social History  Substance Use Topics  . Smoking status: Never Smoker  . Smokeless tobacco: Never Used  . Alcohol use 0.6 oz/week    1 Glasses of wine per week     Comment: Occassional     Allergies   Bee venom; Latex; and Sulfur   Review of Systems Review of Systems  Constitutional: Positive for chills and fever.  HENT: Negative for congestion.   Eyes: Negative for visual disturbance.  Respiratory: Negative for cough and shortness of breath.   Cardiovascular: Negative for chest pain.  Gastrointestinal: Positive for abdominal pain and nausea. Negative for blood in stool, diarrhea and vomiting.  Genitourinary:  Negative for dysuria, flank pain, frequency, hematuria, urgency, vaginal bleeding and vaginal discharge.  Musculoskeletal: Negative for arthralgias and myalgias.  Skin: Negative for rash.  Neurological: Negative for dizziness, syncope, weakness, light-headedness, numbness and headaches.  Psychiatric/Behavioral: Negative for sleep disturbance. The patient is not nervous/anxious.      Physical Exam Updated Vital Signs BP 125/68 (BP Location: Right Arm)   Pulse 90   Temp 98.7 F (37.1 C) (Oral)   Resp 17   LMP 11/29/2006 (Exact Date)   SpO2 100%   Physical Exam  Constitutional: She is oriented to person, place, and time. She appears well-developed and well-nourished.  Non-toxic appearance. No distress.  Patient is very anxious and hyperventilating due to the pain. Appears very uncomfortable due to the pain.  HENT:  Head: Normocephalic and atraumatic.  Nose: Nose normal.  Mouth/Throat: Oropharynx is clear and moist.  Eyes: Pupils are equal, round, and reactive to light. Conjunctivae are normal. Right eye exhibits no discharge. Left eye exhibits no discharge.  Neck: Normal range of motion. Neck supple.  Cardiovascular: Normal rate, regular rhythm, normal  heart sounds and intact distal pulses.  Exam reveals no gallop and no friction rub.   No murmur heard. Pulmonary/Chest: Effort normal and breath sounds normal. No respiratory distress. She has no wheezes. She has no rales. She exhibits no tenderness.  Abdominal: Soft. Bowel sounds are normal. There is tenderness in the right lower quadrant. There is guarding. There is no rigidity, no rebound, no CVA tenderness, no tenderness at McBurney's point and negative Murphy's sign.  Musculoskeletal: Normal range of motion. She exhibits no tenderness.  Lymphadenopathy:    She has no cervical adenopathy.  Neurological: She is alert and oriented to person, place, and time.  Skin: Skin is warm and dry. Capillary refill takes less than 2 seconds.    Psychiatric: Her behavior is normal. Judgment and thought content normal.  Nursing note and vitals reviewed.    ED Treatments / Results  Labs (all labs ordered are listed, but only abnormal results are displayed) Labs Reviewed  COMPREHENSIVE METABOLIC PANEL - Abnormal; Notable for the following:       Result Value   Chloride 100 (*)    Glucose, Bld 112 (*)    Total Protein 8.9 (*)    All other components within normal limits  CBC - Abnormal; Notable for the following:    Hemoglobin 15.4 (*)    MCH 35.6 (*)    MCHC 36.8 (*)    All other components within normal limits  URINALYSIS, ROUTINE W REFLEX MICROSCOPIC - Abnormal; Notable for the following:    Color, Urine STRAW (*)    Specific Gravity, Urine >1.046 (*)    Ketones, ur 5 (*)    All other components within normal limits  LIPASE, BLOOD    EKG  EKG Interpretation None       Radiology Ct Abdomen Pelvis W Contrast  Result Date: 10/23/2016 CLINICAL DATA:  Severe right lower quadrant pain. EXAM: CT ABDOMEN AND PELVIS WITH CONTRAST TECHNIQUE: Multidetector CT imaging of the abdomen and pelvis was performed using the standard protocol following bolus administration of intravenous contrast. CONTRAST:  182m ISOVUE-300 IOPAMIDOL (ISOVUE-300) INJECTION 61% COMPARISON:  None. FINDINGS: Lower chest: Small areas of consolidation within the lingula and peripheral lateral basal segment of the left lower lobe. Hepatobiliary: Subcentimeter low-density lesion in segment 8 is too small to characterize, but statistically likely a cyst. Cholelithiasis. No gallbladder wall thickening or biliary dilatation. Pancreas: Unremarkable. No pancreatic ductal dilatation or surrounding inflammatory changes. Spleen: Normal in size without focal abnormality. Adrenals/Urinary Tract: The adrenal glands are unremarkable. Punctate nonobstructive calculus in the lower pole of the left kidney. No hydronephrosis. The bladder is unremarkable. Stomach/Bowel: There is  a focal area of wall thickening involving a short segment of proximal transverse colon surrounding an inflamed diverticulum, consistent with diverticulitis. Normal appendix. The stomach and small bowel are unremarkable. Vascular/Lymphatic: Aortic atherosclerosis. No enlarged abdominal or pelvic lymph nodes. Reproductive: Uterus and bilateral adnexa are unremarkable. Other: Trace free fluid in the pelvis. No pneumoperitoneum or drainable fluid collection. Musculoskeletal: No acute or significant osseous findings. IMPRESSION: 1. Findings consistent with acute diverticulitis of the proximal transverse colon. No evidence of microperforation or drainable fluid collection. 2. Small areas of consolidation within the lingula and peripheral lateral basal segment of the left lower lobe, which could be infectious or inflammatory in etiology. 3. Punctate nonobstructive left nephrolithiasis 4.  Aortic atherosclerosis (ICD10-I70.0). Electronically Signed   By: WTitus DubinM.D.   On: 10/23/2016 14:49    Procedures Procedures (including critical care time)  Medications Ordered in ED Medications  fentaNYL (SUBLIMAZE) injection 50 mcg (50 mcg Intravenous Given 10/23/16 1308)  ondansetron (ZOFRAN) injection 4 mg (4 mg Intravenous Given 10/23/16 1308)  sodium chloride 0.9 % bolus 1,000 mL (0 mLs Intravenous Stopped 10/23/16 1415)  iopamidol (ISOVUE-300) 61 % injection (100 mLs  Contrast Given 10/23/16 1426)  ciprofloxacin (CIPRO) tablet 500 mg (500 mg Oral Given 10/23/16 1512)  metroNIDAZOLE (FLAGYL) tablet 500 mg (500 mg Oral Given 10/23/16 1532)  HYDROcodone-acetaminophen (NORCO/VICODIN) 5-325 MG per tablet 1 tablet (1 tablet Oral Given 10/23/16 1531)  ketorolac (TORADOL) 30 MG/ML injection 15 mg (15 mg Intravenous Given 10/23/16 1533)     Initial Impression / Assessment and Plan / ED Course  I have reviewed the triage vital signs and the nursing notes.  Pertinent labs & imaging results that were available during my care of  the patient were reviewed by me and considered in my medical decision making (see chart for details).     Patient resents to the ED with complaints of right lower quadrant abdominal pain for the past one day. Reports associated fever, nausea. Denies any associated urinary symptoms, vaginal symptoms, emesis.  Vital signs are reassuring. Patient is afebrile, no tachycardia, no hypotension.  On exam patient appears very uncomfortable due to pain in her right lower quadrant. Patient is very anxious and was hyperventilating initially due to the pain however she was able to calm herself and slow her breathing. Patient with guarding of the right lower quadrant. No rigid abdomen noted. No CVA tenderness. Lungs are clear to auscultation bilaterally.  Patient's lab work is reassuring. No leukocytosis. Mild hemoconcentration with hemoglobin of 15.4. Lipase is normal. UA shows no signs of infection.  Concern for possible appendicitis versus diverticulitis. CT of abdomen was ordered.  CT returned that showed acute diverticulitis that was uncomplicated without signs of microperforation or abscess. Of note CT scan does show some consolidation in the left lower lung consistent with inflammation versus infection. Patient did recently finished radiation 3 weeks ago of her left breast. Likely pneumonitis from the radiation. Patient has no pulmonary complaints including cough, shortness breath, chest pain. Patient informed of finding and will need close follow-up with her PCP.  Pain was managed in the ED appropriately. Patient able to tolerate by mouth fluid and food without any difficulties. Repeat abdominal exam is benign without any signs of peritonitis. Patient was given Cipro and Flagyl. Will be discharged home with Cipro, Flagyl, Zofran, hydrocodone. Discussed symptomatic treatment at home.  Pt is hemodynamically stable, in NAD, & able to ambulate in the ED. Evaluation does not show pathology that would require  ongoing emergent intervention or inpatient treatment. I explained the diagnosis to the patient. Pain has been managed & has no complaints prior to dc. Pt is comfortable with above plan and is stable for discharge at this time. All questions were answered prior to disposition. Strict return precautions for f/u to the ED were discussed. Encouraged follow up with PCP.  Dicussed with Dr. Ellender Hose who is agreeable to the above plan.   Final Clinical Impressions(s) / ED Diagnoses   Final diagnoses:  Diverticulitis    New Prescriptions New Prescriptions   CIPROFLOXACIN (CIPRO) 500 MG TABLET    Take 1 tablet (500 mg total) by mouth every 12 (twelve) hours.   HYDROCODONE-ACETAMINOPHEN (NORCO/VICODIN) 5-325 MG TABLET    Take 2 tablets by mouth every 4 (four) hours as needed.   METRONIDAZOLE (FLAGYL) 500 MG TABLET    Take 1  tablet (500 mg total) by mouth 3 (three) times daily. DO NOT CONSUME ALCOHOL WHILE TAKING THIS MEDICATION.   ONDANSETRON (ZOFRAN-ODT) 4 MG DISINTEGRATING TABLET    Take 1 tablet (4 mg total) by mouth every 8 (eight) hours as needed for nausea.     Doristine Devoid, PA-C 10/23/16 1700    Duffy Bruce, MD 10/25/16 1116

## 2016-10-26 ENCOUNTER — Telehealth: Payer: Self-pay | Admitting: *Deleted

## 2016-10-26 NOTE — Telephone Encounter (Signed)
  Oncology Nurse Navigator Documentation  Navigator Location: [facility] (10/26/16 1000)   )Navigator Encounter Type: Telephone (10/26/16 1000) Telephone: Outgoing Call;Medication Assistance (10/26/16 1000)  Discussed anti-nausea medications and constipation relief                         Interventions: Psycho-social support (10/26/16 1000)                      Time Spent with Patient: 30 (10/26/16 1000)

## 2016-10-29 ENCOUNTER — Ambulatory Visit
Admission: RE | Admit: 2016-10-29 | Discharge: 2016-10-29 | Disposition: A | Discharge status: Home / Self Care | Payer: BLUE CROSS/BLUE SHIELD | Source: Ambulatory Visit | Attending: Hematology | Admitting: Hematology

## 2016-10-29 DIAGNOSIS — E2839 Other primary ovarian failure: Secondary | ICD-10-CM

## 2016-10-31 ENCOUNTER — Ambulatory Visit
Admission: RE | Admit: 2016-10-31 | Payer: BLUE CROSS/BLUE SHIELD | Source: Ambulatory Visit | Admitting: Radiation Oncology

## 2016-11-14 ENCOUNTER — Encounter: Payer: Self-pay | Admitting: Radiation Oncology

## 2016-11-14 ENCOUNTER — Ambulatory Visit
Admission: RE | Admit: 2016-11-14 | Discharge: 2016-11-14 | Disposition: A | Discharge status: Home / Self Care | Payer: BLUE CROSS/BLUE SHIELD | Source: Ambulatory Visit | Attending: Radiation Oncology | Admitting: Radiation Oncology

## 2016-11-14 VITALS — BP 119/64 | HR 70 | Temp 98.2°F | Resp 18 | Ht 63.0 in | Wt 127.4 lb

## 2016-11-14 DIAGNOSIS — Z923 Personal history of irradiation: Secondary | ICD-10-CM | POA: Diagnosis not present

## 2016-11-14 DIAGNOSIS — C50912 Malignant neoplasm of unspecified site of left female breast: Secondary | ICD-10-CM | POA: Insufficient documentation

## 2016-11-14 DIAGNOSIS — C50212 Malignant neoplasm of upper-inner quadrant of left female breast: Secondary | ICD-10-CM

## 2016-11-14 DIAGNOSIS — Z170 Estrogen receptor positive status [ER+]: Secondary | ICD-10-CM | POA: Diagnosis not present

## 2016-11-14 NOTE — Progress Notes (Signed)
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 During treatment she did very well with radiotherapy and did not have significant desquamation.                             On review of systems, the patient states she's doing well since her last visit now that she was treated for diverticulitis earlier this month. She reports she's doing well with her endocrine therapy. She denies any concerns with her skin at this time. No other complaints are noted.  ALLERGIES:  is allergic to bee venom; latex; and sulfur.  Meds: Current Outpatient Prescriptions  Medication Sig Dispense Refill  . acetaminophen (TYLENOL) 500 MG tablet Take 1,000 mg by mouth every 6 (six) hours as needed for mild pain, moderate pain, fever or headache.    . Calcium Carb-Cholecalciferol (CALCIUM-VITAMIN D) 500-200 MG-UNIT tablet Take 1 tablet by mouth daily.     . cholecalciferol (VITAMIN D) 1000 units tablet Take 1,000 Units by mouth daily.    . ciprofloxacin (CIPRO) 500 MG tablet Take 1 tablet (500 mg total) by mouth every 12 (twelve) hours. 14 tablet 0  . docusate sodium (COLACE) 100 MG capsule Take 100 mg by mouth 2 (two) times a week.    Marland Kitchen EPINEPHrine 0.3 mg/0.3 mL IJ SOAJ injection Inject 0.3 mg into the muscle as needed (for allergic reaction).     Marland Kitchen HYDROcodone-acetaminophen (NORCO/VICODIN) 5-325 MG tablet Take 2 tablets by mouth every 4 (four) hours as needed. 15 tablet 0  . ibuprofen (ADVIL,MOTRIN) 200 MG tablet  Take 400 mg by mouth every 6 (six) hours as needed for fever, headache, mild pain, moderate pain or cramping.    Marland Kitchen letrozole (FEMARA) 2.5 MG tablet Take 1 tablet (2.5 mg total) by mouth daily. 30 tablet 2  . LORazepam (ATIVAN) 0.5 MG tablet 1 tablet po 30 minutes prior to radiation and or qHS prn anxiety (Patient taking differently: Take 0.25-0.5 mg by mouth at bedtime as needed for anxiety. 1 tablet po 30 minutes prior to radiation and or qHS prn anxiety) 60 tablet 0  . metroNIDAZOLE (FLAGYL) 500 MG tablet Take 1 tablet (500 mg total) by mouth 3 (three) times daily. DO NOT CONSUME ALCOHOL WHILE TAKING THIS MEDICATION. 21 tablet 0  . ondansetron (ZOFRAN-ODT) 4 MG disintegrating tablet Take 1 tablet (4 mg total) by mouth every 8 (eight) hours as needed for nausea. 10 tablet 0  . oxyCODONE (OXY IR/ROXICODONE) 5 MG immediate release tablet Take 1-2 tablets (5-10 mg total) by mouth every 4 (four) hours as needed for moderate pain, severe pain or breakthrough pain. 40 tablet 0  . traMADol (ULTRAM) 50 MG tablet Take 1 tablet (50 mg total) by mouth every 6 (six) hours as needed. (Patient taking differently: Take 50 mg by mouth every 6 (six) hours as needed for moderate pain. )  30 tablet 0  . Travoprost, BAK Free, (TRAVATAN) 0.004 % SOLN ophthalmic solution Place 1 drop into both eyes at bedtime.     . tretinoin (RETIN-A) 0.025 % cream Apply small amount to face nightly    . VITAMIN E PO Take 1 tablet by mouth daily.    . Wound Dressings (RADIAPLEXRX EX) Apply 1 application topically 2 (two) times daily.    Marland Kitchen zolpidem (AMBIEN) 10 MG tablet Take 5 mg by mouth at bedtime as needed for sleep.     No current facility-administered medications for this encounter.     Physical Findings:  vitals were not taken for this visit.  /10 In general this is a well appearing caucasian female in no acute distress. She's alert and oriented x4 and appropriate throughout the examination. Cardiopulmonary assessment is negative  for acute distress and she exhibits normal effort. The left breast was examined and reveals only mild changes at the medial aspect of her lumpectomy incision consistent with hyperpigmentation from treatment. No desquamation is noted.  Lab Findings: Lab Results  Component Value Date   WBC 9.9 10/23/2016   HGB 15.4 (H) 10/23/2016   HCT 41.8 10/23/2016   MCV 96.5 10/23/2016   PLT 223 10/23/2016     Radiographic Findings: Ct Abdomen Pelvis W Contrast  Result Date: 10/23/2016 CLINICAL DATA:  Severe right lower quadrant pain. EXAM: CT ABDOMEN AND PELVIS WITH CONTRAST TECHNIQUE: Multidetector CT imaging of the abdomen and pelvis was performed using the standard protocol following bolus administration of intravenous contrast. CONTRAST:  117mL ISOVUE-300 IOPAMIDOL (ISOVUE-300) INJECTION 61% COMPARISON:  None. FINDINGS: Lower chest: Small areas of consolidation within the lingula and peripheral lateral basal segment of the left lower lobe. Hepatobiliary: Subcentimeter low-density lesion in segment 8 is too small to characterize, but statistically likely a cyst. Cholelithiasis. No gallbladder wall thickening or biliary dilatation. Pancreas: Unremarkable. No pancreatic ductal dilatation or surrounding inflammatory changes. Spleen: Normal in size without focal abnormality. Adrenals/Urinary Tract: The adrenal glands are unremarkable. Punctate nonobstructive calculus in the lower pole of the left kidney. No hydronephrosis. The bladder is unremarkable. Stomach/Bowel: There is a focal area of wall thickening involving a short segment of proximal transverse colon surrounding an inflamed diverticulum, consistent with diverticulitis. Normal appendix. The stomach and small bowel are unremarkable. Vascular/Lymphatic: Aortic atherosclerosis. No enlarged abdominal or pelvic lymph nodes. Reproductive: Uterus and bilateral adnexa are unremarkable. Other: Trace free fluid in the pelvis. No pneumoperitoneum or drainable fluid  collection. Musculoskeletal: No acute or significant osseous findings. IMPRESSION: 1. Findings consistent with acute diverticulitis of the proximal transverse colon. No evidence of microperforation or drainable fluid collection. 2. Small areas of consolidation within the lingula and peripheral lateral basal segment of the left lower lobe, which could be infectious or inflammatory in etiology. 3. Punctate nonobstructive left nephrolithiasis 4.  Aortic atherosclerosis (ICD10-I70.0). Electronically Signed   By: Titus Dubin M.D.   On: 10/23/2016 14:49   Dg Bone Density  Result Date: 10/29/2016 EXAM: DUAL X-RAY ABSORPTIOMETRY (DXA) FOR BONE MINERAL DENSITY IMPRESSION: Referring Physician:  [name] PATIENT: Name: Damariz, Paganelli Patient ID: [id number] Birth Date: 08/11/1960 Height: 62.8 in. Sex: Female Measured: 10/29/2016 Weight: 129.4 lbs. Indications: Breast Cancer History, Caucasian, Estrogen Deficient, Femara, Postmenopausal Fractures: None Treatments: Calcium (E943.0), Hormone Therapy For Cancer, Vitamin D (E933.5) ASSESSMENT: The BMD measured at Femur Neck Left is 0.807 g/cm2 with a T-score of -1.7. This patient is considered OSTEOPENIC according to [facility]) criteria. L3 was excluded due to  degenerative changes. Patient is not a candidate for FRAX assessment. Site Region Measured Date Measured Age YA T-score BMD Significant CHANGE DualFemur Neck Left  8/29/2016    57.8         -1.7    0.807 g/cm2 AP Spine  L1-L4 (L3) 10[age]29/2016    57.8         -0.9    1.077 g/cm2 World Health Organization [facility]) criteria for post-menopausal, Caucasian Women: Normal       T-score at or above -1 SD Osteopenia   T-score between -1 and -2.5 SD Osteoporosis T-score at or below -2.5 SD RECOMMENDATION: [facility] recommends that FDA-approved medical therapies be considered in postmenopausal women and men age 35 or older with a: 1. Hip or vertebral (clinical or morphometric) fracture.  2. T-score of <-2.5 at the spine or hip. 3. Ten-year fracture probability by FRAX of 3% or greater for hip fracture or 20% or greater for major osteoporotic fracture. All treatment decisions require clinical judgment and consideration of individual patient factors, including patient preferences, co-morbidities, previous drug use, risk factors not captured in the FRAX model (e.g. falls, vitamin D deficiency, increased bone turnover, interval significant decline in bone density) and possible under - or over-estimation of fracture risk by FRAX. All patients should ensure an adequate intake of dietary calcium (1200 mg/d) and vitamin D (800 IU daily) unless contraindicated. FOLLOW-UP: People with diagnosed cases of osteoporosis or at high risk for fracture should have regular bone mineral density tests. For patients eligible for Medicare, routine testing is allowed once every 2 years. The testing frequency can be increased to one year for patients who have rapidly progressing disease, those who are receiving or discontinuing medical therapy to restore bone mass, or have additional risk factors. I have reviewed this report, and agree with the above findings. Mark A. Thornton Papas, M.D. [facility] FRAX* 10-year Probability of Fracture Based on femoral neck BMD: DualFemur (Left) Major Osteoporotic Fracture: 7.6% Hip Fracture:                0.7% Population:                  [place] (Caucasian) Risk Factors:                None *FRAX is a Materials engineer of the [employer] for Metabolic Bone Disease, a World Pharmacologist (WHO) [facility]. ASSESSMENT: The probability of a major osteoporotic fracture is 7.6 % within the next ten years. The probability of a hip fracture is 0.7 % within the next ten years. Electronically Signed   By: Lavonia Dana M.D.   On: 10/29/2016 15:32    Impression/Plan: 1. Stage IA, pT2,N0,M0 grade 1, ER/PR positive invasive lobular carcinoma with LCISof  the left breast. The patient has been doing well since completion of radiotherapy. We discussed that we would be happy to continue to follow her as needed, but she will also continue her endocrine therapy, and follow up with Dr. Burr Medico in medical oncology. She was counseled on skin care as well as measures to avoid sun exposure to this area.  2. Survivorship. The patient will follow-up as clinically appropriate as well. We discussed the role for this visit.     Carola Rhine, PAC

## 2016-11-14 NOTE — Addendum Note (Signed)
Encounter addended by: Malena Edman, RN on: 11/14/2016  9:50 AM<BR>    Actions taken: Charge Capture section accepted

## 2016-12-12 ENCOUNTER — Other Ambulatory Visit: Payer: Self-pay

## 2016-12-12 MED ORDER — LETROZOLE 2.5 MG PO TABS: 2.5000 mg | ORAL_TABLET | Freq: Every day | ORAL | 2 refills | Status: DC

## 2016-12-26 ENCOUNTER — Telehealth: Payer: Self-pay | Admitting: Adult Health

## 2016-12-26 NOTE — Telephone Encounter (Signed)
Called the patient regarding appt for 11/16 - per SCP and f/u with Dr. Burr Medico on the same day and insurance will not cover both - patient is aware and rescheduled the SCP to 11/29. - message sent to Dr. Burr Medico and Charlestine Massed

## 2017-01-01 ENCOUNTER — Encounter: Payer: BLUE CROSS/BLUE SHIELD | Admitting: Adult Health

## 2017-01-03 NOTE — Progress Notes (Signed)
[place]  [phone] [phone]  Clinic Follow Up Note   Patient Care Team: Sally Koch, MD as PCP - General (Obstetrics and Gynecology) Sally Merle, MD as Consulting Physician (Hematology) Sally Klein, MD as Consulting Physician (General Surgery) Sally Rudd, MD as Consulting Physician (Radiation Oncology) 01/04/2017  CHIEF COMPLAINTS:  Left breast cancer  Oncology History   Cancer Staging Malignant neoplasm of upper-inner quadrant of left breast in female, estrogen receptor positive ([place]) Staging form: Breast, AJCC 8th Edition - Clinical stage from 05/22/2016: Stage IA (cT1a, cN0, cM0, G2, ER: Positive, PR: Positive, HER2: Negative) - Signed by Sally Merle, MD on 05/29/2016       Malignant neoplasm of upper-inner quadrant of left breast in female, estrogen receptor positive ([place])   05/10/2016 Mammogram    Bilateral screening mammogram on 05/10/16 showed a possible distortion with calcifications in the left breast.      05/16/2016 Mammogram    Diagnostic mammogram and US showed Persistent distortion in the upper inner quadrant of the left breast possibly correlating with the sonographic area of acoustic shadowing, measuring about 22m. [place] left axilla (-)      05/22/2016 Receptors her2    ER 100%, PR 70%+, Ki67 5%      05/22/2016 Initial Biopsy    Diagnosis Breast, left, needle core biopsy, upper inner - INVASIVE AND IN SITU LOBULAR CARCINOMA, G1-2      05/22/2016 Initial Diagnosis    Malignant neoplasm of upper-inner quadrant of left breast in female, estrogen receptor positive ([place]      05/31/2016 Genetic Testing    Genetic counseling and testing for hereditary cancer syndromes performed on 05/31/2016. Results are negative for pathogenic mutations in 46 genes analyzed by [facility]'s Common Hereditary Cancers Panel. Results are dated 06/11/2016. Genes analyzed include: APC, ATM, AXIN2, BARD1, BMPR1A, BRCA1, BRCA2, BRIP1, CDH1, CDKN2A,  CHEK2, CTNNA1, DICER1, EPCAM, GREM1, HOXB13, KIT, MEN1, MLH1, MSH2, MSH3, MSH6, MUTYH, NBN, NF1, NTHL1, PALB2, PDGFRA, PMS2, POLD1, POLE, PTEN, RAD50, RAD51C, RAD51D, SDHA, SDHB, SDHC, SDHD, SMAD4, SMARCA4, STK11, TP53, TSC1, TSC2, and VHL.       07/01/2016 Oncotype testing    Oncotype testing showed reoccurance score of 11. With 10 year risk of distant reoccurrence 7% with tamoxifen alone      07/04/2016 Surgery    Left breast lumpectomy with radioactive seed x2 and sentinel lymph node biopsy by Dr. BBarry Smith     08/08/2016 - 09/24/2016 Radiation Therapy    Patient underwent radiation with Dr.Moody      09/2016 -  Anti-estrogen oral therapy    Letrozole 2.5 mg daily starting 09/2016       HISTORY OF PRESENTING ILLNESS (05/30/2016):  Sally Flurry89y.o. female is here because of a new diagnosis of left breast cancer.  Bilateral screening mammogram on 05/10/16 showed a possible distortion with calcifications in the left breast. Diagnostic left breast mammogram on 05/16/16 showed no suspicious microcalcifications in the left breast, but did confirm the presence of distortion in the upper inner quadrant of the left breast. Other areas of questioned distortion was not persistent on further evaluation. [place]of the left breast showed a vague area of acoustic shadowing in the 11:30 position 3 cm from the nipple measuring about  0.6 cm and associated with adjacent vascularity on Doppler evaluation. The left axilla was negative for adenopathy.  Biopsy of the UIQ of the left breast on 05/22/16 revealed grade 1-2 revealed invasive lobular carcinoma and LCIS. ER  100%+, PR 70%+, Ki67 5%  The patient presents today with her husband in multidisciplinary breast clinic to discuss treatment options for the management of her disease. The patient's breast density category is C: The breast tissue is heterogeneously dense, which may obscure small masses.  GYN HISTORY  Menarchal: 9 LMP: 48 Contraceptive:  Post-menopause HRT: No GP: G3P2, one child miscarriage  Denies pain, discomfort, arthritis, changes in appetite or weight, HTN, diabetes, or hyperlipidemia. The patient reports cervical cancer at age 69. She had surgical intervention at [facility]. She's has 2 children since then. She had skin cancer of the right lower extremity about 10 years ago by Dr. Rhona Smith. She also had removal of basal cell carcinoma of the right face a few years ago. She has glaucoma. She reports having bad hot flashes when she entered menopause. She has hot flashes 1-2 times a month now.  CURRENT THERAPY: Letrozole 2.5 mg daily starting 09/2016    INTERIM HISTORY Sally Smith is here for a follow up. She presents to the clinic today accompanied by her husband. She started Letrozole and at first had joint stiffness and hot flashes but she is tolerating well now. She plans to get medical massages to help with this.  She presented to the ED in 10/2016 due to Diverticulitis. That was the first time and has now resolved. She did not have a colonoscopy at that time but did in the past. She thinks her skin is back to normal from radiation. She deferred breast exam today. She needs a refill for Ativan and takes 0.93m daily and another 0.568mas needed. She uses it for anxiety and was originally given by her PCP. She sees PCP next Spring.    MEDICAL HISTORY:  Past Medical History:  Diagnosis Date  . Anxiety   . Cancer of left breast ([place]   [facility] genetic testing negative on 05/31/16  . Genetic testing 06/18/2016   Ms. Sally Smith underwent genetic counseling and testing for hereditary cancer syndromes on 05/31/2016. Her results were negative for mutations in all 46 genes analyzed by [facility]'s 46-gene Common Hereditary Cancers Panel. Genes analyzed include: APC, ATM, AXIN2, BARD1, BMPR1A, BRCA1, BRCA2, BRIP1, CDH1, CDKN2A, CHEK2, CTNNA1, DICER1, EPCAM, GREM1, HOXB13, KIT, MEN1, MLH1, MSH2, MSH3, MSH6, MUTYH, NBN,  . Skin cancer     left breast     SURGICAL HISTORY: Past Surgical History:  Procedure Laterality Date  . LEFT BREAST LUMPECTOMY WITH RADIOACTIVE SEED X 2 AND SENTINEL LYMPH NODE BIOPSY Left 07/04/2016   [facility]    . TONSILLECTOMY      SOCIAL HISTORY: Social History   Socioeconomic History  . Marital status: Married    Spouse name: Not on file  . Number of children: Not on file  . Years of education: Not on file  . Highest education level: Not on file  Social Needs  . Financial resource strain: Not on file  . Food insecurity - worry: Not on file  . Food insecurity - inability: Not on file  . Transportation needs - medical: Not on file  . Transportation needs - non-medical: Not on file  Occupational History  . Not on file  Tobacco Use  . Smoking status: Never Smoker  . Smokeless tobacco: Never Used  Substance and Sexual Activity  . Alcohol use: Yes    Alcohol/week: 0.6 oz    Types: 1 Glasses of wine per week  Comment: Occassional  . Drug use: No  . Sexual activity: Yes    Birth control/protection: Surgical    Comment: husband had vasectomy  Other Topics Concern  . Not on file  Social History Narrative  . Not on file    FAMILY HISTORY: Family History  Problem Relation Age of Onset  . Breast cancer Mother 71       Contralateral breast cancer a few years later that metastasized.    ALLERGIES:  is allergic to bee venom; latex; and sulfur.  MEDICATIONS:  Current Outpatient Medications  Medication Sig Dispense Refill  . acetaminophen (TYLENOL) 500 MG tablet Take 1,000 mg by mouth every 6 (six) hours as needed for mild pain, moderate pain, fever or headache.    . Calcium Carb-Cholecalciferol (CALCIUM-VITAMIN D) 500-200 MG-UNIT tablet Take 1 tablet by mouth daily.     . cholecalciferol (VITAMIN D) 1000 units tablet Take 1,000 Units by mouth daily.    Marland Kitchen docusate sodium (COLACE) 100 MG capsule Take 100 mg by  mouth 2 (two) times a week.    Marland Kitchen ibuprofen (ADVIL,MOTRIN) 200 MG tablet Take 400 mg by mouth every 6 (six) hours as needed for fever, headache, mild pain, moderate pain or cramping.    Marland Kitchen letrozole (FEMARA) 2.5 MG tablet Take 1 tablet (2.5 mg total) by mouth daily. 30 tablet 2  . LORazepam (ATIVAN) 0.5 MG tablet Take 0.5-1 tablets (0.25-0.5 mg total) at bedtime as needed by mouth for anxiety. 1 tablet po 30 minutes prior to radiation and or qHS prn anxiety 30 tablet 2  . Travoprost, BAK Free, (TRAVATAN) 0.004 % SOLN ophthalmic solution Place 1 drop into both eyes at bedtime.     . tretinoin (RETIN-A) 0.025 % cream Apply small amount to face nightly    . VITAMIN E PO Take 1 tablet by mouth daily.    Marland Kitchen zolpidem (AMBIEN) 10 MG tablet Take 5 mg by mouth at bedtime as needed for sleep.    Marland Kitchen EPINEPHrine 0.3 mg/0.3 mL IJ SOAJ injection Inject 0.3 mg into the muscle as needed (for allergic reaction).     Marland Kitchen HYDROcodone-acetaminophen (NORCO/VICODIN) 5-325 MG tablet Take 2 tablets by mouth every 4 (four) hours as needed. (Patient not taking: Reported on 11/14/2016) 15 tablet 0  . ondansetron (ZOFRAN-ODT) 4 MG disintegrating tablet Take 1 tablet (4 mg total) by mouth every 8 (eight) hours as needed for nausea. (Patient not taking: Reported on 01/04/2017) 10 tablet 0  . traMADol (ULTRAM) 50 MG tablet Take 1 tablet (50 mg total) by mouth every 6 (six) hours as needed. (Patient not taking: Reported on 01/04/2017) 30 tablet 0   No current facility-administered medications for this visit.     REVIEW OF SYSTEMS:   Constitutional: Denies fevers, chills or abnormal night sweats (+) mild hot flashes Eyes: Denies blurriness of vision, double vision or watery eyes Ears, nose, mouth, throat, and face: Denies mucositis or sore throat Respiratory: Denies cough, dyspnea or wheezes Cardiovascular: Denies palpitation, chest discomfort or lower extremity swelling Gastrointestinal:  Denies nausea, heartburn or change in bowel  habits Skin: Denies abnormal skin rashes  Lymphatics: Denies new lymphadenopathy or easy bruising MSK: (+) joint stiffness Neurological:Denies numbness, tingling or new weaknesses Behavioral/Psych:(+)anxiety All other systems were reviewed with the patient and are negative.  PHYSICAL EXAMINATION:  ECOG PERFORMANCE STATUS: 0 - Asymptomatic  Vitals:   01/04/17 1052  BP: 139/63  Pulse: 65  Resp: 17  Temp: 98.5 F (36.9 C)  SpO2: 98%  Filed Weights   01/04/17 1052  Weight: 126 lb 9.6 oz (57.4 kg)    GENERAL:alert, no distress and comfortable SKIN: skin color, texture, turgor are normal, no rashes or significant lesions EYES: normal, conjunctiva are pink and non-injected, sclera clear OROPHARYNX:no exudate, no erythema and lips, buccal mucosa, and tongue normal  NECK: supple, thyroid normal size, non-tender, without nodularity LYMPH:  no palpable lymphadenopathy in the cervical, axillary or inguinal LUNGS: clear to auscultation and percussion with normal breathing effort HEART: regular rate & rhythm and no murmurs and no lower extremity edema ABDOMEN:abdomen soft, non-tender and normal bowel sounds Musculoskeletal:no cyanosis of digits and no clubbing  PSYCH: alert & oriented x 3 with fluent speech NEURO: no focal motor/sensory deficits Breast: Breast inspection showed them to be symmetrical with no nipple discharge.  Mild diffused skin around surgical incision around areola healed well, no scarred tissue. No masses I could appreciated, exam deferred today   LABORATORY DATA:  I have reviewed the data as listed CBC Latest Ref Rng & Units 01/04/2017 10/23/2016 05/30/2016  WBC 3.9 - 10.3 10e3/uL 4.3 9.9 5.1  Hemoglobin 11.6 - 15.9 g/dL 13.5 15.4(H) 14.2  Hematocrit 34.8 - 46.6 % 39.5 41.8 41.1  Platelets 145 - 400 10e3/uL 166 223 169   CMP Latest Ref Rng & Units 01/04/2017 10/23/2016 05/30/2016  Glucose 70 - 140 mg/dl 85 112(H) 85  BUN 7.0 - 26.0 mg/dL 7.6 8 11.3  Creatinine 0.6  - 1.1 mg/dL 0.8 0.63 0.7  Sodium 136 - 145 mEq/L 136 136 140  Potassium 3.5 - 5.1 mEq/L 3.6 3.7 3.5  Chloride 101 - 111 mmol/L - 100(L) -  CO2 22 - 29 mEq/L _0 Calcium 8.4 - 10.4 mg/dL 9.6 10.3 9.7  Total Protein 6.4 - 8.3 g/dL 7.8 8.9(H) 7.8  Total Bilirubin 0.20 - 1.20 mg/dL 0.40 1.1 0.59  Alkaline Phos 40 - 150 U/L 87 124 74  AST 5 - 34 U/L 22 33 24  ALT 0 - 55 U/L 30 54 35    PATHOLOGY   1   Diagnosis 07/04/2016 1. Breast, lumpectomy, Left - INVASIVE AND IN SITU LOBULAR CARCINOMA, 2.8 CM. - INVASIVE CARCINOMA FOCALLY INVOLVES POSTERIOR AND ANTERIOR MARGINS. - LOBULAR CARCINOMA IN SITU FOCALLY LESS THAN 0.1 CM FROM POSTERIOR MARGIN - PREVIOUS BIOPSY SITE. 2. Lymph node, sentinel, biopsy, Left Axillary #1 - ONE BENIGN LYMPH NODE (0/1). 3. Lymph node, sentinel, biopsy, Left Axillary #2 - ONE BENIGN LYMPH NODE (0/1). 4. Lymph node, sentinel, biopsy, Left - ONE BENIGN LYMPH NODE (0/1). 5. Lymph node, sentinel, biopsy, Left Axillary #3 - ONE BENIGN LYMPH NODE (0/1). Microscopic Comment 1. BREAST, INVASIVE TUMOR Procedure: Localized lumpectomy and four sentinel lymph nodes Laterality: Left breast Tumor Size: 2.8 cm Histologic Type: Lobular Grade: I Tubular Differentiation: 3 Nuclear Pleomorphism: 1 Mitotic Count: 1 Ductal Carcinoma in Situ (DCIS): No Extent of Tumor: Skin: N/A Nipple: N/A Skeletal muscle: N/A Margins: Anterior and posterior margins focally involved by invasive carcinoma. Invasive carcinoma, distance from closest margin: Focally 0 cm from anterior and posterior margin 1 of 3 FINAL for Sally Smith, Sally Smith ([id number]) Microscopic Comment(continued) DCIS, distance from closest margin: Focally less than 0.1 cm from posterior margin Regional Lymph Nodes: Number of Lymph Nodes Examined: 4 Number of Sentinel Lymph Nodes Examined: 4 Lymph Nodes with Macrometastases: 0 Lymph Nodes with Micrometastases: 0 Lymph Nodes with Isolated Tumor Cells:  0 Breast Prognostic Profile: Case [id number] Estrogen Receptor: 100%, positive, strong staining Progesterone Receptor: 70%,  positive, strong staining Her2: Negative, ratio 1.45 Ki-67: 5% Best tumor block for sendout testing: 1-k Pathologic Stage Classification (pTNM, AJCC 8th Edition): Primary Tumor (pT): pT2 Regional Lymph Nodes (pN): pN0 Distant Metastases (pM): pMX Comments: Immunohistochemistry for cytokeratin AE1/AE3 is performed on the sentinel lymph nodes (parts 2-5) and no positivity is identified. (JDP:kh 07/05/16.  Diagnosis Breast, left, needle core biopsy, upper inner - INVASIVE AND IN SITU MAMMARY CARCINOMA. - SEE COMMENT. Microscopic Comment The invasive carcinoma appears grade 1-2. An E-cadherin stain and a breast prognostic profile will be performed and the results reported separately. The results were called to [facility] on 05/23/16. (JBK:gt, 05/23/16) Enid Cutter MD Pathologist, Electronic Signature (Case signed 05/23/2016)  RADIOGRAPHIC STUDIES: I have personally reviewed the radiological images as listed and agreed with the findings in the report. No results found.    Bone Density 10/29/16 ASSESSMENT: The BMD measured at Femur Neck Left is 0.807 g/cm2 with a T-score of -1.7. This patient is considered OSTEOPENIC according to [place] [facility]) criteria. L3 was excluded due to degenerative changes. Patient is not a candidate for FRAX assessment. Site Region Measured Date Measured Age YA T-score BMD Significant CHANGE DualFemur Neck Left  10/29/2016    57.6         -1.5    0.807 g/cm2 AP Spine  L1-L4 (L3) 10/29/2016    57.6         -0.9    1.077 g/cm2   ASSESSMENT & PLAN: 56 y.o. Caucasian female with screening detected left breast cancer.  1. 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 -the Oncotype Dx result was reviewed with her in details. She has low risk based on the recurrence score, which predicts 10 year distant recurrence after 5 years of tamoxifen 7%. She does not need adjuvant chemotherapy based on the low recurrence score. -She now has completed adjuvant breast radiation, tolerated well overall. -Giving the strong ER and PR expression in her postmenopausal status, I recommend adjuvant endocrine therapy with aromatase inhibitor for a total of 5-10 years to reduce the risk of cancer recurrence. Given her T2 lesion, and lobular histology, I recommend 7 years therapy if she tolerates well. The side effects were discussed in detail. She agreed.  -We previously discussed the breast cancer surveillance after her surgery. She will continue annual screening mammogram, self exam, and a routine office visit with lab and exam with [place]. -She started letrozole in 09/2016 and tolerating well with mild joint stiffness and hot flashes. I encouraged her to be active and massages are fine. If worsens she can take tylenol.  -Labs reviewed, CBC WNL, I will notify her when her CMP return -Will continue with breast surveillance, next mammogram in 04/2017 -survivorship clinic in 2 months  -F/u in 5 months    2. Left breast pain -secondary to breast surgery and radiation -much improved now, she is off narcotics   3. Osteopenia  -She has not had a bone density scan for many years. I'll schedule 1 in the next few months. -Her 10/29/16 DEXA shows osteopenia with a left femur neck T-Score of -1.7.  -We discussed potential osteopenia and osteoporosis from letrozole, we'll monitor her bone density scan every 2 years. -She will continue calcium and vitamin D supplements.  4. Genetic  Counseling -The patient's mother has a history of breast cancer at an early age and the patient has a personal history of cervical  cancer at the age of 45, status post surgical intervention. -She was seen by our genetic counselor, and degenerative testing was negative  5. Glaucoma -Managed by her PCP. She takes Travatan drops.  6. Anxiety -She use Ativan 0.10m at night and another 0.568mas needed  -Refilled Ativan today 911/16/18) -she will get her refills from her PCP for future   PLAN -Refill letrozole and Ativan today (her PCP will refill Ativan in the future) -Mammogram in 04/2017 -Reschedule her survivorship visit to 2 months from now -Lab and f/u in 5 months    Orders Placed This Encounter  Procedures  . MM DIAG BREAST TOMO BILATERAL    Standing Status:   Future    Standing Expiration Date:   01/04/2018    Order Specific Question:   Reason for Exam (SYMPTOM  OR DIAGNOSIS REQUIRED)    Answer:   screening    Order Specific Question:   Is the patient pregnant?    Answer:   No    Order Specific Question:   [facility]    [facility]  All questions were answered. The patient knows to call the clinic with any problems, questions or concerns. I spent 25 minutes counseling the patient face to face. The total time spent in the appointment was 30 minutes and more than 50% was on counseling.    This document serves as a record of services personally performed by YaTruitt MerleMD. It was created on her behalf by AmJoslyn Devona trained medical scribe. The creation of this record is based on the scribe's personal observations and the provider's statements to them.    I have reviewed the above documentation for accuracy and completeness, and I agree with the above.  FeTruitt Merle11/16/2018

## 2017-01-04 ENCOUNTER — Other Ambulatory Visit (HOSPITAL_BASED_OUTPATIENT_CLINIC_OR_DEPARTMENT_OTHER): Payer: BLUE CROSS/BLUE SHIELD

## 2017-01-04 ENCOUNTER — Encounter: Payer: BLUE CROSS/BLUE SHIELD | Admitting: Adult Health

## 2017-01-04 ENCOUNTER — Encounter: Payer: Self-pay | Admitting: Hematology

## 2017-01-04 ENCOUNTER — Ambulatory Visit (HOSPITAL_BASED_OUTPATIENT_CLINIC_OR_DEPARTMENT_OTHER): Payer: BLUE CROSS/BLUE SHIELD | Admitting: Hematology

## 2017-01-04 ENCOUNTER — Telehealth: Payer: Self-pay

## 2017-01-04 VITALS — BP 139/63 | HR 65 | Temp 98.5°F | Resp 17 | Ht 63.0 in | Wt 126.6 lb

## 2017-01-04 DIAGNOSIS — C50212 Malignant neoplasm of upper-inner quadrant of left female breast: Secondary | ICD-10-CM | POA: Diagnosis not present

## 2017-01-04 DIAGNOSIS — Z170 Estrogen receptor positive status [ER+]: Secondary | ICD-10-CM | POA: Diagnosis not present

## 2017-01-04 DIAGNOSIS — H409 Unspecified glaucoma: Secondary | ICD-10-CM | POA: Diagnosis not present

## 2017-01-04 DIAGNOSIS — F419 Anxiety disorder, unspecified: Secondary | ICD-10-CM | POA: Diagnosis not present

## 2017-01-04 DIAGNOSIS — Z79811 Long term (current) use of aromatase inhibitors: Secondary | ICD-10-CM | POA: Diagnosis not present

## 2017-01-04 DIAGNOSIS — M8580 Other specified disorders of bone density and structure, unspecified site: Secondary | ICD-10-CM

## 2017-01-04 LAB — CBC WITH DIFFERENTIAL/PLATELET
BASO%: 0.7 % (ref 0.0–2.0)
BASOS ABS: 0 10*3/uL (ref 0.0–0.1)
EOS ABS: 0.1 10*3/uL (ref 0.0–0.5)
EOS%: 1.5 % (ref 0.0–7.0)
HEMATOCRIT: 39.5 % (ref 34.8–46.6)
HEMOGLOBIN: 13.5 g/dL (ref 11.6–15.9)
LYMPH#: 1.3 10*3/uL (ref 0.9–3.3)
LYMPH%: 30.4 % (ref 14.0–49.7)
MCH: 34.9 pg — AB (ref 25.1–34.0)
MCHC: 34.2 g/dL (ref 31.5–36.0)
MCV: 102 fL — AB (ref 79.5–101.0)
MONO#: 0.5 10*3/uL (ref 0.1–0.9)
MONO%: 12.6 % (ref 0.0–14.0)
NEUT%: 54.8 % (ref 38.4–76.8)
NEUTROS ABS: 2.4 10*3/uL (ref 1.5–6.5)
Platelets: 166 10*3/uL (ref 145–400)
RBC: 3.87 10*6/uL (ref 3.70–5.45)
RDW: 12.7 % (ref 11.2–14.5)
WBC: 4.3 10*3/uL (ref 3.9–10.3)

## 2017-01-04 LAB — COMPREHENSIVE METABOLIC PANEL
ALBUMIN: 4.1 g/dL (ref 3.5–5.0)
ALT: 30 U/L (ref 0–55)
AST: 22 U/L (ref 5–34)
Alkaline Phosphatase: 87 U/L (ref 40–150)
Anion Gap: 8 mEq/L (ref 3–11)
BUN: 7.6 mg/dL (ref 7.0–26.0)
CALCIUM: 9.6 mg/dL (ref 8.4–10.4)
CO2: 26 mEq/L (ref 22–29)
Chloride: 102 mEq/L (ref 98–109)
Creatinine: 0.8 mg/dL (ref 0.6–1.1)
EGFR: >60 mL/min/{1.73_m2} (ref >60)
GLUCOSE: 85 mg/dL (ref 70–140)
Potassium: 3.6 mEq/L (ref 3.5–5.1)
SODIUM: 136 meq/L (ref 136–145)
TOTAL PROTEIN: 7.8 g/dL (ref 6.4–8.3)
Total Bilirubin: 0.4 mg/dL (ref 0.20–1.20)

## 2017-01-04 MED ORDER — LORAZEPAM 0.5 MG PO TABS: 0.2500 mg | ORAL_TABLET | Freq: Every evening | ORAL | 2 refills | Status: DC | PRN

## 2017-01-04 NOTE — Telephone Encounter (Signed)
Printed avs and calender for upcoming appointment. Per 11/16 los. Patient said she will schedule her own mammagram

## 2017-01-17 ENCOUNTER — Encounter: Payer: BLUE CROSS/BLUE SHIELD | Admitting: Adult Health

## 2017-01-23 ENCOUNTER — Other Ambulatory Visit: Payer: Self-pay | Admitting: Hematology

## 2017-01-23 ENCOUNTER — Telehealth: Payer: Self-pay | Admitting: *Deleted

## 2017-01-23 MED ORDER — MECLIZINE HCL 25 MG PO TABS: 25.0000 mg | ORAL_TABLET | Freq: Three times a day (TID) | ORAL | 0 refills | Status: DC | PRN

## 2017-01-23 NOTE — Telephone Encounter (Signed)
  Oncology Nurse Navigator Documentation  Navigator Location: [facility] (01/23/17 1400)   )Navigator Encounter Type: Telephone (01/23/17 1400) Telephone: [place] Call (01/23/17 1400)                           Interventions: Psycho-social support (01/23/17 1400)                      Time Spent with Patient: 30 (01/23/17 1400)

## 2017-02-20 ENCOUNTER — Telehealth: Payer: Self-pay

## 2017-02-20 NOTE — Telephone Encounter (Signed)
Left VM reminding patient of SCP visit on 03/01/17 at 2 pm. Encouraged pt to call center with questions or concerns.

## 2017-03-01 ENCOUNTER — Encounter: Payer: BLUE CROSS/BLUE SHIELD | Admitting: Adult Health

## 2017-03-14 ENCOUNTER — Ambulatory Visit
Admission: RE | Admit: 2017-03-14 | Discharge: 2017-03-14 | Disposition: A | Discharge status: Home / Self Care | Payer: BLUE CROSS/BLUE SHIELD | Source: Ambulatory Visit | Attending: General Surgery | Admitting: General Surgery

## 2017-03-14 ENCOUNTER — Other Ambulatory Visit: Payer: Self-pay | Admitting: General Surgery

## 2017-03-14 DIAGNOSIS — K5732 Diverticulitis of large intestine without perforation or abscess without bleeding: Secondary | ICD-10-CM

## 2017-03-14 MED ORDER — IOPAMIDOL (ISOVUE-300) INJECTION 61%
100.0000 mL | Freq: Once | INTRAVENOUS | Status: AC | PRN
  Administered 2017-03-14: 100 mL via INTRAVENOUS

## 2017-03-15 NOTE — Progress Notes (Signed)
Please let patient know that CT is negative for colon inflammation.

## 2017-03-20 ENCOUNTER — Other Ambulatory Visit: Payer: Self-pay | Admitting: *Deleted

## 2017-03-20 MED ORDER — LETROZOLE 2.5 MG PO TABS: 2.5000 mg | ORAL_TABLET | Freq: Every day | ORAL | 3 refills | Status: DC

## 2017-04-05 ENCOUNTER — Encounter: Payer: Self-pay | Admitting: Gastroenterology

## 2017-04-16 ENCOUNTER — Telehealth: Payer: Self-pay | Admitting: Hematology

## 2017-04-16 NOTE — Telephone Encounter (Signed)
Left message for patient to call back to scheduled SCP visit per 2/26 sch msg

## 2017-04-17 ENCOUNTER — Telehealth: Payer: Self-pay | Admitting: *Deleted

## 2017-04-17 NOTE — Telephone Encounter (Signed)
Faxed ROI to [facility] 04/17/17 @ 11:51pm; Attn: Margaretha Sheffield MD, Reference # [id number]

## 2017-04-18 ENCOUNTER — Other Ambulatory Visit: Payer: Self-pay | Admitting: Hematology

## 2017-04-18 ENCOUNTER — Other Ambulatory Visit: Payer: Self-pay | Admitting: General Surgery

## 2017-04-18 DIAGNOSIS — Z170 Estrogen receptor positive status [ER+]: Principal | ICD-10-CM

## 2017-04-18 DIAGNOSIS — C50212 Malignant neoplasm of upper-inner quadrant of left female breast: Secondary | ICD-10-CM

## 2017-04-19 ENCOUNTER — Telehealth: Payer: Self-pay | Admitting: Adult Health

## 2017-04-19 NOTE — Telephone Encounter (Signed)
Called regarding 3/11 left a message

## 2017-04-24 ENCOUNTER — Telehealth: Payer: Self-pay

## 2017-04-24 NOTE — Telephone Encounter (Signed)
Called and left message reminding patient of appt for 3/11 at 1000am.

## 2017-04-29 ENCOUNTER — Encounter: Payer: BLUE CROSS/BLUE SHIELD | Admitting: Adult Health

## 2017-05-13 ENCOUNTER — Ambulatory Visit
Admission: RE | Admit: 2017-05-13 | Discharge: 2017-05-13 | Disposition: A | Discharge status: Home / Self Care | Payer: BLUE CROSS/BLUE SHIELD | Source: Ambulatory Visit | Attending: Hematology | Admitting: Hematology

## 2017-05-13 DIAGNOSIS — C50212 Malignant neoplasm of upper-inner quadrant of left female breast: Secondary | ICD-10-CM

## 2017-05-13 DIAGNOSIS — Z170 Estrogen receptor positive status [ER+]: Principal | ICD-10-CM

## 2017-05-13 HISTORY — DX: Malignant neoplasm of unspecified site of unspecified female breast: C50.919

## 2017-05-13 HISTORY — DX: Personal history of irradiation: Z92.3

## 2017-05-28 ENCOUNTER — Ambulatory Visit: Payer: BLUE CROSS/BLUE SHIELD | Admitting: Gastroenterology

## 2017-05-31 ENCOUNTER — Telehealth: Payer: Self-pay | Admitting: Hematology

## 2017-05-31 NOTE — Telephone Encounter (Signed)
PAL - moved 4/23 appointments to 5/10. Spoke with patient who is not able to come 5/10 and moved lab/fu to 5/9. Patient has new date/time for 06/27/17 at 10:15 am.

## 2017-06-13 ENCOUNTER — Other Ambulatory Visit: Payer: BLUE CROSS/BLUE SHIELD

## 2017-06-13 ENCOUNTER — Ambulatory Visit: Payer: BLUE CROSS/BLUE SHIELD | Admitting: Hematology

## 2017-06-25 NOTE — Progress Notes (Signed)
[place]  Telephone:([phone] Fax:([phone]) [phone]  Clinic Follow Up Note   Patient Care Team: Hoyt Koch, MD as PCP - General (Obstetrics and Gynecology) [name], MD as Consulting Physician (Hematology) Stark Klein, MD as Consulting Physician (General Surgery) Kyung Rudd, MD as Consulting Physician (Radiation Oncology) Gardenia Phlegm, NP as Nurse Practitioner (Hematology and Oncology) 06/27/2017  CHIEF COMPLAINTS:  Left breast cancer  Oncology History   Cancer Staging Malignant neoplasm of upper-inner quadrant of left breast in female, estrogen receptor positive ([place]) Staging form: Breast, AJCC 8th Edition - Clinical stage from 05/22/2016: Stage IA (cT1a, cN0, cM0, G2, ER: Positive, PR: Positive, HER2: Negative) - Signed by [name], MD on 05/29/2016       Malignant neoplasm of upper-inner quadrant of left breast in female, estrogen receptor positive ([place])   05/10/2016 Mammogram    Bilateral screening mammogram on 05/10/16 showed a possible distortion with calcifications in the left breast.      05/16/2016 Mammogram    Diagnostic mammogram and US showed Persistent distortion in the upper inner quadrant of the left breast possibly correlating with the sonographic area of acoustic shadowing, measuring about 28m. [place]of left axilla (-)      05/22/2016 Receptors her2    ER 100%, PR 70%+, Ki67 5%      05/22/2016 Initial Biopsy    Diagnosis Breast, left, needle core biopsy, upper inner - INVASIVE AND IN SITU LOBULAR CARCINOMA, G1-2      05/22/2016 Initial Diagnosis    Malignant neoplasm of upper-inner quadrant of left breast in female, estrogen receptor positive ([place]      05/31/2016 Genetic Testing    Genetic counseling and testing for hereditary cancer syndromes performed on 05/31/2016. Results are negative for pathogenic mutations in 46 genes analyzed by [facility]'s Common Hereditary Cancers Panel. Results are dated 06/11/2016. Genes analyzed  include: APC, ATM, AXIN2, BARD1, BMPR1A, BRCA1, BRCA2, BRIP1, CDH1, CDKN2A, CHEK2, CTNNA1, DICER1, EPCAM, GREM1, HOXB13, KIT, MEN1, MLH1, MSH2, MSH3, MSH6, MUTYH, NBN, NF1, NTHL1, PALB2, PDGFRA, PMS2, POLD1, POLE, PTEN, RAD50, RAD51C, RAD51D, SDHA, SDHB, SDHC, SDHD, SMAD4, SMARCA4, STK11, TP53, TSC1, TSC2, and VHL.       07/01/2016 Oncotype testing    Oncotype testing showed reoccurance score of 11. With 10 year risk of distant reoccurrence 7% with tamoxifen alone      07/04/2016 Surgery    Left breast lumpectomy with radioactive seed x2 and sentinel lymph node biopsy by Dr. BBarry Dienes     08/08/2016 - 09/24/2016 Radiation Therapy    Patient underwent radiation with Dr.Moody      09/2016 -  Anti-estrogen oral therapy    Letrozole 2.5 mg daily starting 09/2016      05/13/2017 Mammogram    Bilateral diagnostic mammogram and left ultrasonography  IMPRESSION:  1. No suspicious mammographic or sonographic abnormalities in the areas of patient's LEFT breast pain.  2. No mammographic evidence of breast malignancy.        HISTORY OF PRESENTING ILLNESS (05/30/2016):  Sally Flurry57y.o. female is here because of a new diagnosis of left breast cancer.  Bilateral screening mammogram on 05/10/16 showed a possible distortion with calcifications in the left breast. Diagnostic left breast mammogram on 05/16/16 showed no suspicious microcalcifications in the left breast, but did confirm the presence of distortion in the upper inner quadrant of the left breast. Other areas of questioned distortion was not persistent on further evaluation. [place]of the left breast showed a vague area  of acoustic shadowing in the 11:30 position 3 cm from the nipple measuring about  0.6 cm and associated with adjacent vascularity on Doppler evaluation. The left axilla was negative for adenopathy.  Biopsy of the UIQ of the left breast on 05/22/16 revealed grade 1-2 revealed invasive lobular carcinoma and LCIS. ER 100%+, PR 70%+,  Ki67 5%  The patient presents today with her husband in multidisciplinary breast clinic to discuss treatment options for the management of her disease. The patient's breast density category is C: The breast tissue is heterogeneously dense, which may obscure small masses.  GYN HISTORY  Menarchal: 9 LMP: 48 Contraceptive: Post-menopause HRT: No GP: G3P2, one child miscarriage  Denies pain, discomfort, arthritis, changes in appetite or weight, HTN, diabetes, or hyperlipidemia. The patient reports cervical cancer at age 46. She had surgical intervention at [facility]. She's has 2 children since then. She had skin cancer of the right lower extremity about 10 years ago by Dr. Rhona Raider. She also had removal of basal cell carcinoma of the right face a few years ago. She has glaucoma. She reports having bad hot flashes when she entered menopause. She has hot flashes 1-2 times a month now.  CURRENT THERAPY: Letrozole 2.5 mg daily starting 09/2016    INTERIM HISTORY Sally Smith is here for a follow up. She presents to the clinic today accompanied by her husband. She notes that she is doing well overall. She is tolerating letrozole, with good tolerance overall. She has intermittent hot flashes that is gradually improving at this time and is manageable. She has bilateral hip aches, which is tolerable at this time.   Since her last visit to the office, she underwent a bilateral diagnostic mammogram and left ultrasonography on 05/13/2017 that showed: No suspicious mammographic or sonographic abnormalities in the areas of patient's LEFT breast pain. No mammographic evidence of breast malignancy.  She also had a CT AP with contrast on 03/14/17 that showed: Negative for colon wall thickening or bowel obstruction. Liquid stools in the colon consistent with diarrheal illness. Cholelithiasis. Punctate nonobstructing stone in the lower pole of the left kidney.  On review of systems, she reports hair loss/thinning  when she washes her hair, weight loss due to diarrhea contributed to 2 rounds of diverticulitis, bilateral hip aches, generalized joint aches, intermittent shootingpain to her left breast. She does complete self breast exams at home. She notes that she has to wear a camisole with a bra inside of it to aid with her breast pain. She voices that she is interested in CBD oil for her joint pain. She denies appetite change, back pain, and any other symptoms.     MEDICAL HISTORY:  Past Medical History:  Diagnosis Date  . Anxiety   . Breast cancer ([place]) 07/03/2016  . Cancer of left breast ([place])    [facility] genetic testing negative on 05/31/16  . Genetic testing 06/18/2016   Ms. Stave underwent genetic counseling and testing for hereditary cancer syndromes on 05/31/2016. Her results were negative for mutations in all 46 genes analyzed by [facility]'s 46-gene Common Hereditary Cancers Panel. Genes analyzed include: APC, ATM, AXIN2, BARD1, BMPR1A, BRCA1, BRCA2, BRIP1, CDH1, CDKN2A, CHEK2, CTNNA1, DICER1, EPCAM, GREM1, HOXB13, KIT, MEN1, MLH1, MSH2, MSH3, MSH6, MUTYH, NBN,  . Personal history of radiation therapy 2018   Left Breast Cancer  . Skin cancer    left breast     SURGICAL HISTORY: Past Surgical History:  Procedure Laterality Date  . BREAST LUMPECTOMY Left 07/03/2016  .  BREAST LUMPECTOMY WITH RADIOACTIVE SEED AND SENTINEL LYMPH NODE BIOPSY Left 07/04/2016   Procedure: LEFT BREAST LUMPECTOMY WITH RADIOACTIVE SEED X 2 AND SENTINEL LYMPH NODE BIOPSY;  Surgeon: Stark Klein, MD;  Location: [place];  Service: General;  Laterality: Left;  . surgery on right leg    . TONSILLECTOMY      SOCIAL HISTORY: Social History   Socioeconomic History  . Marital status: Married    Spouse name: Not on file  . Number of children: Not on file  . Years of education: Not on file  . Highest education level: Not on file  Occupational History  . Not on file  Social Needs  . Financial resource  strain: Not on file  . Food insecurity:    Worry: Not on file    Inability: Not on file  . Transportation needs:    Medical: Not on file    Non-medical: Not on file  Tobacco Use  . Smoking status: Never Smoker  . Smokeless tobacco: Never Used  Substance and Sexual Activity  . Alcohol use: Yes    Alcohol/week: 0.6 oz    Types: 1 Glasses of wine per week    Comment: Occassional  . Drug use: No  . Sexual activity: Yes    Birth control/protection: Surgical    Comment: husband had vasectomy  Lifestyle  . Physical activity:    Days per week: Not on file    Minutes per session: Not on file  . Stress: Not on file  Relationships  . Social connections:    Talks on phone: Not on file    Gets together: Not on file    Attends religious service: Not on file    Active member of club or organization: Not on file    Attends meetings of clubs or organizations: Not on file    Relationship status: Not on file  . Intimate partner violence:    Fear of current or ex partner: Not on file    Emotionally abused: Not on file    Physically abused: Not on file    Forced sexual activity: Not on file  Other Topics Concern  . Not on file  Social History Narrative  . Not on file    FAMILY HISTORY: Family History  Problem Relation Age of Onset  . Breast cancer Mother 46       Contralateral breast cancer a few years later that metastasized.    ALLERGIES:  is allergic to bee venom; latex; and sulfur.  MEDICATIONS:  Current Outpatient Medications  Medication Sig Dispense Refill  . acetaminophen (TYLENOL) 500 MG tablet Take 1,000 mg by mouth every 6 (six) hours as needed for mild pain, moderate pain, fever or headache.    . Calcium Carb-Cholecalciferol (CALCIUM-VITAMIN D) 500-200 MG-UNIT tablet Take 1 tablet by mouth daily.     . cholecalciferol (VITAMIN D) 1000 units tablet Take 1,000 Units by mouth daily.    Marland Kitchen docusate sodium (COLACE) 100 MG capsule Take 100 mg by mouth 2 (two) times a week.      Marland Kitchen EPINEPHrine 0.3 mg/0.3 mL IJ SOAJ injection Inject 0.3 mg into the muscle as needed (for allergic reaction).     Marland Kitchen HYDROcodone-acetaminophen (NORCO/VICODIN) 5-325 MG tablet Take 2 tablets by mouth every 4 (four) hours as needed. 15 tablet 0  . ibuprofen (ADVIL,MOTRIN) 200 MG tablet Take 400 mg by mouth every 6 (six) hours as needed for fever, headache, mild pain, moderate pain or cramping.    Marland Kitchen  letrozole (FEMARA) 2.5 MG tablet Take 1 tablet (2.5 mg total) by mouth daily. 90 tablet 3  . LORazepam (ATIVAN) 0.5 MG tablet Take 0.5-1 tablets (0.25-0.5 mg total) at bedtime as needed by mouth for anxiety. 1 tablet po 30 minutes prior to radiation and or qHS prn anxiety 30 tablet 2  . meclizine (DRAMAMINE II) 25 MG tablet Take 1 tablet (25 mg total) by mouth 3 (three) times daily as needed for dizziness. 20 tablet 0  . ondansetron (ZOFRAN-ODT) 4 MG disintegrating tablet Take 1 tablet (4 mg total) by mouth every 8 (eight) hours as needed for nausea. 10 tablet 0  . traMADol (ULTRAM) 50 MG tablet Take 1 tablet (50 mg total) by mouth every 6 (six) hours as needed. 45 tablet 0  . Travoprost, BAK Free, (TRAVATAN) 0.004 % SOLN ophthalmic solution Place 1 drop into both eyes at bedtime.     . tretinoin (RETIN-A) 0.025 % cream Apply small amount to face nightly    . VITAMIN E PO Take 1 tablet by mouth daily.    Marland Kitchen zolpidem (AMBIEN) 10 MG tablet Take 5 mg by mouth at bedtime as needed for sleep.    . potassium chloride (KLOR-CON) 20 MEQ packet Take 20 mEq by mouth 2 (two) times daily. 7 packet 0   No current facility-administered medications for this visit.     REVIEW OF SYSTEMS:   Constitutional: Denies fevers, chills or abnormal night sweats (+) mild hot flashes Eyes: Denies blurriness of vision, double vision or watery eyes Ears, nose, mouth, throat, and face: Denies mucositis or sore throat Respiratory: Denies cough, dyspnea or wheezes Cardiovascular: Denies palpitation, chest discomfort or lower  extremity swelling Gastrointestinal:  Denies nausea, heartburn or change in bowel habits Skin: Denies abnormal skin rashes  Lymphatics: Denies new lymphadenopathy or easy bruising MSK: (+) joint stiffness Neurological:Denies numbness, tingling or new weaknesses Behavioral/Psych:(+)anxiety All other systems were reviewed with the patient and are negative.  PHYSICAL EXAMINATION:  ECOG PERFORMANCE STATUS: 0 - Asymptomatic  Vitals:   06/27/17 1050  BP: (!) 124/57  Pulse: 61  Resp: 18  SpO2: 100%   Filed Weights   06/27/17 1050  Weight: 113 lb 8 oz (51.5 kg)    GENERAL:alert, no distress and comfortable SKIN: skin color, texture, turgor are normal, no rashes or significant lesions EYES: normal, conjunctiva are pink and non-injected, sclera clear OROPHARYNX:no exudate, no erythema and lips, buccal mucosa, and tongue normal  NECK: supple, thyroid normal size, non-tender, without nodularity LYMPH:  no palpable lymphadenopathy in the cervical, axillary or inguinal LUNGS: clear to auscultation and percussion with normal breathing effort CHEST: Protruding of the 3rd rib in the conjunction with the sternum that is tender.  HEART: regular rate & rhythm and no murmurs and no lower extremity edema ABDOMEN:abdomen soft, non-tender and normal bowel sounds Musculoskeletal:no cyanosis of digits and no clubbing  PSYCH: alert & oriented x 3 with fluent speech NEURO: no focal motor/sensory deficits Breast: Breast inspection showed them to be symmetrical with no nipple discharge.  Mild diffused skin around surgical incision around areola healed well, no scarred tissue. No masses I could appreciated. Tenderness in the entire left breast.    LABORATORY DATA:  I have reviewed the data as listed CBC Latest Ref Rng & Units 06/27/2017 01/04/2017 10/23/2016  WBC 3.9 - 10.3 K/uL 4.5 4.3 9.9  Hemoglobin 11.6 - 15.9 g/dL 13.4 13.5 15.4(H)  Hematocrit 34.8 - 46.6 % 38.7 39.5 41.8  Platelets 145 - 400 K/uL  144(L) 166 223   CMP Latest Ref Rng & Units 06/27/2017 01/04/2017 10/23/2016  Glucose 70 - 140 mg/dL 80 85 112(H)  BUN 7 - 26 mg/dL 7 7.6 8  Creatinine 0.60 - 1.10 mg/dL 0.73 0.8 0.63  Sodium 136 - 145 mmol/L 139 136 136  Potassium 3.5 - 5.1 mmol/L 3.2(L) 3.6 3.7  Chloride 98 - 109 mmol/L 103 - 100(L)  CO2 22 - 29 mmol/L 30(H) 26 23  Calcium 8.4 - 10.4 mg/dL 9.4 9.6 10.3  Total Protein 6.4 - 8.3 g/dL 7.7 7.8 8.9(H)  Total Bilirubin 0.2 - 1.2 mg/dL 0.4 0.40 1.1  Alkaline Phos 40 - 150 U/L 101 87 124  AST 5 - 34 U/L 20 22 33  ALT 0 - 55 U/L 21 30 54    PATHOLOGY   1   Diagnosis 07/04/2016 1. Breast, lumpectomy, Left - INVASIVE AND IN SITU LOBULAR CARCINOMA, 2.8 CM. - INVASIVE CARCINOMA FOCALLY INVOLVES POSTERIOR AND ANTERIOR MARGINS. - LOBULAR CARCINOMA IN SITU FOCALLY LESS THAN 0.1 CM FROM POSTERIOR MARGIN - PREVIOUS BIOPSY SITE. 2. Lymph node, sentinel, biopsy, Left Axillary #1 - ONE BENIGN LYMPH NODE (0/1). 3. Lymph node, sentinel, biopsy, Left Axillary #2 - ONE BENIGN LYMPH NODE (0/1). 4. Lymph node, sentinel, biopsy, Left - ONE BENIGN LYMPH NODE (0/1). 5. Lymph node, sentinel, biopsy, Left Axillary #3 - ONE BENIGN LYMPH NODE (0/1). Microscopic Comment 1. BREAST, INVASIVE TUMOR Procedure: Localized lumpectomy and four sentinel lymph nodes Laterality: Left breast Tumor Size: 2.8 cm Histologic Type: Lobular Grade: I Tubular Differentiation: 3 Nuclear Pleomorphism: 1 Mitotic Count: 1 Ductal Carcinoma in Situ (DCIS): No Extent of Tumor: Skin: N/A Nipple: N/A Skeletal muscle: N/A Margins: Anterior and posterior margins focally involved by invasive carcinoma. Invasive carcinoma, distance from closest margin: Focally 0 cm from anterior and posterior margin 1 of 3 FINAL for Sally Smith, Sally Smith ([id number]) Microscopic Comment(continued) DCIS, distance from closest margin: Focally less than 0.1 cm from posterior margin Regional Lymph Nodes: Number of Lymph Nodes  Examined: 4 Number of Sentinel Lymph Nodes Examined: 4 Lymph Nodes with Macrometastases: 0 Lymph Nodes with Micrometastases: 0 Lymph Nodes with Isolated Tumor Cells: 0 Breast Prognostic Profile: Case [id number] Estrogen Receptor: 100%, positive, strong staining Progesterone Receptor: 70%, positive, strong staining Her2: Negative, ratio 1.45 Ki-67: 5% Best tumor block for sendout testing: 1-k Pathologic Stage Classification (pTNM, AJCC 8th Edition): Primary Tumor (pT): pT2 Regional Lymph Nodes (pN): pN0 Distant Metastases (pM): pMX Comments: Immunohistochemistry for cytokeratin AE1/AE3 is performed on the sentinel lymph nodes (parts 2-5) and no positivity is identified. (JDP:kh 07/05/16.  Diagnosis Breast, left, needle core biopsy, upper inner - INVASIVE AND IN SITU MAMMARY CARCINOMA. - SEE COMMENT. Microscopic Comment The invasive carcinoma appears grade 1-2. An E-cadherin stain and a breast prognostic profile will be performed and the results reported separately. The results were called to [facility] on 05/23/16. (JBK:gt, 05/23/16) Enid Cutter MD Pathologist, Electronic Signature (Case signed 05/23/2016)  RADIOGRAPHIC STUDIES: I have personally reviewed the radiological images as listed and agreed with the findings in the report. No results found.   Bilateral diagnostic mammogram and left ultrasonography, 05/13/2017  1. No suspicious mammographic or sonographic abnormalities in the areas of patient's LEFT breast pain.  2. No mammographic evidence of breast malignancy.  Bone Density 10/29/16 ASSESSMENT: The BMD measured at Femur Neck Left is 0.807 g/cm2 with a T-score of -1.7. This patient is considered OSTEOPENIC according to [facility]) criteria. L3 was excluded due  to degenerative changes. Patient is not a candidate for FRAX assessment. Site Region Measured Date Measured Age YA T-score BMD Significant CHANGE DualFemur Neck Left   10/29/2016    57.6         -1.5    0.807 g/cm2 AP Spine  L1-L4 (L3) 10/29/2016    57.6         -0.9    1.077 g/cm2   ASSESSMENT & PLAN: 57 y.o. Caucasian female with screening detected left breast cancer.  1. 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 -the Oncotype Dx result was reviewed with her in details. She has low risk based on the recurrence score, which predicts 10 year distant recurrence after 5 years of tamoxifen 7%. She does not need adjuvant chemotherapy based on the low recurrence score. -She now has completed adjuvant breast radiation, tolerated well overall. -Giving the strong ER and PR expression in her postmenopausal status, I recommend adjuvant endocrine therapy with aromatase inhibitor for a total of 5-10 years to reduce the risk of cancer recurrence. Given her T2 lesion, and lobular histology, I recommend 7 years therapy if she tolerates well. The side effects were discussed in detail. She agreed.  -We previously discussed the breast cancer surveillance after her surgery. She will continue annual screening mammogram, self exam, and a routine office visit with lab and exam with [place]. -She started letrozole in 09/2016 and tolerating well with mild joint stiffness and hot flashes. I encouraged her to be active and massages are fine. If worsens she can take tylenol.  -Bilateral diagnostic mammogram and left ultrasonography on 05/13/2017 showed: No suspicious mammographic or sonographic abnormalities in the areas of patient's LEFT breast pain. No mammographic evidence of breast malignancy. -Labs reviewed, CBC WNL, I will notify her when her CMP return -Will continue with breast surveillance, recent mammogram on 05/13/2017 that showed: No mammographic evidence of breast  malignancy. -Will refill letrozole today (3 months plus refills).  -Lab and f/u in 4 months    2. Left breast pain and tenderness  -secondary to breast surgery and radiation -much improved now, she is off narcotics  --Will refill patient tramadol prescription to aid with breast pain, she only use once a while  -Advised that the patient could try acupuncture completed to aid with left chest wall pain.   3. Osteopenia  -She has not had a bone density scan for many years. I'll schedule 1 in the next few months. -Her 10/29/16 DEXA shows osteopenia with a left femur neck T-Score of -1.7.  -We previously discussed potential osteopenia and osteoporosis from letrozole, we'll monitor her bone density scan every 2 years. -She will continue calcium and vitamin D supplements.  4. Genetic Counseling -The patient's mother has a history of breast cancer at an early age and the patient has a personal history of cervical cancer at the age of 47, status post surgical intervention. -She was seen by our genetic counselor, and genetic test was negative  5. Glaucoma -Managed by her PCP. She takes Travatan drops.  6. Anxiety -She use Ativan 0.52m at night and another 0.597mas needed  -Refilled Ativan today 911/16/18) -she will get her refills from her PCP for future   7. Joint pain -Patient voices that she has had generalized joint pain since taking the letrozole and  she would like to know medication options to aid with this.  -I advised the patient to alternate tylenol and ibuprofen for joint pain PRN -I encouraged the patient to start exercising to aid with joint pain. Patient notes that she just completed a 5K.   8. Hypokalemia  -Possible related to her diverticulitis.  Patient notes that she does eat bananas to aid with her low potassium levels.  -I advised the patient on various foods that she can consume to aid in increasing her potassium levels -I will prescribe the patient a prescription of 7  potassium tablets  PLAN -Refill letrozole (3 months plus refills) and tramadol today -Lab and f/u in 4 months     No orders of the defined types were placed in this encounter.   All questions were answered. The patient knows to call the clinic with any problems, questions or concerns. I spent 20 minutes counseling the patient face to face. The total time spent in the appointment was 25 minutes and more than 50% was on counseling.    This document serves as a record of services personally performed by [name], MD. It was created on her behalf by Steva Colder, a trained medical scribe. The creation of this record is based on the scribe's personal observations and the provider's statements to them.   I have reviewed the above documentation for accuracy and completeness, and I agree with the above.    [name]  06/27/2017

## 2017-06-27 ENCOUNTER — Inpatient Hospital Stay (HOSPITAL_BASED_OUTPATIENT_CLINIC_OR_DEPARTMENT_OTHER): Payer: BLUE CROSS/BLUE SHIELD | Admitting: Hematology

## 2017-06-27 ENCOUNTER — Telehealth: Payer: Self-pay

## 2017-06-27 ENCOUNTER — Inpatient Hospital Stay: Payer: BLUE CROSS/BLUE SHIELD | Attending: Hematology

## 2017-06-27 ENCOUNTER — Encounter: Payer: Self-pay | Admitting: Hematology

## 2017-06-27 VITALS — BP 124/57 | HR 61 | Resp 18 | Ht 63.0 in | Wt 113.5 lb

## 2017-06-27 DIAGNOSIS — M8580 Other specified disorders of bone density and structure, unspecified site: Secondary | ICD-10-CM | POA: Diagnosis not present

## 2017-06-27 DIAGNOSIS — N951 Menopausal and female climacteric states: Secondary | ICD-10-CM

## 2017-06-27 DIAGNOSIS — F419 Anxiety disorder, unspecified: Secondary | ICD-10-CM | POA: Diagnosis not present

## 2017-06-27 DIAGNOSIS — H409 Unspecified glaucoma: Secondary | ICD-10-CM | POA: Diagnosis not present

## 2017-06-27 DIAGNOSIS — E876 Hypokalemia: Secondary | ICD-10-CM

## 2017-06-27 DIAGNOSIS — C50212 Malignant neoplasm of upper-inner quadrant of left female breast: Secondary | ICD-10-CM | POA: Diagnosis not present

## 2017-06-27 DIAGNOSIS — Z170 Estrogen receptor positive status [ER+]: Secondary | ICD-10-CM | POA: Diagnosis not present

## 2017-06-27 DIAGNOSIS — N644 Mastodynia: Secondary | ICD-10-CM | POA: Insufficient documentation

## 2017-06-27 DIAGNOSIS — Z79811 Long term (current) use of aromatase inhibitors: Secondary | ICD-10-CM | POA: Diagnosis not present

## 2017-06-27 LAB — CBC WITH DIFFERENTIAL/PLATELET
BASOS ABS: 0 10*3/uL (ref 0.0–0.1)
Basophils Relative: 0 %
Eosinophils Absolute: 0.1 10*3/uL (ref 0.0–0.5)
Eosinophils Relative: 1 %
HEMATOCRIT: 38.7 % (ref 34.8–46.6)
Hemoglobin: 13.4 g/dL (ref 11.6–15.9)
LYMPHS ABS: 1.5 10*3/uL (ref 0.9–3.3)
LYMPHS PCT: 34 %
MCH: 35 pg — ABNORMAL HIGH (ref 25.1–34.0)
MCHC: 34.6 g/dL (ref 31.5–36.0)
MCV: 101 fL (ref 79.5–101.0)
MONO ABS: 0.5 10*3/uL (ref 0.1–0.9)
Monocytes Relative: 10 %
NEUTROS ABS: 2.5 10*3/uL (ref 1.5–6.5)
Neutrophils Relative %: 55 %
Platelets: 144 10*3/uL — ABNORMAL LOW (ref 145–400)
RBC: 3.83 MIL/uL (ref 3.70–5.45)
RDW: 12.3 % (ref 11.2–14.5)
WBC: 4.5 10*3/uL (ref 3.9–10.3)

## 2017-06-27 LAB — COMPREHENSIVE METABOLIC PANEL
ALT: 21 U/L (ref 0–55)
AST: 20 U/L (ref 5–34)
Albumin: 4.2 g/dL (ref 3.5–5.0)
Alkaline Phosphatase: 101 U/L (ref 40–150)
Anion gap: 6 (ref 3–11)
BILIRUBIN TOTAL: 0.4 mg/dL (ref 0.2–1.2)
BUN: 7 mg/dL (ref 7–26)
CO2: 30 mmol/L — ABNORMAL HIGH (ref 22–29)
CREATININE: 0.73 mg/dL (ref 0.60–1.10)
Calcium: 9.4 mg/dL (ref 8.4–10.4)
Chloride: 103 mmol/L (ref 98–109)
GFR calc Af Amer: >60 mL/min (ref >60)
Glucose, Bld: 80 mg/dL (ref 70–140)
POTASSIUM: 3.2 mmol/L — AB (ref 3.5–5.1)
Sodium: 139 mmol/L (ref 136–145)
TOTAL PROTEIN: 7.7 g/dL (ref 6.4–8.3)

## 2017-06-27 MED ORDER — LETROZOLE 2.5 MG PO TABS: 2.5000 mg | ORAL_TABLET | Freq: Every day | ORAL | 3 refills | Status: DC

## 2017-06-27 MED ORDER — POTASSIUM CHLORIDE 20 MEQ PO PACK: 20.0000 meq | PACK | Freq: Two times a day (BID) | ORAL | 0 refills | Status: DC

## 2017-06-27 MED ORDER — TRAMADOL HCL 50 MG PO TABS: 50.0000 mg | ORAL_TABLET | Freq: Four times a day (QID) | ORAL | 0 refills | Status: DC | PRN

## 2017-06-27 NOTE — Telephone Encounter (Signed)
Printed avs and calender of upcoming appointment. Per 5/9 los 

## 2017-06-28 ENCOUNTER — Other Ambulatory Visit: Payer: BLUE CROSS/BLUE SHIELD

## 2017-06-28 ENCOUNTER — Ambulatory Visit: Payer: BLUE CROSS/BLUE SHIELD | Admitting: Hematology

## 2017-07-02 ENCOUNTER — Other Ambulatory Visit: Payer: Self-pay

## 2017-07-02 ENCOUNTER — Telehealth: Payer: Self-pay

## 2017-07-02 DIAGNOSIS — C50212 Malignant neoplasm of upper-inner quadrant of left female breast: Secondary | ICD-10-CM

## 2017-07-02 DIAGNOSIS — Z170 Estrogen receptor positive status [ER+]: Principal | ICD-10-CM

## 2017-07-02 MED ORDER — ONDANSETRON 4 MG PO TBDP: 4.0000 mg | ORAL_TABLET | Freq: Three times a day (TID) | ORAL | 0 refills | Status: DC | PRN

## 2017-07-02 NOTE — Telephone Encounter (Signed)
Patient calls taking oral potassium chloride (powder form) has not been tolerating well.  Can only take half a packet a day because it gave her nausea and loose stools.  Patient wants to know if that is alright to take as above.  Also she does not have anymore Zofran if Dr. Burr Medico thinks this would be helpful.  Next lab test is 9/5.  Is that okay to wait that long to be retested?

## 2017-07-02 NOTE — Telephone Encounter (Addendum)
Error

## 2017-07-02 NOTE — Telephone Encounter (Signed)
Called patient left voice message on home phone, per Dr. Burr Medico okay to take half pack of KCL, needs to eat foods rich in Vit K and we will do a repeat lab in 2 to 3 weeks.

## 2017-07-02 NOTE — Telephone Encounter (Signed)
Yes, OK to take half pack KCL, ok to refill zofran. Encourage her to take K rich diet. Please set up lab BMP in 2-3 weeks if she agrees. Thanks.   [name] MD

## 2017-07-03 ENCOUNTER — Other Ambulatory Visit: Payer: Self-pay

## 2017-07-03 ENCOUNTER — Telehealth: Payer: Self-pay | Admitting: Hematology

## 2017-07-03 DIAGNOSIS — C50212 Malignant neoplasm of upper-inner quadrant of left female breast: Secondary | ICD-10-CM

## 2017-07-03 DIAGNOSIS — Z170 Estrogen receptor positive status [ER+]: Principal | ICD-10-CM

## 2017-07-03 MED ORDER — ONDANSETRON 4 MG PO TBDP: 4.0000 mg | ORAL_TABLET | Freq: Three times a day (TID) | ORAL | 0 refills | Status: DC | PRN

## 2017-07-03 NOTE — Telephone Encounter (Signed)
Appointment scheduled patient notified per 5/14 sch msg

## 2017-07-16 ENCOUNTER — Ambulatory Visit: Payer: BLUE CROSS/BLUE SHIELD | Admitting: Gastroenterology

## 2017-07-17 ENCOUNTER — Other Ambulatory Visit: Payer: BLUE CROSS/BLUE SHIELD

## 2017-07-18 ENCOUNTER — Telehealth: Payer: Self-pay

## 2017-07-18 ENCOUNTER — Inpatient Hospital Stay: Payer: BLUE CROSS/BLUE SHIELD

## 2017-07-18 DIAGNOSIS — C50212 Malignant neoplasm of upper-inner quadrant of left female breast: Secondary | ICD-10-CM | POA: Diagnosis not present

## 2017-07-18 DIAGNOSIS — Z170 Estrogen receptor positive status [ER+]: Principal | ICD-10-CM

## 2017-07-18 LAB — CBC WITH DIFFERENTIAL/PLATELET
BASOS ABS: 0 10*3/uL (ref 0.0–0.1)
BASOS PCT: 0 %
EOS ABS: 0.1 10*3/uL (ref 0.0–0.5)
Eosinophils Relative: 1 %
HCT: 38.4 % (ref 34.8–46.6)
HEMOGLOBIN: 13 g/dL (ref 11.6–15.9)
LYMPHS ABS: 1.4 10*3/uL (ref 0.9–3.3)
Lymphocytes Relative: 34 %
MCH: 34.9 pg — ABNORMAL HIGH (ref 25.1–34.0)
MCHC: 33.9 g/dL (ref 31.5–36.0)
MCV: 102.9 fL — ABNORMAL HIGH (ref 79.5–101.0)
Monocytes Absolute: 0.4 10*3/uL (ref 0.1–0.9)
Monocytes Relative: 9 %
NEUTROS PCT: 56 %
Neutro Abs: 2.3 10*3/uL (ref 1.5–6.5)
PLATELETS: 136 10*3/uL — AB (ref 145–400)
RBC: 3.73 MIL/uL (ref 3.70–5.45)
RDW: 12.5 % (ref 11.2–14.5)
WBC: 4.1 10*3/uL (ref 3.9–10.3)

## 2017-07-18 LAB — COMPREHENSIVE METABOLIC PANEL
ALT: 29 U/L (ref 0–55)
AST: 28 U/L (ref 5–34)
Albumin: 4.1 g/dL (ref 3.5–5.0)
Alkaline Phosphatase: 78 U/L (ref 40–150)
Anion gap: 7 (ref 3–11)
BILIRUBIN TOTAL: 0.4 mg/dL (ref 0.2–1.2)
BUN: 7 mg/dL (ref 7–26)
CALCIUM: 9.3 mg/dL (ref 8.4–10.4)
CHLORIDE: 104 mmol/L (ref 98–109)
CO2: 28 mmol/L (ref 22–29)
CREATININE: 0.7 mg/dL (ref 0.60–1.10)
Glucose, Bld: 94 mg/dL (ref 70–140)
Potassium: 3.7 mmol/L (ref 3.5–5.1)
Sodium: 139 mmol/L (ref 136–145)
TOTAL PROTEIN: 7.4 g/dL (ref 6.4–8.3)

## 2017-07-18 NOTE — Telephone Encounter (Signed)
Called patient per Dr. Burr Medico notified lab results potassium are normal, no other concerns.

## 2017-07-18 NOTE — Telephone Encounter (Signed)
I have reviewed her lab results and asked Malachy Mood to call her back.  Hypokalemia has resolved, mild thrombocytopenia is stable, no other concerns.  [name] MD

## 2017-07-18 NOTE — Telephone Encounter (Signed)
Patient called requesting her lab results from today.  Her [phone]

## 2017-08-27 ENCOUNTER — Telehealth: Payer: Self-pay

## 2017-08-27 NOTE — Telephone Encounter (Signed)
I called patient back, and left a message for her.  I encouraged her to call me back, or call scheduler to schedule an appointment to see me.  [name] MD

## 2017-08-27 NOTE — Telephone Encounter (Signed)
Patient called concerning her incision area. Tried transferrinig call to Triage RN but no answer . 2nd options was to transfer her to [facility]) accepted the call. Per 7/9 los

## 2017-08-27 NOTE — Telephone Encounter (Signed)
Patient calls with some concerns. She had left several messages over the past couple of weeks for [place] but has not received a return call.  She has two small hard places that have come up on left breast about 2 inches above the nipple. They are small, slightly itchy and firm.  Also she was using a gel on the breast which was very soothing but she is out of it. This was prescribed by the surgeon.  She would like a call back.  Her cell: [phone]

## 2017-08-30 ENCOUNTER — Telehealth: Payer: Self-pay | Admitting: Hematology

## 2017-08-30 NOTE — Telephone Encounter (Signed)
I called pt back and spoke with her. She will continue watch the small lumps in her left breast and call Dr. Barry Dienes or my office for appointment if needed in a few weeks.   Sally Smith  08/30/2017

## 2017-09-04 ENCOUNTER — Telehealth: Payer: Self-pay | Admitting: *Deleted

## 2017-09-04 NOTE — Telephone Encounter (Signed)
Called the patient and left a voicemail to give her the information regarding radiaplex gel.  I informed her that we don't give more radiaplex after treatment finishes.  I let her know that I had spoken with PA Dara Lords and gave her the information form our discussion. Radiaplex is not as beneficial now that radiation is complete, she can use some vitamin E oil or coconut oil.   I left a call back number where I could be reached in the event she has any further questions.  Will continue to follow as necessary.  Sally Smith. Leonie Green, BSN

## 2017-09-05 ENCOUNTER — Other Ambulatory Visit: Payer: Self-pay | Admitting: Radiation Oncology

## 2017-09-05 MED ORDER — RADIAPLEXRX EX GEL: 1.0000 "application " | Freq: Once | CUTANEOUS | 2 refills | Status: DC | PRN

## 2017-09-13 ENCOUNTER — Ambulatory Visit: Payer: BLUE CROSS/BLUE SHIELD | Admitting: Gastroenterology

## 2017-09-13 ENCOUNTER — Encounter

## 2017-10-03 ENCOUNTER — Other Ambulatory Visit: Payer: Self-pay | Admitting: General Surgery

## 2017-10-03 DIAGNOSIS — N6320 Unspecified lump in the left breast, unspecified quadrant: Secondary | ICD-10-CM

## 2017-10-14 ENCOUNTER — Ambulatory Visit
Admission: RE | Admit: 2017-10-14 | Discharge: 2017-10-14 | Disposition: A | Discharge status: Home / Self Care | Payer: BLUE CROSS/BLUE SHIELD | Source: Ambulatory Visit | Attending: General Surgery | Admitting: General Surgery

## 2017-10-14 DIAGNOSIS — N6320 Unspecified lump in the left breast, unspecified quadrant: Secondary | ICD-10-CM

## 2017-10-14 MED ORDER — GADOBENATE DIMEGLUMINE 529 MG/ML IV SOLN
10.0000 mL | Freq: Once | INTRAVENOUS | Status: AC | PRN
  Administered 2017-10-14: 10 mL via INTRAVENOUS

## 2017-10-23 NOTE — Progress Notes (Signed)
[place]  [phone] [phone]  Clinic Follow Up Note   Patient Care Team: Hoyt Koch, MD as PCP - General (Obstetrics and Gynecology) [name], MD as Consulting Physician (Hematology) Stark Klein, MD as Consulting Physician (General Surgery) Kyung Rudd, MD as Consulting Physician (Radiation Oncology) Gardenia Phlegm, NP as Nurse Practitioner (Hematology and Oncology) 10/24/2017  CHIEF COMPLAINTS:  Left breast cancer  Oncology History   Cancer Staging Malignant neoplasm of upper-inner quadrant of left breast in female, estrogen receptor positive ([place]) Staging form: Breast, AJCC 8th Edition - Clinical stage from 05/22/2016: Stage IA (cT1a, cN0, cM0, G2, ER: Positive, PR: Positive, HER2: Negative) - Signed by [name], MD on 05/29/2016       Malignant neoplasm of upper-inner quadrant of left breast in female, estrogen receptor positive ([place])   05/10/2016 Mammogram    Bilateral screening mammogram on 05/10/16 showed a possible distortion with calcifications in the left breast.    05/16/2016 Mammogram    Diagnostic mammogram and US showed Persistent distortion in the upper inner quadrant of the left breast possibly correlating with the sonographic area of acoustic shadowing, measuring about 53m. [place]of left axilla (-)    05/22/2016 Receptors her2    ER 100%, PR 70%+, Ki67 5%    05/22/2016 Initial Biopsy    Diagnosis Breast, left, needle core biopsy, upper inner - INVASIVE AND IN SITU LOBULAR CARCINOMA, G1-2    05/22/2016 Initial Diagnosis    Malignant neoplasm of upper-inner quadrant of left breast in female, estrogen receptor positive ([place]    05/31/2016 Genetic Testing    Genetic counseling and testing for hereditary cancer syndromes performed on 05/31/2016. Results are negative for pathogenic mutations in 46 genes analyzed by [facility]'s Common Hereditary Cancers Panel. Results are dated 06/11/2016. Genes analyzed include: APC, ATM,  AXIN2, BARD1, BMPR1A, BRCA1, BRCA2, BRIP1, CDH1, CDKN2A, CHEK2, CTNNA1, DICER1, EPCAM, GREM1, HOXB13, KIT, MEN1, MLH1, MSH2, MSH3, MSH6, MUTYH, NBN, NF1, NTHL1, PALB2, PDGFRA, PMS2, POLD1, POLE, PTEN, RAD50, RAD51C, RAD51D, SDHA, SDHB, SDHC, SDHD, SMAD4, SMARCA4, STK11, TP53, TSC1, TSC2, and VHL.     07/01/2016 Oncotype testing    Oncotype testing showed reoccurance score of 11. With 10 year risk of distant reoccurrence 7% with tamoxifen alone    07/04/2016 Surgery    Left breast lumpectomy with radioactive seed x2 and sentinel lymph node biopsy by Dr. BBarry Dienes   08/08/2016 - 09/24/2016 Radiation Therapy    Patient underwent radiation with Dr.Moody    09/2016 -  Anti-estrogen oral therapy    Letrozole 2.5 mg daily starting 09/2016    05/13/2017 Mammogram    Bilateral diagnostic mammogram and left ultrasonography  IMPRESSION:  1. No suspicious mammographic or sonographic abnormalities in the areas of patient's LEFT breast pain.  2. No mammographic evidence of breast malignancy.     10/14/2017 Imaging    10/14/2017 Breast MRI IMPRESSION: No MRI evidence of malignancy in either breast. Left lumpectomy changes. No adenopathy.     HISTORY OF PRESENTING ILLNESS (05/30/2016):  Sally Flurry89y.o. female is here because of a new diagnosis of left breast cancer.  Bilateral screening mammogram on 05/10/16 showed a possible distortion with calcifications in the left breast. Diagnostic left breast mammogram on 05/16/16 showed no suspicious microcalcifications in the left breast, but did confirm the presence of distortion in the upper inner quadrant of the left breast. Other areas of questioned distortion was not persistent on further evaluation. [place]of the left breast showed  a vague area of acoustic shadowing in the 11:30 position 3 cm from the nipple measuring about  0.6 cm and associated with adjacent vascularity on Doppler evaluation. The left axilla was negative for adenopathy.  Biopsy of the UIQ  of the left breast on 05/22/16 revealed grade 1-2 revealed invasive lobular carcinoma and LCIS. ER 100%+, PR 70%+, Ki67 5%  The patient presents today with her husband in multidisciplinary breast clinic to discuss treatment options for the management of her disease. The patient's breast density category is C: The breast tissue is heterogeneously dense, which may obscure small masses.  GYN HISTORY  Menarchal: 9 LMP: 48 Contraceptive: Post-menopause HRT: No GP: G3P2, one child miscarriage  Denies pain, discomfort, arthritis, changes in appetite or weight, HTN, diabetes, or hyperlipidemia. The patient reports cervical cancer at age 57. She had surgical intervention at [facility]. She's has 2 children since then. She had skin cancer of the right lower extremity about 10 years ago by Dr. Rhona Raider. She also had removal of basal cell carcinoma of the right face a few years ago. She has glaucoma. She reports having bad hot flashes when she entered menopause. She has hot flashes 1-2 times a month now.  CURRENT THERAPY: Letrozole 2.5 mg daily starting 09/2016    INTERIM HISTORY  Sally Smith is here for a follow up. She is here with her husband. She is doing well. She said that she had an injury to her sternum and ribs and had a tender bump at the site of injury. She said that she saw Dr. Marlowe Aschoff PA and an MRI was recommended.  She is taking Tramadol and Letrozole.     MEDICAL HISTORY:  Past Medical History:  Diagnosis Date  . Anxiety   . Breast cancer ([place]) 07/03/2016  . Cancer of left breast ([place])    [facility] genetic testing negative on 05/31/16  . Genetic testing 06/18/2016   Sally Smith underwent genetic counseling and testing for hereditary cancer syndromes on 05/31/2016. Her results were negative for mutations in all 46 genes analyzed by [facility]'s 46-gene Common Hereditary Cancers Panel. Genes analyzed include: APC, ATM, AXIN2, BARD1, BMPR1A, BRCA1, BRCA2, BRIP1, CDH1, CDKN2A, CHEK2, CTNNA1,  DICER1, EPCAM, GREM1, HOXB13, KIT, MEN1, MLH1, MSH2, MSH3, MSH6, MUTYH, NBN,  . Personal history of radiation therapy 2018   Left Breast Cancer  . Skin cancer    left breast     SURGICAL HISTORY: Past Surgical History:  Procedure Laterality Date  . BREAST LUMPECTOMY Left 07/03/2016  . BREAST LUMPECTOMY WITH RADIOACTIVE SEED AND SENTINEL LYMPH NODE BIOPSY Left 07/04/2016   Procedure: LEFT BREAST LUMPECTOMY WITH RADIOACTIVE SEED X 2 AND SENTINEL LYMPH NODE BIOPSY;  Surgeon: Stark Klein, MD;  Location: [facility];  Service: General;  Laterality: Left;  . surgery on right leg    . TONSILLECTOMY      SOCIAL HISTORY: Social History   Socioeconomic History  . Marital status: Married    Spouse name: Not on file  . Number of children: Not on file  . Years of education: Not on file  . Highest education level: Not on file  Occupational History  . Not on file  Social Needs  . Financial resource strain: Not on file  . Food insecurity:    Worry: Not on file    Inability: Not on file  . Transportation needs:    Medical: Not on file    Non-medical: Not on file  Tobacco Use  . Smoking status: Never  Smoker  . Smokeless tobacco: Never Used  Substance and Sexual Activity  . Alcohol use: Yes    Alcohol/week: 1.0 standard drinks    Types: 1 Glasses of wine per week    Comment: Occassional  . Drug use: No  . Sexual activity: Yes    Birth control/protection: Surgical    Comment: husband had vasectomy  Lifestyle  . Physical activity:    Days per week: Not on file    Minutes per session: Not on file  . Stress: Not on file  Relationships  . Social connections:    Talks on phone: Not on file    Gets together: Not on file    Attends religious service: Not on file    Active member of club or organization: Not on file    Attends meetings of clubs or organizations: Not on file    Relationship status: Not on file  . Intimate partner violence:    Fear of current or ex  partner: Not on file    Emotionally abused: Not on file    Physically abused: Not on file    Forced sexual activity: Not on file  Other Topics Concern  . Not on file  Social History Narrative  . Not on file    FAMILY HISTORY: Family History  Problem Relation Age of Onset  . Breast cancer Mother 52       Contralateral breast cancer a few years later that metastasized.    ALLERGIES:  is allergic to bee venom; latex; and sulfur.  MEDICATIONS:  Current Outpatient Medications  Medication Sig Dispense Refill  . acetaminophen (TYLENOL) 500 MG tablet Take 1,000 mg by mouth every 6 (six) hours as needed for mild pain, moderate pain, fever or headache.    . Calcium Carb-Cholecalciferol (CALCIUM-VITAMIN D) 500-200 MG-UNIT tablet Take 1 tablet by mouth daily.     . cholecalciferol (VITAMIN D) 1000 units tablet Take 1,000 Units by mouth daily.    Marland Kitchen docusate sodium (COLACE) 100 MG capsule Take 100 mg by mouth 2 (two) times a week.    Marland Kitchen EPINEPHrine 0.3 mg/0.3 mL IJ SOAJ injection Inject 0.3 mg into the muscle as needed (for allergic reaction).     . hyaluronate sodium (RADIAPLEXRX) GEL Apply 1 application topically once as needed for up to 1 dose. 170 g 2  . HYDROcodone-acetaminophen (NORCO/VICODIN) 5-325 MG tablet Take 2 tablets by mouth every 4 (four) hours as needed. 15 tablet 0  . ibuprofen (ADVIL,MOTRIN) 200 MG tablet Take 400 mg by mouth every 6 (six) hours as needed for fever, headache, mild pain, moderate pain or cramping.    Marland Kitchen letrozole (FEMARA) 2.5 MG tablet Take 1 tablet (2.5 mg total) by mouth daily. 90 tablet 3  . LORazepam (ATIVAN) 0.5 MG tablet Take 0.5-1 tablets (0.25-0.5 mg total) at bedtime as needed by mouth for anxiety. 1 tablet po 30 minutes prior to radiation and or qHS prn anxiety 30 tablet 2  . meclizine (DRAMAMINE II) 25 MG tablet Take 1 tablet (25 mg total) by mouth 3 (three) times daily as needed for dizziness. 20 tablet 0  . ondansetron (ZOFRAN-ODT) 4 MG disintegrating  tablet Take 1 tablet (4 mg total) by mouth every 8 (eight) hours as needed for nausea. 10 tablet 0  . potassium chloride (KLOR-CON) 20 MEQ packet Take 20 mEq by mouth 2 (two) times daily. 7 packet 0  . traMADol (ULTRAM) 50 MG tablet Take 1 tablet (50 mg total) by mouth every 6 (six)  hours as needed. 45 tablet 0  . Travoprost, BAK Free, (TRAVATAN) 0.004 % SOLN ophthalmic solution Place 1 drop into both eyes at bedtime.     . tretinoin (RETIN-A) 0.025 % cream Apply small amount to face nightly    . VITAMIN E PO Take 1 tablet by mouth daily.    Marland Kitchen zolpidem (AMBIEN) 10 MG tablet Take 5 mg by mouth at bedtime as needed for sleep.     No current facility-administered medications for this visit.     REVIEW OF SYSTEMS:   Constitutional: Denies fevers, chills or abnormal night sweats (+) hair loss Eyes: Denies blurriness of vision, double vision or watery eyes Ears, nose, mouth, throat, and face: Denies mucositis or sore throat Respiratory: Denies cough, dyspnea or wheezes Cardiovascular: Denies palpitation, chest discomfort or lower extremity swelling Gastrointestinal:  Denies nausea, heartburn or change in bowel habits Skin: Denies abnormal skin rashes  Lymphatics: Denies new lymphadenopathy or easy bruising MSK: No new joint pain or stiffness Breast: (+) painful bump at left sternal margin after injury  Neurological:Denies numbness, tingling or new weaknesses Behavioral/Psych:No new mood changes  All other systems were reviewed with the patient and are negative.  PHYSICAL EXAMINATION:  ECOG PERFORMANCE STATUS: 0 - Asymptomatic  Vitals:   10/24/17 1126  BP: 128/65  Pulse: (!) 54  Resp: 18  Temp: 97.8 F (36.6 C)  SpO2: 100%   Filed Weights   10/24/17 1126  Weight: 115 lb 11.2 oz (52.5 kg)    GENERAL:alert, no distress and comfortable SKIN: skin color, texture, turgor are normal, no rashes or significant lesions EYES: normal, conjunctiva are pink and non-injected, sclera  clear OROPHARYNX:no exudate, no erythema and lips, buccal mucosa, and tongue normal  NECK: supple, thyroid normal size, non-tender, without nodularity LYMPH:  no palpable lymphadenopathy in the cervical, axillary or inguinal LUNGS: clear to auscultation and percussion with normal breathing effort CHEST: Protruding of the 3rd rib in the conjunction with the sternum that is tender.  HEART: regular rate & rhythm and no murmurs and no lower extremity edema ABDOMEN:abdomen soft, non-tender and normal bowel sounds Musculoskeletal:no cyanosis of digits and no clubbing  PSYCH: alert & oriented x 3 with fluent speech NEURO: no focal motor/sensory deficits Breast: Breast inspection showed them to be symmetrical with no nipple discharge.  Surgical incision around areola healed well, no scarred tissue. No masses I could appreciated. (+) Tenderness at the left sternal boarder T4 level with a 2x2cm bump, no skin erythema.   LABORATORY DATA:  I have reviewed the data as listed CBC Latest Ref Rng & Units 10/24/2017 07/18/2017 06/27/2017  WBC 3.9 - 10.3 K/uL 5.1 4.1 4.5  Hemoglobin 11.6 - 15.9 g/dL 13.2 13.0 13.4  Hematocrit 34.8 - 46.6 % 39.1 38.4 38.7  Platelets 145 - 400 K/uL 162 136(L) 144(L)   CMP Latest Ref Rng & Units 10/24/2017 07/18/2017 06/27/2017  Glucose 70 - 99 mg/dL 99 94 80  BUN 6 - 20 mg/dL '7 7 7  '$ Creatinine 0.44 - 1.00 mg/dL 0.78 0.70 0.73  Sodium 135 - 145 mmol/L 140 139 139  Potassium 3.5 - 5.1 mmol/L 4.0 3.7 3.2(L)  Chloride 98 - 111 mmol/L 102 104 103  CO2 22 - 32 mmol/L 31 28 30(H)  Calcium 8.9 - 10.3 mg/dL 9.8 9.3 9.4  Total Protein 6.5 - 8.1 g/dL 7.9 7.4 7.7  Total Bilirubin 0.3 - 1.2 mg/dL 0.7 0.4 0.4  Alkaline Phos 38 - 126 U/L 98 78 101  AST 15 -  41 U/L '22 28 20  '$ ALT 0 - 44 U/L '25 29 21    '$ PATHOLOGY   1   Diagnosis 07/04/2016 1. Breast, lumpectomy, Left - INVASIVE AND IN SITU LOBULAR CARCINOMA, 2.8 CM. - INVASIVE CARCINOMA FOCALLY INVOLVES POSTERIOR AND ANTERIOR  MARGINS. - LOBULAR CARCINOMA IN SITU FOCALLY LESS THAN 0.1 CM FROM POSTERIOR MARGIN - PREVIOUS BIOPSY SITE. 2. Lymph node, sentinel, biopsy, Left Axillary #1 - ONE BENIGN LYMPH NODE (0/1). 3. Lymph node, sentinel, biopsy, Left Axillary #2 - ONE BENIGN LYMPH NODE (0/1). 4. Lymph node, sentinel, biopsy, Left - ONE BENIGN LYMPH NODE (0/1). 5. Lymph node, sentinel, biopsy, Left Axillary #3 - ONE BENIGN LYMPH NODE (0/1). Microscopic Comment 1. BREAST, INVASIVE TUMOR Procedure: Localized lumpectomy and four sentinel lymph nodes Laterality: Left breast Tumor Size: 2.8 cm Histologic Type: Lobular Grade: I Tubular Differentiation: 3 Nuclear Pleomorphism: 1 Mitotic Count: 1 Ductal Carcinoma in Situ (DCIS): No Extent of Tumor: Skin: N/A Nipple: N/A Skeletal muscle: N/A Margins: Anterior and posterior margins focally involved by invasive carcinoma. Invasive carcinoma, distance from closest margin: Focally 0 cm from anterior and posterior margin 1 of 3 FINAL for Sally, Smith ([id number]) Microscopic Comment(continued) DCIS, distance from closest margin: Focally less than 0.1 cm from posterior margin Regional Lymph Nodes: Number of Lymph Nodes Examined: 4 Number of Sentinel Lymph Nodes Examined: 4 Lymph Nodes with Macrometastases: 0 Lymph Nodes with Micrometastases: 0 Lymph Nodes with Isolated Tumor Cells: 0 Breast Prognostic Profile: Case [id number] Estrogen Receptor: 100%, positive, strong staining Progesterone Receptor: 70%, positive, strong staining Her2: Negative, ratio 1.45 Ki-67: 5% Best tumor block for sendout testing: 1-k Pathologic Stage Classification (pTNM, AJCC 8th Edition): Primary Tumor (pT): pT2 Regional Lymph Nodes (pN): pN0 Distant Metastases (pM): pMX Comments: Immunohistochemistry for cytokeratin AE1/AE3 is performed on the sentinel lymph nodes (parts 2-5) and no positivity is identified. (JDP:kh 07/05/16.  Diagnosis Breast, left, needle core biopsy,  upper inner - INVASIVE AND IN SITU MAMMARY CARCINOMA. - SEE COMMENT. Microscopic Comment The invasive carcinoma appears grade 1-2. An E-cadherin stain and a breast prognostic profile will be performed and the results reported separately. The results were called to [facility] on 05/23/16. (JBK:gt, 05/23/16)  RADIOGRAPHIC STUDIES: I have personally reviewed the radiological images as listed and agreed with the findings in the report.  10/14/2017 Breast MRI IMPRESSION: No MRI evidence of malignancy in either breast. Left lumpectomy changes. No adenopathy.  Bilateral diagnostic mammogram and left ultrasonography, 05/13/2017  1. No suspicious mammographic or sonographic abnormalities in the areas of patient's LEFT breast pain.  2. No mammographic evidence of breast malignancy.  Bone Density 10/29/16 ASSESSMENT: The BMD measured at Femur Neck Left is 0.807 g/cm2 with a T-score of -1.7. This patient is considered OSTEOPENIC according to [facility]) criteria. L3 was excluded due to degenerative changes. Patient is not a candidate for FRAX assessment. Site Region Measured Date Measured Age YA T-score BMD Significant CHANGE DualFemur Neck Left  10/29/2016    57.8         -1.5    0.807 g/cm2 AP Spine  L1-L4 (L3) 10/29/2016    57.8         -0.9    1.077 g/cm2   ASSESSMENT & PLAN:   57 y.o. Caucasian female with screening detected left breast cancer.  1. 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 -the Oncotype Dx result was reviewed with her in details. She has low risk based on the recurrence score, which predicts 10 year distant recurrence after 5 years of tamoxifen 7%. She does not need adjuvant chemotherapy based on the low recurrence  score. -She now has completed adjuvant breast radiation, tolerated well overall. -Giving the strong ER and PR expression in her postmenopausal status, I recommend adjuvant endocrine therapy with aromatase inhibitor for a total of 5-10 years to reduce the risk of cancer recurrence. Given her T2 lesion, and lobular histology, I recommend 7 years therapy if she tolerates well. The side effects were discussed in detail. She agreed.  -We previously discussed the breast cancer surveillance after her surgery. She will continue annual screening mammogram, self exam, and a routine office visit with lab and exam with [place]. -She started letrozole in 09/2016 and tolerating well with mild joint stiffness and hot flashes. I encouraged her to be active and massages are fine. If worsens she can take tylenol.  -Bilateral diagnostic mammogram and left ultrasonography on 05/13/2017 showed: No suspicious mammographic or sonographic abnormalities in the areas of patient's LEFT breast pain. No mammographic evidence of breast malignancy. -Due to her new onset left up front chest pain (above left breast), she underwent bilateral breast MRI, which was negative. -Labs reviewed, CBC WNL, I will notify her when her CMP return  -Will continue with breast surveillance, recent mammogram on 05/13/2017 that showed: No mammographic evidence of breast malignancy.  -She is tolerating treatment very well with minimal side effects, will continue letrozole. -Lab and f/u in 3 months    2. Left breast pain and upper chest wall tenderness  -Her left breast pain is likely secondary to breast surgery and radiation -much improved now, she is off narcotics  -She had an injury to her chest recently that resulted in a painful bump on her upper front chest. An MRI of breasts was done and was negative. -I will order CXR to r/u rib fracture  -I spoke with Dr. Jimmye Norman who reviewed her recent breast MRI again, he did not see acute rib fracture, there was  a small bulge of fat in this region, fat necrosis is not excluded.  No evidence of local recurrence or bone metastasis.  3. Osteopenia  -She has not had a bone density scan for many years. I'll schedule 1 in the next few months. -Her 10/29/16 DEXA shows osteopenia with a left femur neck T-Score of -1.7.  -We previously discussed potential osteopenia and osteoporosis from letrozole, we'll monitor her bone density scan every 2 years. -She will continue calcium and vitamin D supplements.  4. Genetic Counseling -The patient's mother has a history of breast cancer at an early age and the patient has a personal history of cervical cancer at the age of 5, status post surgical intervention. -She was seen by our genetic counselor, and genetic test was negative  5. Glaucoma -Managed by her PCP. She takes Travatan drops.  6. Anxiety -She use Ativan 0.'5mg'$  at night and another 0.'5mg'$  as needed  -she will get her refills from her PCP for future   7. Joint pain -Patient voices that she has had generalized joint pain since taking the letrozole and she would like to know medication options to aid with this.  -I advised the patient to alternate tylenol and ibuprofen for joint pain PRN -I encouraged the patient to start exercising to  aid with joint pain. Patient notes that she just completed a 5K.    PLAN -CXR to r/u rib fracture. I will call with results  -Lab and f/u in 3 months    Orders Placed This Encounter  Procedures  . DG Chest 2 View    Standing Status:   Future    Number of Occurrences:   1    Standing Expiration Date:   10/24/2018    Order Specific Question:   Reason for Exam (SYMPTOM  OR DIAGNOSIS REQUIRED)    Answer:   pain    Order Specific Question:   Is patient pregnant?    Answer:   No    Order Specific Question:   Preferred imaging location?    Answer:   [facility]    Order Specific Question:   Radiology Contrast Protocol - do NOT remove file path    Answer:    [email or web address]    All questions were answered. The patient knows to call the clinic with any problems, questions or concerns. I spent 20 minutes counseling the patient face to face. The total time spent in the appointment was 25 minutes and more than 50% was on counseling.    Dierdre Searles Dweik am acting as scribe for Dr. [name].  I have reviewed the above documentation for accuracy and completeness, and I agree with the above.    [name]  10/24/2017   Addendum Her chest x-ray was negative for rib fracture, I also have spoken with radiologist Dr. Jimmye Norman to review her recent breast MRI again.  I have called patient after her clinic visit and discussed the results with her. No further images study is needed at this point.  She appreciated my call.  [name]  10/24/2017

## 2017-10-24 ENCOUNTER — Ambulatory Visit (HOSPITAL_COMMUNITY)
Admission: RE | Admit: 2017-10-24 | Discharge: 2017-10-24 | Disposition: A | Discharge status: Home / Self Care | Payer: BLUE CROSS/BLUE SHIELD | Source: Ambulatory Visit | Attending: Hematology | Admitting: Hematology

## 2017-10-24 ENCOUNTER — Inpatient Hospital Stay: Payer: BLUE CROSS/BLUE SHIELD

## 2017-10-24 ENCOUNTER — Telehealth: Payer: Self-pay | Admitting: Hematology

## 2017-10-24 ENCOUNTER — Inpatient Hospital Stay: Payer: BLUE CROSS/BLUE SHIELD | Attending: Hematology | Admitting: Hematology

## 2017-10-24 ENCOUNTER — Encounter: Payer: Self-pay | Admitting: Hematology

## 2017-10-24 VITALS — BP 128/65 | HR 54 | Temp 97.8°F | Resp 18 | Ht 63.0 in | Wt 115.7 lb

## 2017-10-24 DIAGNOSIS — Z170 Estrogen receptor positive status [ER+]: Secondary | ICD-10-CM | POA: Insufficient documentation

## 2017-10-24 DIAGNOSIS — C50212 Malignant neoplasm of upper-inner quadrant of left female breast: Secondary | ICD-10-CM | POA: Insufficient documentation

## 2017-10-24 LAB — COMPREHENSIVE METABOLIC PANEL
ALT: 25 U/L (ref 0–44)
ANION GAP: 7 (ref 5–15)
AST: 22 U/L (ref 15–41)
Albumin: 4.3 g/dL (ref 3.5–5.0)
Alkaline Phosphatase: 98 U/L (ref 38–126)
BUN: 7 mg/dL (ref 6–20)
CHLORIDE: 102 mmol/L (ref 98–111)
CO2: 31 mmol/L (ref 22–32)
Calcium: 9.8 mg/dL (ref 8.9–10.3)
Creatinine, Ser: 0.78 mg/dL (ref 0.44–1.00)
GFR calc non Af Amer: >60 mL/min (ref >60)
Glucose, Bld: 99 mg/dL (ref 70–99)
Potassium: 4 mmol/L (ref 3.5–5.1)
SODIUM: 140 mmol/L (ref 135–145)
Total Bilirubin: 0.7 mg/dL (ref 0.3–1.2)
Total Protein: 7.9 g/dL (ref 6.5–8.1)

## 2017-10-24 LAB — CBC WITH DIFFERENTIAL/PLATELET
Basophils Absolute: 0 10*3/uL (ref 0.0–0.1)
Basophils Relative: 1 %
Eosinophils Absolute: 0.1 10*3/uL (ref 0.0–0.5)
Eosinophils Relative: 3 %
HEMATOCRIT: 39.1 % (ref 34.8–46.6)
HEMOGLOBIN: 13.2 g/dL (ref 11.6–15.9)
LYMPHS ABS: 1.6 10*3/uL (ref 0.9–3.3)
Lymphocytes Relative: 31 %
MCH: 34.7 pg — AB (ref 25.1–34.0)
MCHC: 33.8 g/dL (ref 31.5–36.0)
MCV: 102.9 fL — ABNORMAL HIGH (ref 79.5–101.0)
MONOS PCT: 11 %
Monocytes Absolute: 0.5 10*3/uL (ref 0.1–0.9)
NEUTROS ABS: 2.8 10*3/uL (ref 1.5–6.5)
NEUTROS PCT: 54 %
Platelets: 162 10*3/uL (ref 145–400)
RBC: 3.8 MIL/uL (ref 3.70–5.45)
RDW: 12.6 % (ref 11.2–14.5)
WBC: 5.1 10*3/uL (ref 3.9–10.3)

## 2017-10-24 NOTE — Telephone Encounter (Signed)
Scheduled appt per 9/5 los - gave patient AVS and calender per los.   

## 2017-11-11 ENCOUNTER — Ambulatory Visit: Payer: BLUE CROSS/BLUE SHIELD | Admitting: Gastroenterology

## 2017-11-11 ENCOUNTER — Encounter

## 2017-11-13 ENCOUNTER — Encounter: Payer: Self-pay | Admitting: *Deleted

## 2017-12-13 ENCOUNTER — Other Ambulatory Visit: Payer: Self-pay | Admitting: Hematology

## 2017-12-13 MED ORDER — TRAMADOL HCL 50 MG PO TABS: 50.0000 mg | ORAL_TABLET | Freq: Two times a day (BID) | ORAL | 0 refills | Status: DC | PRN

## 2018-01-24 ENCOUNTER — Inpatient Hospital Stay: Payer: BLUE CROSS/BLUE SHIELD | Attending: Hematology | Admitting: Hematology

## 2018-01-24 ENCOUNTER — Inpatient Hospital Stay: Payer: BLUE CROSS/BLUE SHIELD

## 2018-01-24 ENCOUNTER — Telehealth: Payer: Self-pay | Admitting: Hematology

## 2018-01-24 VITALS — BP 132/69 | HR 72 | Temp 97.9°F | Resp 18 | Ht 63.0 in | Wt 124.5 lb

## 2018-01-24 DIAGNOSIS — Z170 Estrogen receptor positive status [ER+]: Principal | ICD-10-CM

## 2018-01-24 DIAGNOSIS — F419 Anxiety disorder, unspecified: Secondary | ICD-10-CM

## 2018-01-24 DIAGNOSIS — C50212 Malignant neoplasm of upper-inner quadrant of left female breast: Secondary | ICD-10-CM | POA: Insufficient documentation

## 2018-01-24 DIAGNOSIS — M8580 Other specified disorders of bone density and structure, unspecified site: Secondary | ICD-10-CM | POA: Insufficient documentation

## 2018-01-24 DIAGNOSIS — H409 Unspecified glaucoma: Secondary | ICD-10-CM | POA: Insufficient documentation

## 2018-01-24 DIAGNOSIS — Z79811 Long term (current) use of aromatase inhibitors: Secondary | ICD-10-CM | POA: Diagnosis not present

## 2018-01-24 DIAGNOSIS — E876 Hypokalemia: Secondary | ICD-10-CM | POA: Diagnosis not present

## 2018-01-24 LAB — CBC WITH DIFFERENTIAL/PLATELET
Abs Immature Granulocytes: 0.01 10*3/uL (ref 0.00–0.07)
BASOS ABS: 0 10*3/uL (ref 0.0–0.1)
Basophils Relative: 0 %
EOS PCT: 2 %
Eosinophils Absolute: 0.1 10*3/uL (ref 0.0–0.5)
HCT: 40.3 % (ref 36.0–46.0)
HEMOGLOBIN: 13.7 g/dL (ref 12.0–15.0)
IMMATURE GRANULOCYTES: 0 %
LYMPHS ABS: 1.8 10*3/uL (ref 0.7–4.0)
LYMPHS PCT: 30 %
MCH: 34.8 pg — ABNORMAL HIGH (ref 26.0–34.0)
MCHC: 34 g/dL (ref 30.0–36.0)
MCV: 102.3 fL — ABNORMAL HIGH (ref 80.0–100.0)
Monocytes Absolute: 0.6 10*3/uL (ref 0.1–1.0)
Monocytes Relative: 10 %
NEUTROS ABS: 3.5 10*3/uL (ref 1.7–7.7)
NRBC: 0 % (ref 0.0–0.2)
Neutrophils Relative %: 58 %
Platelets: 193 10*3/uL (ref 150–400)
RBC: 3.94 MIL/uL (ref 3.87–5.11)
RDW: 11.9 % (ref 11.5–15.5)
WBC: 6.1 10*3/uL (ref 4.0–10.5)

## 2018-01-24 LAB — COMPREHENSIVE METABOLIC PANEL
ALT: 23 U/L (ref 0–44)
ANION GAP: 11 (ref 5–15)
AST: 21 U/L (ref 15–41)
Albumin: 4.4 g/dL (ref 3.5–5.0)
Alkaline Phosphatase: 106 U/L (ref 38–126)
BUN: 8 mg/dL (ref 6–20)
CHLORIDE: 99 mmol/L (ref 98–111)
CO2: 29 mmol/L (ref 22–32)
Calcium: 9.6 mg/dL (ref 8.9–10.3)
Creatinine, Ser: 0.76 mg/dL (ref 0.44–1.00)
Glucose, Bld: 92 mg/dL (ref 70–99)
Potassium: 3.5 mmol/L (ref 3.5–5.1)
SODIUM: 139 mmol/L (ref 135–145)
Total Bilirubin: 0.6 mg/dL (ref 0.3–1.2)
Total Protein: 8.4 g/dL — ABNORMAL HIGH (ref 6.5–8.1)

## 2018-01-24 MED ORDER — RADIAPLEXRX EX GEL: 1.0000 "application " | Freq: Once | CUTANEOUS | 0 refills | Status: AC

## 2018-01-24 MED ORDER — TRAMADOL HCL 50 MG PO TABS: 50.0000 mg | ORAL_TABLET | Freq: Two times a day (BID) | ORAL | 0 refills | Status: DC | PRN

## 2018-01-24 NOTE — Progress Notes (Signed)
Sally Smith   [phone] [phone]   Clinic Follow up Note   Patient Care Team: Sally Koch, MD as PCP - General (Obstetrics and Gynecology) Sally Merle, MD as Consulting Physician (Hematology) Sally Klein, MD as Consulting Physician (General Surgery) Sally Rudd, MD as Consulting Physician (Radiation Oncology) Sally Phlegm, NP as Nurse Practitioner (Hematology and Oncology)  Date of Service:  01/24/2018  CHIEF COMPLAINT: F/u of left breast cancer  SUMMARY OF ONCOLOGIC HISTORY: Oncology History   Cancer Staging Malignant neoplasm of upper-inner quadrant of left breast in female, estrogen receptor positive ([place]) Staging form: Breast, AJCC 8th Edition - Clinical stage from 05/22/2016: Stage IA (cT1a, cN0, cM0, G2, ER: Positive, PR: Positive, HER2: Negative) - Signed by Sally Merle, MD on 05/29/2016       Malignant neoplasm of upper-inner quadrant of left breast in female, estrogen receptor positive ([place])   05/10/2016 Mammogram    Bilateral screening mammogram on 05/10/16 showed a possible distortion with calcifications in the left breast.    05/16/2016 Mammogram    Diagnostic mammogram and US showed Persistent distortion in the upper inner quadrant of the left breast possibly correlating with the sonographic area of acoustic shadowing, measuring about 40m. [place]of left axilla (-)    05/22/2016 Receptors her2    ER 100%, PR 70%+, Ki67 5%    05/22/2016 Initial Biopsy    Diagnosis Breast, left, needle core biopsy, upper inner - INVASIVE AND IN SITU LOBULAR CARCINOMA, G1-2    05/22/2016 Initial Diagnosis    Malignant neoplasm of upper-inner quadrant of left breast in [place], [place] positive [place]    05/31/2016 Genetic Testing    Genetic counseling and testing for hereditary cancer syndromes performed on 05/31/2016. Results are negative for pathogenic mutations in 46 genes analyzed by [facility]'s Common Hereditary Cancers Panel. Results  are dated 06/11/2016. Genes analyzed include: APC, ATM, AXIN2, BARD1, BMPR1A, BRCA1, BRCA2, BRIP1, CDH1, CDKN2A, CHEK2, CTNNA1, DICER1, EPCAM, GREM1, HOXB13, KIT, MEN1, MLH1, MSH2, MSH3, MSH6, MUTYH, NBN, NF1, NTHL1, PALB2, PDGFRA, PMS2, POLD1, POLE, PTEN, RAD50, RAD51C, RAD51D, SDHA, SDHB, SDHC, SDHD, SMAD4, SMARCA4, STK11, TP53, TSC1, TSC2, and VHL.     07/01/2016 Oncotype testing    Oncotype testing showed reoccurance score of 11. With 10 year risk of distant reoccurrence 7% with tamoxifen alone    07/04/2016 Surgery    Left breast lumpectomy with radioactive seed x2 and sentinel lymph node biopsy by Dr. BBarry Dienes   08/08/2016 - 09/24/2016 Radiation Therapy    Patient underwent radiation with Dr.Moody    09/2016 -  Anti-estrogen oral therapy    Letrozole 2.5 mg daily starting 09/2016    05/13/2017 Mammogram    Bilateral diagnostic mammogram and left ultrasonography  IMPRESSION:  1. No suspicious mammographic or sonographic abnormalities in the areas of patient's LEFT breast pain.  2. No mammographic evidence of breast malignancy.     10/14/2017 Imaging    10/14/2017 Breast MRI IMPRESSION: No MRI evidence of malignancy in either breast. Left lumpectomy changes. No adenopathy.      CURRENT THERAPY:  Letrozole 2.'5mg'$  once daily starting 09/2016   INTERVAL HISTORY:  Sally WHEELINGis here for a follow up of her left breast cancer. She was last seen by me 7 months ago. She presents to the clinic today accompanied by her husband. She notes new pain in her across her upper chest after picking up a heavy box last week. She has soreness in upper chest now. She  is using tramadol and ice. She has some left arm limitation in ROM. She notes her left breast lump is still there. She notes good hair growth after chemo treatment. She is tolerating Letrozole with no issues.  She has 10 tramadol pills left and uses it only as needed. She would like a refill of this and Radiaplex gel from dry skin post  radiation. She notes her last colonoscopy was 6 years ago and showed diverticulitis. She stays up to date on her cancer screenings and being seen by PCP. Her cholesterol has improved with medication.      REVIEW OF SYSTEMS:   Constitutional: Denies fevers, chills or abnormal weight loss Eyes: Denies blurriness of vision Ears, nose, mouth, throat, and face: Denies mucositis or sore throat Respiratory: Denies cough, dyspnea or wheezes Cardiovascular: Denies palpitation, chest discomfort or lower extremity swelling Gastrointestinal:  Denies nausea, heartburn or change in bowel habits Skin: Denies abnormal skin rashes MSK: (+) Soreness across upper chest  Lymphatics: Denies new lymphadenopathy or easy bruising Neurological:Denies numbness, tingling or new weaknesses Behavioral/Psych: Mood is stable, no new changes  BREAST: (+) stable left breast lump All other systems were reviewed with the patient and are negative.  MEDICAL HISTORY:  Past Medical History:  Diagnosis Date  . Anxiety   . Breast cancer ([place]) 07/03/2016  . Cancer of left breast ([place])    [facility] genetic testing negative on 05/31/16  . Genetic testing 06/18/2016   [name] underwent genetic counseling and testing for hereditary cancer syndromes on 05/31/2016. Her results were negative for mutations in all 46 genes analyzed by [facility]'s 46-gene Common Hereditary Cancers Panel. Genes analyzed include: APC, ATM, AXIN2, BARD1, BMPR1A, BRCA1, BRCA2, BRIP1, CDH1, CDKN2A, CHEK2, CTNNA1, DICER1, EPCAM, GREM1, HOXB13, KIT, MEN1, MLH1, MSH2, MSH3, MSH6, MUTYH, NBN,  . Personal history of radiation therapy 2018   Left Breast Cancer  . Skin cancer    left breast     SURGICAL HISTORY: Past Surgical History:  Procedure Laterality Date  . BREAST LUMPECTOMY Left 07/03/2016  . BREAST LUMPECTOMY WITH RADIOACTIVE SEED AND SENTINEL LYMPH NODE BIOPSY Left 07/04/2016   Procedure: LEFT BREAST LUMPECTOMY WITH RADIOACTIVE SEED X 2 AND SENTINEL  LYMPH NODE BIOPSY;  Surgeon: Sally Klein, MD;  Location: [facility];  Service: General;  Laterality: Left;  . surgery on right leg    . TONSILLECTOMY      I have reviewed the social history and family history with the patient and they are unchanged from previous note.  ALLERGIES:  is allergic to bee venom; latex; and sulfur.  MEDICATIONS:  Current Outpatient Medications  Medication Sig Dispense Refill  . acetaminophen (TYLENOL) 500 MG tablet Take 1,000 mg by mouth every 6 (six) hours as needed for mild pain, moderate pain, fever or headache.    . Calcium Carb-Cholecalciferol (CALCIUM-VITAMIN D) 500-200 MG-UNIT tablet Take 1 tablet by mouth daily.     . cholecalciferol (VITAMIN D) 1000 units tablet Take 1,000 Units by mouth daily.    Marland Kitchen docusate sodium (COLACE) 100 MG capsule Take 100 mg by mouth 2 (two) times a week.    Marland Kitchen EPINEPHrine 0.3 mg/0.3 mL IJ SOAJ injection Inject 0.3 mg into the muscle as needed (for allergic reaction).     . hyaluronate sodium (RADIAPLEXRX) GEL Apply 1 application topically once as needed for up to 1 dose. 170 g 2  . HYDROcodone-acetaminophen (NORCO/VICODIN) 5-325 MG tablet Take 2 tablets by mouth every 4 (four) hours as needed. 15 tablet  0  . ibuprofen (ADVIL,MOTRIN) 200 MG tablet Take 400 mg by mouth every 6 (six) hours as needed for fever, headache, mild pain, moderate pain or cramping.    Marland Kitchen letrozole (FEMARA) 2.5 MG tablet Take 1 tablet (2.5 mg total) by mouth daily. 90 tablet 3  . LORazepam (ATIVAN) 0.5 MG tablet Take 0.5-1 tablets (0.25-0.5 mg total) at bedtime as needed by mouth for anxiety. 1 tablet po 30 minutes prior to radiation and or qHS prn anxiety 30 tablet 2  . meclizine (DRAMAMINE II) 25 MG tablet Take 1 tablet (25 mg total) by mouth 3 (three) times daily as needed for dizziness. 20 tablet 0  . ondansetron (ZOFRAN-ODT) 4 MG disintegrating tablet Take 1 tablet (4 mg total) by mouth every 8 (eight) hours as needed for nausea. 10 tablet  0  . potassium chloride (KLOR-CON) 20 MEQ packet Take 20 mEq by mouth 2 (two) times daily. 7 packet 0  . traMADol (ULTRAM) 50 MG tablet Take 1 tablet (50 mg total) by mouth every 12 (twelve) hours as needed. 40 tablet 0  . Travoprost, BAK Free, (TRAVATAN) 0.004 % SOLN ophthalmic solution Place 1 drop into both eyes at bedtime.     . tretinoin (RETIN-A) 0.025 % cream Apply small amount to face nightly    . VITAMIN E PO Take 1 tablet by mouth daily.    Marland Kitchen zolpidem (AMBIEN) 10 MG tablet Take 5 mg by mouth at bedtime as needed for sleep.    . hyaluronate sodium (RADIAPLEXRX) GEL Apply 1 application topically once for 1 dose. 170 g 0   No current facility-administered medications for this visit.     PHYSICAL EXAMINATION: ECOG PERFORMANCE STATUS: 0 - Asymptomatic  Vitals:   01/24/18 1138  BP: 132/69  Pulse: 72  Resp: 18  Temp: 97.9 F (36.6 C)  SpO2: 100%   Filed Weights   01/24/18 1138  Weight: 124 lb 8 oz (56.5 kg)    GENERAL:alert, no distress and comfortable SKIN: skin color, texture, turgor are normal, no rashes or significant lesions EYES: normal, Conjunctiva are pink and non-injected, sclera clear OROPHARYNX:no exudate, no erythema and lips, buccal mucosa, and tongue normal  NECK: supple, thyroid normal size, non-tender, without nodularity LYMPH:  no palpable lymphadenopathy in the cervical, axillary or inguinal LUNGS: clear to auscultation and percussion with normal breathing effort HEART: regular rate & rhythm and no murmurs and no lower extremity edema ABDOMEN:abdomen soft, non-tender and normal bowel sounds Musculoskeletal:no cyanosis of digits and no clubbing  NEURO: alert & oriented x 3 with fluent speech, no focal motor/sensory deficits BREAST: S/p left breast lumpectomy: Surgical incision healed well (+) 7cm from nipple a small bulging chest wall with small nodule in left breast at 10:00 position (+) left breast tenderness (+) right breast benign  LABORATORY DATA:    I have reviewed the data as listed CBC Latest Ref Rng & Units 01/24/2018 10/24/2017 07/18/2017  WBC 4.0 - 10.5 K/uL 6.1 5.1 4.1  Hemoglobin 12.0 - 15.0 g/dL 13.7 13.2 13.0  Hematocrit 36.0 - 46.0 % 40.3 39.1 38.4  Platelets 150 - 400 K/uL 193 162 136(L)     CMP Latest Ref Rng & Units 01/24/2018 10/24/2017 07/18/2017  Glucose 70 - 99 mg/dL 92 99 94  BUN 6 - 20 mg/dL '8 7 7  '$ Creatinine 0.44 - 1.00 mg/dL 0.76 0.78 0.70  Sodium 135 - 145 mmol/L 139 140 139  Potassium 3.5 - 5.1 mmol/L 3.5 4.0 3.7  Chloride 98 - 111  mmol/L 99 102 104  CO2 22 - 32 mmol/L '29 31 28  '$ Calcium 8.9 - 10.3 mg/dL 9.6 9.8 9.3  Total Protein 6.5 - 8.1 g/dL 8.4(H) 7.9 7.4  Total Bilirubin 0.3 - 1.2 mg/dL 0.6 0.7 0.4  Alkaline Phos 38 - 126 U/L 106 98 78  AST 15 - 41 U/L '21 22 28  '$ ALT 0 - 44 U/L '23 25 29      '$ RADIOGRAPHIC STUDIES: I have personally reviewed the radiological images as listed and agreed with the findings in the report. No results found.   ASSESSMENT & PLAN:  Sally Smith is a 57 y.o. female with    1. 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 She is s/p left breast lumpectomy and radiation. She is currently on adjuvant Letrozole since 09/2016.  -She is clinically doing well. Lab reviewed, her CBC and CMP WNL except MCV at 102.3. Her physical exam and her 10/2017 mammogram were unremarkable, except post surgical changes and fat necrosis. There is no clinical concern for recurrence. -I encouraged her to continue multivitamin.  -Continue letrozole,plan for 7-10 years if tolerates well, due to her lobular histology  -Next mammogram 04/2018 -F/u in 6 months    2. Left chest wall pain and tenderness  -She previously had left breast pain after surgery.  She has developed left substernal chest pain after mild trama to left chest, x-ray was negative for fractures, breast MRI  was unremarkabl except a surgical changes -She reports worsening left chest pain and tenderness after heavy lifting a week ago. -She is fine to lift anything under 5 pounds. I encouraged her to continue exercise and stretching to increase ROM and muscle strength -She uses tramadol only as needed for pain, soreness. I again discussed not using frequently as she can become dependent. She understands.  -I refilled tramadol #40 tablets today (01/24/18)  3. Osteopenia  -Her 10/29/16 DEXA shows osteopenia with a left femur neck T-Score of -1.7.  -We previously discussed potential osteopenia and osteoporosis from letrozole, we'll monitor her bone density scan every 2 years. -She will continue calcium and vitamin D supplements. -Due for DEXA 10/2018, will order at next visit   4. Genetic Counseling -Her genetic test was negative  5. Glaucoma -Managed by her PCP. She takes Travatan drops.  6. Anxiety -She use Ativan 0.'5mg'$  at night and another 0.'5mg'$  as needed  -Continue to f/u with PCP    7. Hypokalemia  -She completed oral potassium dose.  -K normal at 3.5 today  -I encouraged her to continue to increase potassium in diet.   PLAN -I refilled tramadol and Radiaplex gel today  -Lab and f/u in 6 months  -Mammogram in 04/2018 -continue Letrozole       No problem-specific Assessment & Plan notes found for this encounter.   Orders Placed This Encounter  Procedures  . MM DIAG BREAST TOMO BILATERAL    Standing Status:   Future    Standing Expiration Date:   01/25/2019    Order Specific Question:   Reason for Exam (SYMPTOM  OR DIAGNOSIS REQUIRED)    Answer:   screening    Order Specific Question:   Is the patient pregnant?    Answer:   No    Order Specific Question:   Preferred imaging location?    Answer:   [facility]   All questions  were answered. The patient knows to call the clinic with any problems, questions or concerns. No barriers to learning was detected. I spent 20  minutes counseling the patient face to face. The total time spent in the appointment was 25 minutes and more than 50% was on counseling and review of test results     Sally Merle, MD 01/24/2018   I, Joslyn Devon, am acting as scribe for Sally Merle, MD.   I have reviewed the above documentation for accuracy and completeness, and I agree with the above.

## 2018-01-24 NOTE — Telephone Encounter (Signed)
Printed calendar and avs. °

## 2018-01-25 ENCOUNTER — Encounter: Payer: Self-pay | Admitting: Hematology

## 2018-05-15 ENCOUNTER — Other Ambulatory Visit: Payer: Self-pay | Admitting: Hematology

## 2018-05-15 MED ORDER — TRAMADOL HCL 50 MG PO TABS: 50.0000 mg | ORAL_TABLET | Freq: Two times a day (BID) | ORAL | 0 refills | Status: DC | PRN

## 2018-06-04 ENCOUNTER — Telehealth: Payer: Self-pay | Admitting: Hematology

## 2018-06-04 NOTE — Telephone Encounter (Signed)
Pt requested refill tramadol today. It was last refilled on 05/15/2018, and she has been taking 2-3 tabs a week, and she still has about 25 tabs left. She is very concerned about the current COVID-19 outbreak.  She has baseline mild anxiety and depression, not on medication, it has been worse since her cancer diagnosis, much worse lately due to the COVID-19 pandemic.  We discussed anxiety management, and I offered her to talk to our social worker for counseling, she declined at this point.  We also discussed SSRI for her underline mood disorder, and she will think about it. she doing meditation, exercise, to help herself.  I discouraged her to use pain medication for her muscular pain, which is partially related to her underlying anxiety. She knows to okay to take Tylenol as needed.  I will refill her tramadol in a month, and will try to wean it off in future.   [name]  06/04/2018

## 2018-06-18 ENCOUNTER — Inpatient Hospital Stay: Admission: RE | Admit: 2018-06-18 | Payer: BLUE CROSS/BLUE SHIELD | Source: Ambulatory Visit

## 2018-06-25 ENCOUNTER — Other Ambulatory Visit: Payer: Self-pay | Admitting: Hematology

## 2018-06-26 ENCOUNTER — Other Ambulatory Visit: Payer: Self-pay | Admitting: Nurse Practitioner

## 2018-06-26 MED ORDER — TRAMADOL HCL 50 MG PO TABS: 50.0000 mg | ORAL_TABLET | Freq: Two times a day (BID) | ORAL | 0 refills | Status: DC | PRN

## 2018-06-26 MED ORDER — LETROZOLE 2.5 MG PO TABS: 2.5000 mg | ORAL_TABLET | Freq: Every day | ORAL | 0 refills | Status: DC

## 2018-06-26 NOTE — Telephone Encounter (Signed)
Pt requesting refills.  Thanks, [employer]

## 2018-07-18 ENCOUNTER — Other Ambulatory Visit: Payer: BLUE CROSS/BLUE SHIELD

## 2018-07-21 NOTE — Progress Notes (Signed)
Sally Smith   [phone] [phone]   Clinic Follow up Note   Patient Care Team: Hoyt Koch, MD as PCP - General (Obstetrics and Gynecology) [name], MD as Consulting Physician (Hematology) Stark Klein, MD as Consulting Physician (General Surgery) Kyung Rudd, MD as Consulting Physician (Radiation Oncology) Gardenia Phlegm, NP as Nurse Practitioner (Hematology and Oncology)  Date of Service:  07/23/2018  CHIEF COMPLAINT: F/u of left breast cancer  SUMMARY OF ONCOLOGIC HISTORY: Oncology History   Cancer Staging Malignant neoplasm of upper-inner quadrant of left breast in female, estrogen receptor positive ([place]) Staging form: Breast, AJCC 8th Edition - Clinical stage from 05/22/2016: Stage IA (cT1a, cN0, cM0, G2, ER: Positive, PR: Positive, HER2: Negative) - Signed by [name], MD on 05/29/2016       Malignant neoplasm of upper-inner quadrant of left breast in female, estrogen receptor positive ([place])   05/10/2016 Mammogram    Bilateral screening mammogram on 05/10/16 showed a possible distortion with calcifications in the left breast.    05/16/2016 Mammogram    Diagnostic mammogram and US showed Persistent distortion in the upper inner quadrant of the left breast possibly correlating with the sonographic area of acoustic shadowing, measuring about 69m. UKoreaof left axilla (-)    05/22/2016 Receptors her2    ER 100%, PR 70%+, Ki67 5%    05/22/2016 Initial Biopsy    Diagnosis Breast, left, needle core biopsy, upper inner - INVASIVE AND IN SITU LOBULAR CARCINOMA, G1-2    05/22/2016 Initial Diagnosis    Malignant neoplasm of upper-inner quadrant of left breast in female, estrogen receptor positive [place]    05/31/2016 Genetic Testing    Genetic counseling and testing for hereditary cancer syndromes performed on 05/31/2016. Results are negative for pathogenic mutations in 46 genes analyzed by [facility]'s Common Hereditary Cancers Panel. Results  are dated 06/11/2016. Genes analyzed include: APC, ATM, AXIN2, BARD1, BMPR1A, BRCA1, BRCA2, BRIP1, CDH1, CDKN2A, CHEK2, CTNNA1, DICER1, EPCAM, GREM1, HOXB13, KIT, MEN1, MLH1, MSH2, MSH3, MSH6, MUTYH, NBN, NF1, NTHL1, PALB2, PDGFRA, PMS2, POLD1, POLE, PTEN, RAD50, RAD51C, RAD51D, SDHA, SDHB, SDHC, SDHD, SMAD4, SMARCA4, STK11, TP53, TSC1, TSC2, and VHL.     07/01/2016 Oncotype testing    Oncotype testing showed reoccurance score of 11. With 10 year risk of distant reoccurrence 7% with tamoxifen alone    07/04/2016 Surgery    Left breast lumpectomy with radioactive seed x2 and sentinel lymph node biopsy by Dr. BBarry Dienes   08/08/2016 - 09/24/2016 Radiation Therapy    Patient underwent radiation with Dr.Moody    09/2016 -  Anti-estrogen oral therapy    Letrozole 2.5 mg daily starting 09/2016    05/13/2017 Mammogram    Bilateral diagnostic mammogram and left ultrasonography  IMPRESSION:  1. No suspicious mammographic or sonographic abnormalities in the areas of patient's LEFT breast pain.  2. No mammographic evidence of breast malignancy.     10/14/2017 Imaging    10/14/2017 Breast MRI IMPRESSION: No MRI evidence of malignancy in either breast. Left lumpectomy changes. No adenopathy.      CURRENT THERAPY:  Letrozole 2.'5mg'$  once daily starting 09/2016   INTERVAL HISTORY:  Sally AMBROCIOis here for a follow up of left breast cancer. She presents to the clinic alone. She notes she is doing well. She notes she is staying home most of the time. She notes her husband continued to work and she was nervous about it. She notes she has not walked as much in the last  2 weeks due to allergies. She notes she fell 4 days ago when tripping on vacuum cord and fell over her foot stool and landed on her tile floor. She hit her head and thinks she blacked out. She did have headaches following day of and the day after. She is still sire. She has not trouble ambulated. She did not go to ED or urgent care. She notes  she still has residual chest pain from her breast cancer. For the pain she used Tramadol a little more than usual. She uses this with caution.     REVIEW OF SYSTEMS:   Constitutional: Denies fevers, chills or abnormal weight loss Eyes: Denies blurriness of vision Ears, nose, mouth, throat, and face: Denies mucositis or sore throat Respiratory: Denies cough, dyspnea or wheezes Cardiovascular: Denies palpitation or lower extremity swelling (+) residual chest pain  Gastrointestinal: Denies nausea, heartburn or change in bowel habits Skin: Denies abnormal skin rashes MSK: (+) Soreness of legs, buttock from recent fall  Lymphatics: Denies new lymphadenopathy or easy bruising Neurological:Denies numbness, tingling or new weaknesses Behavioral/Psych: Mood is stable, no new changes  BREAST: Tenderness of rib below left breast incision All other systems were reviewed with the patient and are negative.  MEDICAL HISTORY:  Past Medical History:  Diagnosis Date   Anxiety    Breast cancer ([place]) 07/03/2016   Cancer of left breast ([place])    [facility] genetic testing negative on 05/31/16   Genetic testing 06/18/2016   Ms. Chisum underwent genetic counseling and testing for hereditary cancer syndromes on 05/31/2016. Her results were negative for mutations in all 46 genes analyzed by [facility]'s 46-gene Common Hereditary Cancers Panel. Genes analyzed include: APC, ATM, AXIN2, BARD1, BMPR1A, BRCA1, BRCA2, BRIP1, CDH1, CDKN2A, CHEK2, CTNNA1, DICER1, EPCAM, GREM1, HOXB13, KIT, MEN1, MLH1, MSH2, MSH3, MSH6, MUTYH, NBN,   Personal history of radiation therapy 2018   Left Breast Cancer   Skin cancer    left breast     SURGICAL HISTORY: Past Surgical History:  Procedure Laterality Date   BREAST LUMPECTOMY Left 07/03/2016   BREAST LUMPECTOMY WITH RADIOACTIVE SEED AND SENTINEL LYMPH NODE BIOPSY Left 07/04/2016   Procedure: LEFT BREAST LUMPECTOMY WITH RADIOACTIVE SEED X 2 AND SENTINEL LYMPH NODE BIOPSY;   Surgeon: Stark Klein, MD;  Location: [facility];  Service: General;  Laterality: Left;   surgery on right leg     TONSILLECTOMY      I have reviewed the social history and family history with the patient and they are unchanged from previous note.  ALLERGIES:  is allergic to bee venom; erythromycin base; latex; and sulfur.  MEDICATIONS:  Current Outpatient Medications  Medication Sig Dispense Refill   acetaminophen (TYLENOL) 500 MG tablet Take 1,000 mg by mouth every 6 (six) hours as needed for mild pain, moderate pain, fever or headache.     Calcium Carb-Cholecalciferol (CALCIUM-VITAMIN D) 500-200 MG-UNIT tablet Take 1 tablet by mouth daily.      cholecalciferol (VITAMIN D) 1000 units tablet Take 1,000 Units by mouth daily.     docusate sodium (COLACE) 100 MG capsule Take 100 mg by mouth 2 (two) times a week.     EPINEPHrine 0.3 mg/0.3 mL IJ SOAJ injection Inject 0.3 mg into the muscle as needed (for allergic reaction).      hyaluronate sodium (RADIAPLEXRX) GEL Apply 1 application topically once as needed for up to 1 dose. 170 g 2   ibuprofen (ADVIL,MOTRIN) 200 MG tablet Take 400 mg by mouth every 6 (  six) hours as needed for fever, headache, mild pain, moderate pain or cramping.     letrozole (FEMARA) 2.5 MG tablet Take 1 tablet (2.5 mg total) by mouth daily. 90 tablet 3   LORazepam (ATIVAN) 0.5 MG tablet Take 0.5-1 tablets (0.25-0.5 mg total) at bedtime as needed by mouth for anxiety. 1 tablet po 30 minutes prior to radiation and or qHS prn anxiety 30 tablet 2   meclizine (DRAMAMINE II) 25 MG tablet Take 1 tablet (25 mg total) by mouth 3 (three) times daily as needed for dizziness. 20 tablet 0   ondansetron (ZOFRAN-ODT) 4 MG disintegrating tablet Take 1 tablet (4 mg total) by mouth every 8 (eight) hours as needed for nausea. 10 tablet 0   potassium chloride (KLOR-CON) 20 MEQ packet Take 20 mEq by mouth 2 (two) times daily. 7 packet 0   traMADol (ULTRAM) 50 MG  tablet Take 1 tablet (50 mg total) by mouth every 12 (twelve) hours as needed. 20 tablet 0   Travoprost, BAK Free, (TRAVATAN) 0.004 % SOLN ophthalmic solution Place 1 drop into both eyes at bedtime.      tretinoin (RETIN-A) 0.025 % cream Apply small amount to face nightly     VITAMIN E PO Take 1 tablet by mouth daily.     zolpidem (AMBIEN) 10 MG tablet Take 5 mg by mouth at bedtime as needed for sleep.     ALPRAZolam (XANAX) 0.25 MG tablet Take 0.25 mg by mouth daily as needed.     atorvastatin (LIPITOR) 20 MG tablet Take 20 mg by mouth daily.     gabapentin (NEURONTIN) 100 MG capsule Take 1 capsule (100 mg total) by mouth 3 (three) times daily. 90 capsule 0   HYDROcodone-acetaminophen (NORCO/VICODIN) 5-325 MG tablet Take 2 tablets by mouth every 4 (four) hours as needed. (Patient not taking: Reported on 07/23/2018) 15 tablet 0   No current facility-administered medications for this visit.     PHYSICAL EXAMINATION: ECOG PERFORMANCE STATUS: 1 - Symptomatic but completely ambulatory  Vitals:   07/23/18 1104  BP: (!) 130/58  Pulse: 71  Resp: 16  Temp: (!) 97.3 F (36.3 C)  SpO2: 100%   Filed Weights   07/23/18 1104  Weight: 133 lb 11.2 oz (60.6 kg)    GENERAL:alert, no distress and comfortable SKIN: skin color, texture, turgor are normal, no rashes or significant lesions EYES: normal, Conjunctiva are pink and non-injected, sclera clear  NECK: supple, thyroid normal size, non-tender, without nodularity LYMPH:  no palpable lymphadenopathy in the cervical, axillary  LUNGS: clear to auscultation and percussion with normal breathing effort HEART: regular rate & rhythm and no murmurs and no lower extremity edema ABDOMEN:abdomen soft, non-tender and normal bowel sounds Musculoskeletal:no cyanosis of digits and no clubbing  NEURO: alert & oriented x 3 with fluent speech, no focal motor/sensory deficits BREAST: (+) S/p left breast lumpectomy: No palpable mass or adenopathy (+)  Tenderness of rib under incision with prominence possibly from scar tissue.   LABORATORY DATA:  I have reviewed the data as listed CBC Latest Ref Rng & Units 07/23/2018 01/24/2018 10/24/2017  WBC 4.0 - 10.5 K/uL 5.6 6.1 5.1  Hemoglobin 12.0 - 15.0 g/dL 12.4 13.7 13.2  Hematocrit 36.0 - 46.0 % 36.7 40.3 39.1  Platelets 150 - 400 K/uL 151 193 162     CMP Latest Ref Rng & Units 07/23/2018 01/24/2018 10/24/2017  Glucose 70 - 99 mg/dL 100(H) 92 99  BUN 6 - 20 mg/dL '12 8 7  '$ Creatinine  0.44 - 1.00 mg/dL 0.80 0.76 0.78  Sodium 135 - 145 mmol/L 137 139 140  Potassium 3.5 - 5.1 mmol/L 3.5 3.5 4.0  Chloride 98 - 111 mmol/L 102 99 102  CO2 22 - 32 mmol/L '27 29 31  '$ Calcium 8.9 - 10.3 mg/dL 9.6 9.6 9.8  Total Protein 6.5 - 8.1 g/dL 8.3(H) 8.4(H) 7.9  Total Bilirubin 0.3 - 1.2 mg/dL 0.5 0.6 0.7  Alkaline Phos 38 - 126 U/L 85 106 98  AST 15 - 41 U/L '18 21 22  '$ ALT 0 - 44 U/L '19 23 25      '$ RADIOGRAPHIC STUDIES: I have personally reviewed the radiological images as listed and agreed with the findings in the report. No results found.   ASSESSMENT & PLAN:  KARLETTA MILLAY is a 58 y.o. female with   1. 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 She is s/p left breast lumpectomy and radiation. She is currently on adjuvant Letrozole since 09/2016. Tolerating well, plan for 5-7 years. -She is clinically doing well. Lab reviewed, her CBC and CMP WNL except MCV 101.9, protein 8.3. Her physical exam was unremarkable except tenderness at left breast incision site which is chronic. Her 10/2017 breast MRI was unremarkable. There is no clinical concern for recurrence. -I encouraged her to continue multivitamin and B12.  -Continue surveillance. Next mammogram in 07/2018.  -Continue Letrozole  -F/u in 6 months   2. Left chest wall pain and tenderness -She previously had left breast pain  after surgery. She has developed left substernal chest pain after mild trama to left chest, 10/2017 x-ray was negative for fractures, breast MRI was unremarkably except surgical changes -She uses tramadol only as needed for pain, soreness. She understands not using frequently as she can become dependent. -She had a bad trip and fall 4 days ago. She possibly blacked out. She had a 2 day HA which has resolved. She is sore with bruising. Her chest pain was exacerbated by this. Rib very tender and prominent below left breast surgical incision on exam today (07/23/18). Overall stable from last year's exam.  -I discussed the tenderness is likely nerve pain from surgery and the prominence possibly from scar tissue. I will call in Gabapentin starting '100mg'$  nightly and titrate up to '300mg'$  TID if needed -I will inform Dr Barry Dienes also.   3. Osteopenia  -Her 10/29/16 DEXA shows osteopenia with a left femur neck T-Score of -1.7.  -Wepreviouslydiscussed potential osteopenia and osteoporosis from letrozole, we'll monitor her bone density scan every 2 years. -She will continue calcium and vitamin D supplements. -Due for DEXA 01/2019  -will check VitD level every 6 months   4. Genetic Testing was negative   5. Glaucoma -Managed by her PCP. She takes Travatan drops.  6. Anxiety -She use Ambien at night to help her sleep as needed  -Continue to f/u with PCP  -She has been very anxious about COVID-19. I encouraged her to continue COVID-19 precautions and social distancing including from family members as needed.   7. Macrocytosis without anemia  -MCV has been slightly elevated, at 101.9 today (07/23/18 -I will check folate and B12 level at next visit. She is on oral B12.    PLAN -I called in Gabapentin today for her left chest pain at previous lumpectomy site  -Lab and f/u in 6 months  -Mammogram in 07/2018, DEXA in  01/2019 -continue Letrozole, refilled today  -copy Dr. Barry Dienes     No  problem-specific Assessment & Plan notes found for this encounter.   Orders Placed This Encounter  Procedures   DG Bone Density    Standing Status:   Future    Standing Expiration Date:   07/23/2019    Order Specific Question:   Reason for Exam (SYMPTOM  OR DIAGNOSIS REQUIRED)    Answer:   screening    Order Specific Question:   Is the patient pregnant?    Answer:   No    Order Specific Question:   Preferred imaging location?    Answer:   [facility]   Vitamin D 25 hydroxy    Standing Status:   Standing    Number of Occurrences:   20    Standing Expiration Date:   07/23/2023   Folate, Serum    Standing Status:   Future    Standing Expiration Date:   07/23/2019   Vitamin B12    Standing Status:   Future    Standing Expiration Date:   07/23/2019   All questions were answered. The patient knows to call the clinic with any problems, questions or concerns. No barriers to learning was detected. I spent 20 minutes counseling the patient face to face. The total time spent in the appointment was 25 minutes and more than 50% was on counseling and review of test results     [name], MD 07/23/2018   I, Joslyn Devon, am acting as scribe for [name], MD.   I have reviewed the above documentation for accuracy and completeness, and I agree with the above.

## 2018-07-23 ENCOUNTER — Inpatient Hospital Stay: Payer: BC Managed Care – PPO | Attending: Hematology | Admitting: Hematology

## 2018-07-23 ENCOUNTER — Telehealth: Payer: Self-pay | Admitting: Hematology

## 2018-07-23 ENCOUNTER — Inpatient Hospital Stay: Payer: BC Managed Care – PPO

## 2018-07-23 ENCOUNTER — Other Ambulatory Visit: Payer: Self-pay

## 2018-07-23 ENCOUNTER — Encounter: Payer: Self-pay | Admitting: Hematology

## 2018-07-23 VITALS — BP 130/58 | HR 71 | Temp 97.3°F | Resp 16 | Wt 133.7 lb

## 2018-07-23 DIAGNOSIS — E2839 Other primary ovarian failure: Secondary | ICD-10-CM

## 2018-07-23 DIAGNOSIS — H409 Unspecified glaucoma: Secondary | ICD-10-CM | POA: Insufficient documentation

## 2018-07-23 DIAGNOSIS — Z1379 Encounter for other screening for genetic and chromosomal anomalies: Secondary | ICD-10-CM

## 2018-07-23 DIAGNOSIS — C50212 Malignant neoplasm of upper-inner quadrant of left female breast: Secondary | ICD-10-CM | POA: Diagnosis present

## 2018-07-23 DIAGNOSIS — M8580 Other specified disorders of bone density and structure, unspecified site: Secondary | ICD-10-CM | POA: Diagnosis not present

## 2018-07-23 DIAGNOSIS — Z79811 Long term (current) use of aromatase inhibitors: Secondary | ICD-10-CM | POA: Diagnosis not present

## 2018-07-23 DIAGNOSIS — Z170 Estrogen receptor positive status [ER+]: Secondary | ICD-10-CM | POA: Insufficient documentation

## 2018-07-23 DIAGNOSIS — F419 Anxiety disorder, unspecified: Secondary | ICD-10-CM | POA: Diagnosis not present

## 2018-07-23 DIAGNOSIS — D7589 Other specified diseases of blood and blood-forming organs: Secondary | ICD-10-CM | POA: Diagnosis not present

## 2018-07-23 LAB — COMPREHENSIVE METABOLIC PANEL
ALT: 19 U/L (ref 0–44)
AST: 18 U/L (ref 15–41)
Albumin: 4.3 g/dL (ref 3.5–5.0)
Alkaline Phosphatase: 85 U/L (ref 38–126)
Anion gap: 8 (ref 5–15)
BUN: 12 mg/dL (ref 6–20)
CO2: 27 mmol/L (ref 22–32)
Calcium: 9.6 mg/dL (ref 8.9–10.3)
Chloride: 102 mmol/L (ref 98–111)
Creatinine, Ser: 0.8 mg/dL (ref 0.44–1.00)
GFR calc Af Amer: >60 mL/min (ref >60)
GFR calc non Af Amer: >60 mL/min (ref >60)
Glucose, Bld: 100 mg/dL — ABNORMAL HIGH (ref 70–99)
Potassium: 3.5 mmol/L (ref 3.5–5.1)
Sodium: 137 mmol/L (ref 135–145)
Total Bilirubin: 0.5 mg/dL (ref 0.3–1.2)
Total Protein: 8.3 g/dL — ABNORMAL HIGH (ref 6.5–8.1)

## 2018-07-23 LAB — CBC WITH DIFFERENTIAL/PLATELET
Abs Immature Granulocytes: 0.01 10*3/uL (ref 0.00–0.07)
Basophils Absolute: 0 10*3/uL (ref 0.0–0.1)
Basophils Relative: 1 %
Eosinophils Absolute: 0.2 10*3/uL (ref 0.0–0.5)
Eosinophils Relative: 3 %
HCT: 36.7 % (ref 36.0–46.0)
Hemoglobin: 12.4 g/dL (ref 12.0–15.0)
Immature Granulocytes: 0 %
Lymphocytes Relative: 34 %
Lymphs Abs: 1.9 10*3/uL (ref 0.7–4.0)
MCH: 34.4 pg — ABNORMAL HIGH (ref 26.0–34.0)
MCHC: 33.8 g/dL (ref 30.0–36.0)
MCV: 101.9 fL — ABNORMAL HIGH (ref 80.0–100.0)
Monocytes Absolute: 0.6 10*3/uL (ref 0.1–1.0)
Monocytes Relative: 10 %
Neutro Abs: 2.9 10*3/uL (ref 1.7–7.7)
Neutrophils Relative %: 52 %
Platelets: 151 10*3/uL (ref 150–400)
RBC: 3.6 MIL/uL — ABNORMAL LOW (ref 3.87–5.11)
RDW: 12.1 % (ref 11.5–15.5)
WBC: 5.6 10*3/uL (ref 4.0–10.5)
nRBC: 0 % (ref 0.0–0.2)

## 2018-07-23 MED ORDER — GABAPENTIN 100 MG PO CAPS: 100.0000 mg | ORAL_CAPSULE | Freq: Three times a day (TID) | ORAL | 0 refills | Status: DC

## 2018-07-23 MED ORDER — LETROZOLE 2.5 MG PO TABS: 2.5000 mg | ORAL_TABLET | Freq: Every day | ORAL | 3 refills | Status: DC

## 2018-07-23 NOTE — Telephone Encounter (Signed)
Scheduled appt per 6/3 los. ° °A calendar will be mailed out. °

## 2018-08-01 ENCOUNTER — Ambulatory Visit
Admission: RE | Admit: 2018-08-01 | Discharge: 2018-08-01 | Disposition: A | Discharge status: Home / Self Care | Payer: BC Managed Care – PPO | Source: Ambulatory Visit | Attending: Hematology | Admitting: Hematology

## 2018-08-01 ENCOUNTER — Other Ambulatory Visit: Payer: BLUE CROSS/BLUE SHIELD

## 2018-08-01 ENCOUNTER — Other Ambulatory Visit: Payer: Self-pay

## 2018-08-01 ENCOUNTER — Ambulatory Visit: Payer: BLUE CROSS/BLUE SHIELD | Admitting: Hematology

## 2018-08-01 DIAGNOSIS — C50212 Malignant neoplasm of upper-inner quadrant of left female breast: Secondary | ICD-10-CM

## 2018-08-22 IMAGING — CT CT ABD-PELV W/ CM
2 of 5 series · 16 of 46 positions shown, 18 images · IV contrast (ISOVUE)
Comparison: None.

CLINICAL DATA: Severe right lower quadrant pain.

EXAM:
CT ABDOMEN AND PELVIS WITH CONTRAST
TECHNIQUE: Multidetector CT imaging of the abdomen and pelvis was performed
using the standard protocol following bolus administration of
intravenous contrast.
CONTRAST:  100mL QFY0ZJ-5SS IOPAMIDOL (QFY0ZJ-5SS) INJECTION 61%

[Series 2: abd/pel with · axial · 0.74mm/px · z∈[-576,-176]mm · 13 of 94 slices shown, 15 images]
[im 7/94  soft-tissue]
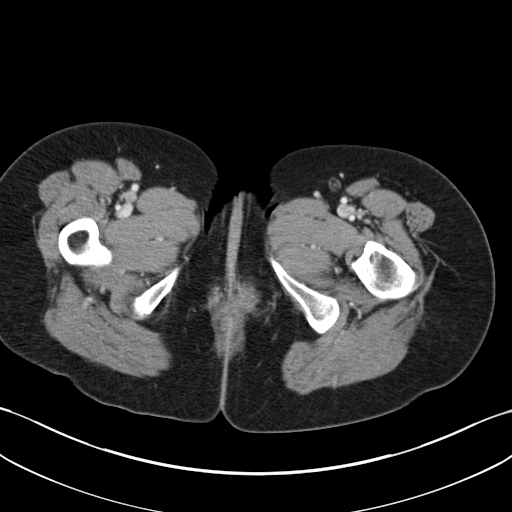
[im 7/94  bone]
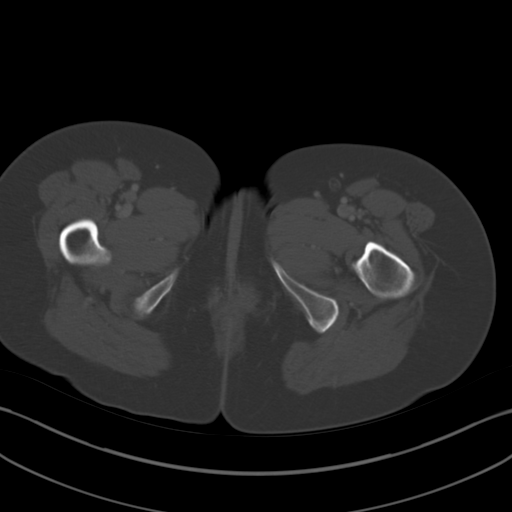
[im 14/94  soft-tissue]
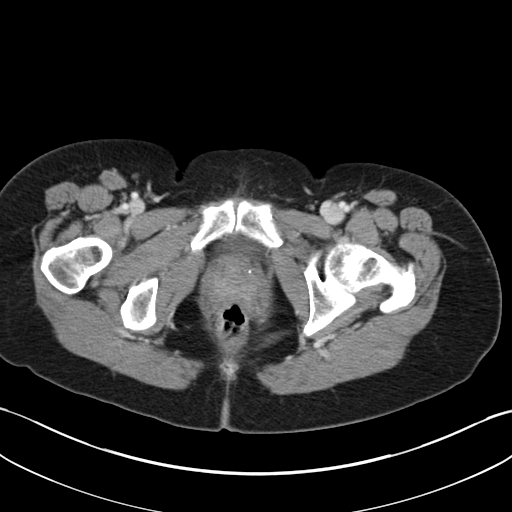
[im 20/94  soft-tissue]
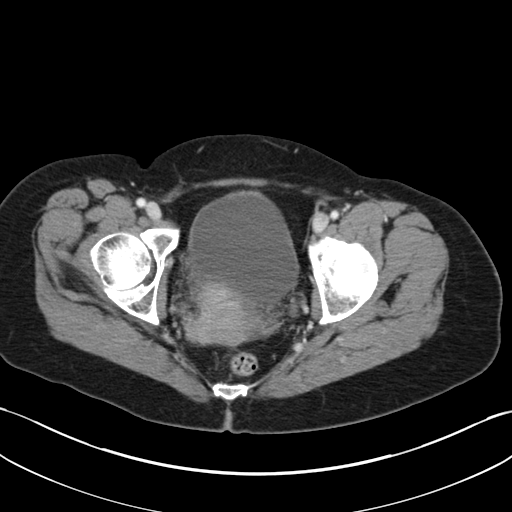
[im 27/94  soft-tissue]
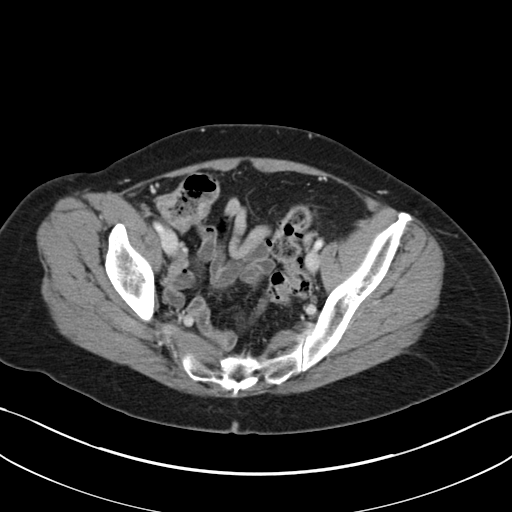
[im 34/94  soft-tissue]
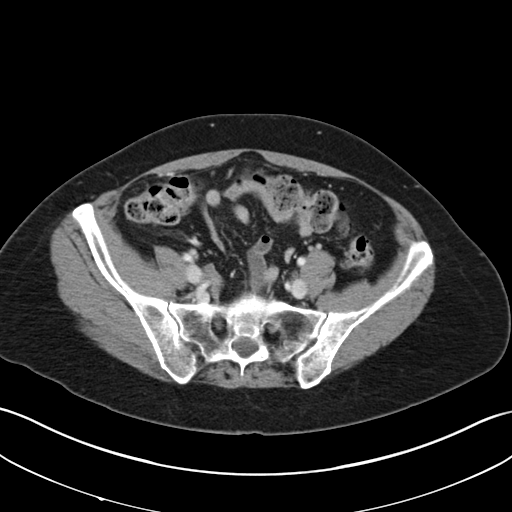
[im 40/94  soft-tissue]
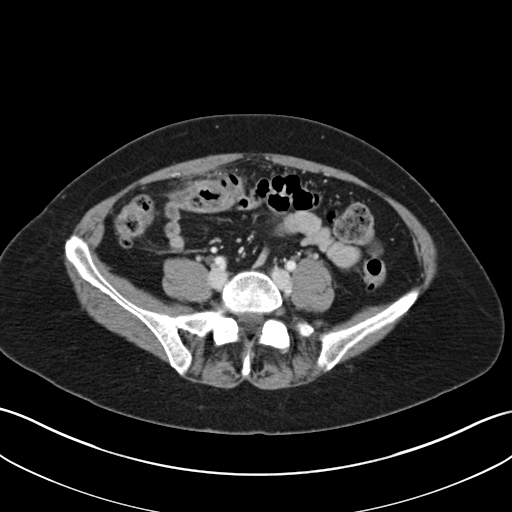
[im 47/94  soft-tissue]
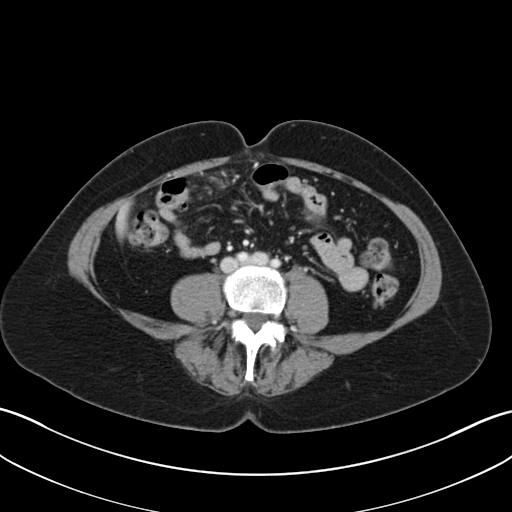
[im 54/94  soft-tissue]
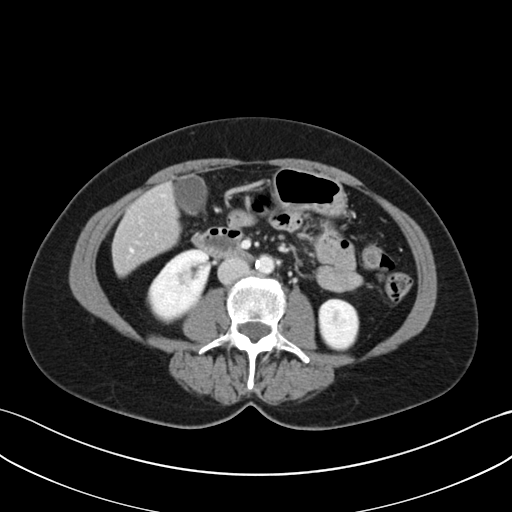
[im 60/94  soft-tissue]
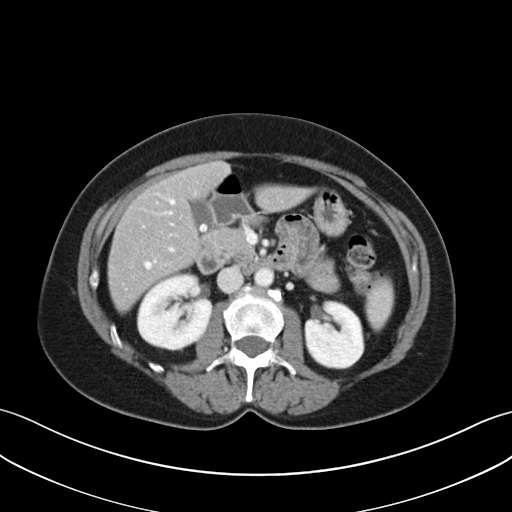
[im 60/94  bone]
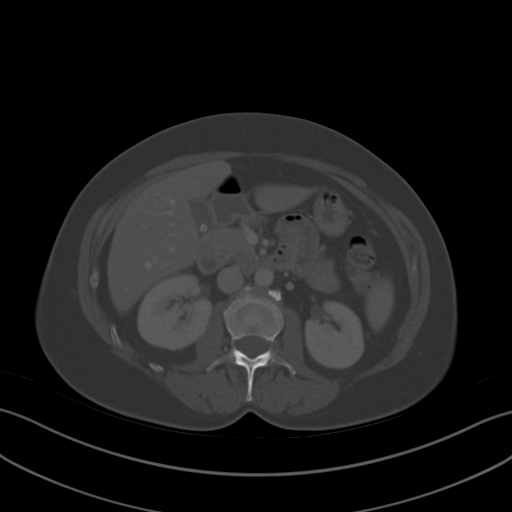
[im 67/94  soft-tissue]
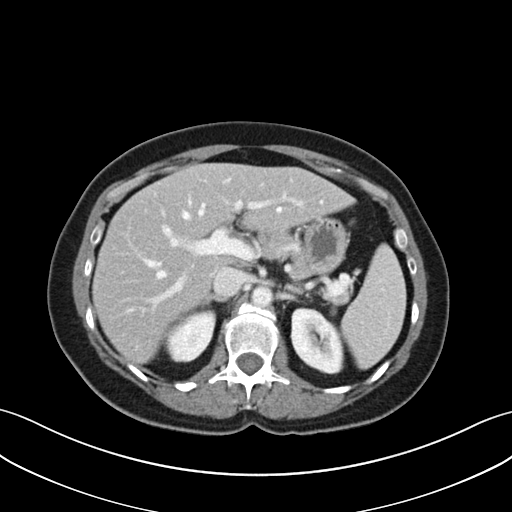
[im 74/94  soft-tissue]
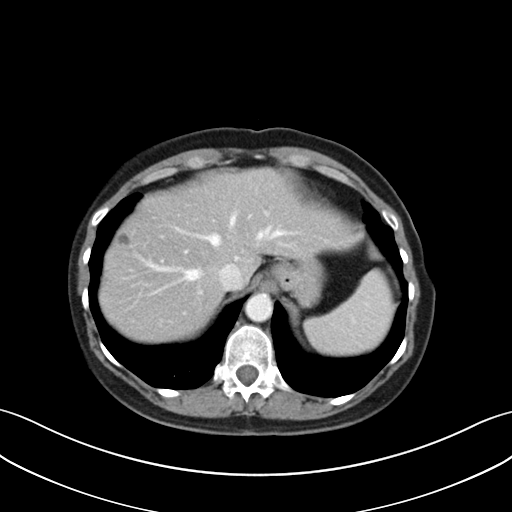
[im 80/94  soft-tissue]
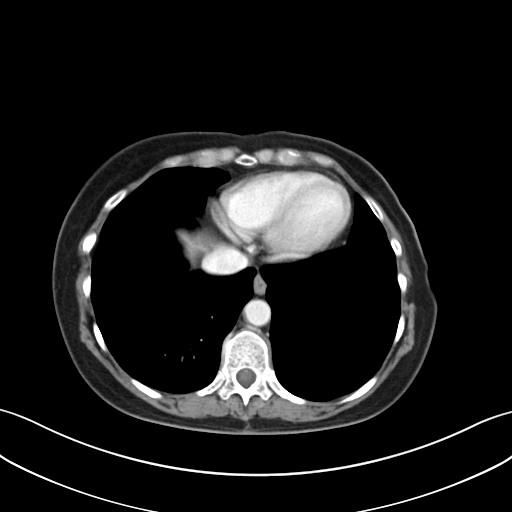
[im 87/94  soft-tissue]
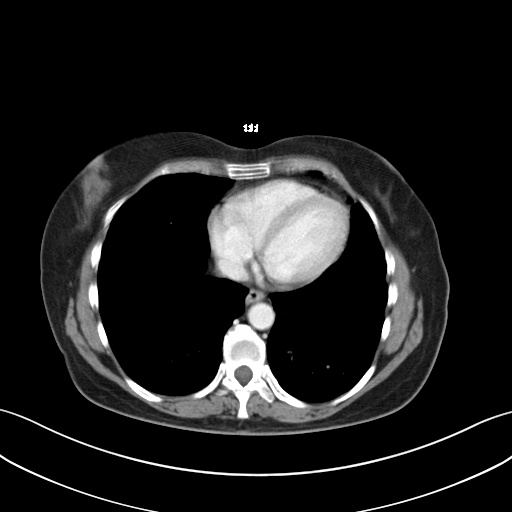

[Series 5: coronal a/|p · coronal · 0.79mm/px · 3 of 141 slices shown]
[im 47/141  soft-tissue]
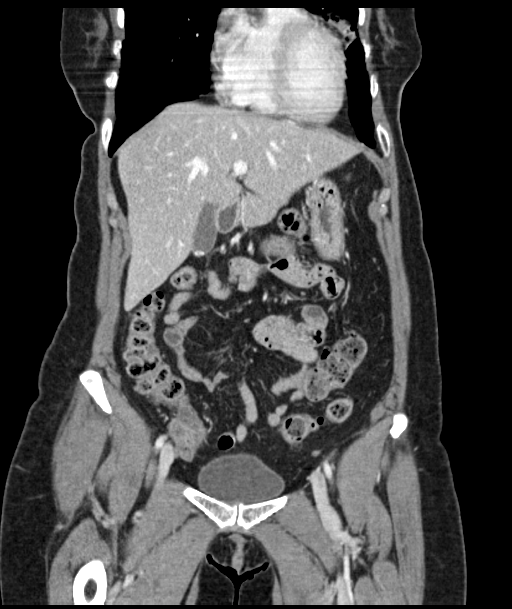
[im 63/141  soft-tissue]
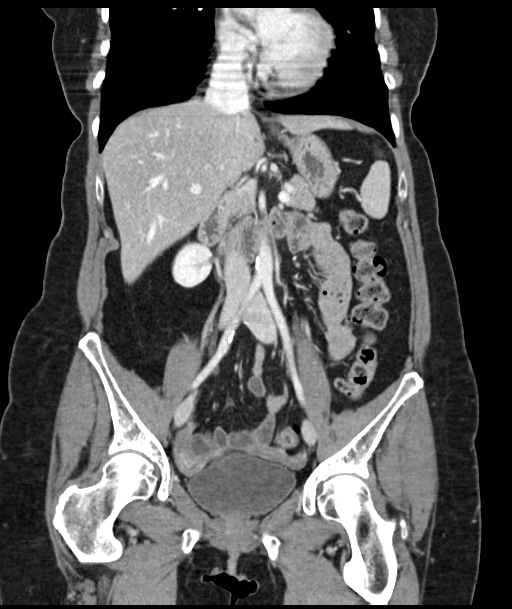
[im 78/141  soft-tissue]
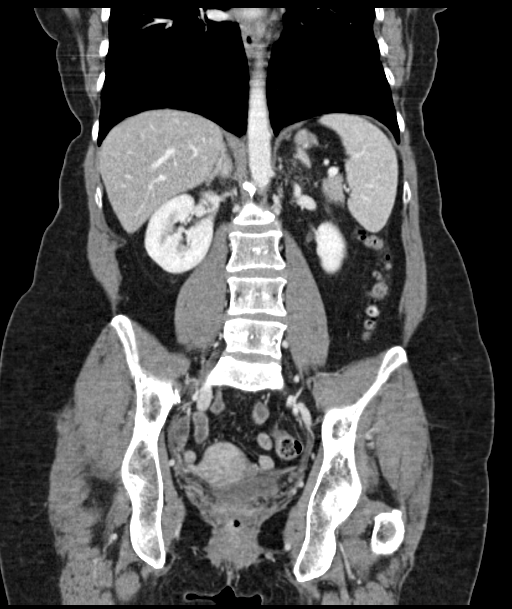

[16 of 46 positions shown; findings below may reference images not displayed]

FINDINGS: Lower chest: Small areas of consolidation within the lingula and
peripheral lateral basal segment of the left lower lobe.

Hepatobiliary: Subcentimeter low-density lesion in segment 8 is too
small to characterize, but statistically likely a cyst.
Cholelithiasis. No gallbladder wall thickening or biliary
dilatation.

Pancreas: Unremarkable. No pancreatic ductal dilatation or
surrounding inflammatory changes.

Spleen: Normal in size without focal abnormality.

Adrenals/Urinary Tract: The adrenal glands are unremarkable.
Punctate nonobstructive calculus in the lower pole of the left
kidney. No hydronephrosis. The bladder is unremarkable.

Stomach/Bowel: There is a focal area of wall thickening involving a
short segment of proximal transverse colon surrounding an inflamed
diverticulum, consistent with diverticulitis. Normal appendix. The
stomach and small bowel are unremarkable.

Vascular/Lymphatic: Aortic atherosclerosis. No enlarged abdominal or
pelvic lymph nodes.

Reproductive: Uterus and bilateral adnexa are unremarkable.

Other: Trace free fluid in the pelvis. No pneumoperitoneum or
drainable fluid collection.

Musculoskeletal: No acute or significant osseous findings.
IMPRESSION: 1. Findings consistent with acute diverticulitis of the proximal
transverse colon. No evidence of microperforation or drainable fluid
collection.
2. Small areas of consolidation within the lingula and peripheral
lateral basal segment of the left lower lobe, which could be
infectious or inflammatory in etiology.
3. Punctate nonobstructive left nephrolithiasis
4.  Aortic atherosclerosis ([id number]).

## 2018-09-03 ENCOUNTER — Other Ambulatory Visit: Payer: Self-pay | Admitting: Nurse Practitioner

## 2018-09-04 NOTE — Telephone Encounter (Signed)
Pt requesting refill

## 2018-11-03 ENCOUNTER — Other Ambulatory Visit: Payer: BC Managed Care – PPO

## 2018-11-04 ENCOUNTER — Other Ambulatory Visit: Payer: Self-pay | Admitting: Nurse Practitioner

## 2018-11-05 NOTE — Telephone Encounter (Signed)
Refill request

## 2018-11-27 ENCOUNTER — Telehealth: Payer: Self-pay

## 2018-11-27 NOTE — Telephone Encounter (Signed)
TC to Pt. To confirm her appointment date and time 01/22/19 for lab and Dr. Burr Medico  Pt inquired if she could get the Flu shot and pneumonia shot per Dr. Burr Medico OK to get both injections. Pt indecisive about if she should wait that long. Informed Pt. She should get Flu shot and decide if she wanted to wait to get pneumonia shot at her visit. Pt verbalized understanding stated she will  make a decision maybe before her visit

## 2018-12-04 ENCOUNTER — Ambulatory Visit
Admission: RE | Admit: 2018-12-04 | Discharge: 2018-12-04 | Disposition: A | Discharge status: Home / Self Care | Payer: BC Managed Care – PPO | Source: Ambulatory Visit | Attending: Hematology | Admitting: Hematology

## 2018-12-04 ENCOUNTER — Other Ambulatory Visit: Payer: Self-pay

## 2018-12-04 DIAGNOSIS — E2839 Other primary ovarian failure: Secondary | ICD-10-CM

## 2018-12-05 ENCOUNTER — Telehealth: Payer: Self-pay | Admitting: *Deleted

## 2018-12-05 NOTE — Telephone Encounter (Signed)
-----   Message from [name], MD sent at 12/04/2018  5:06 PM EDT ----- Please let pt know her DEXA result, osteopenia, slightly worse than before, continue calcium and vitD, and I will discuss medical treatment options with her on next visit. Thanks   [name]

## 2018-12-05 NOTE — Telephone Encounter (Signed)
Notified of message below. Verbalized understanding 

## 2018-12-23 ENCOUNTER — Other Ambulatory Visit: Payer: BC Managed Care – PPO

## 2019-01-20 ENCOUNTER — Telehealth: Payer: Self-pay | Admitting: Hematology

## 2019-01-20 NOTE — Telephone Encounter (Signed)
Called patient per providers request to convert 12/03 appointment to a virtual visit, left a voicemail.

## 2019-01-21 ENCOUNTER — Telehealth: Payer: Self-pay

## 2019-01-21 NOTE — Telephone Encounter (Signed)
Patient calls after receiving a voicemail about her appointment tomorrow being changed to a virtual visit.  At this time she would like to cancel her appointments tomorrow and reschedule for about 6 weeks out.  A scheduling message was sent.

## 2019-01-22 ENCOUNTER — Other Ambulatory Visit: Payer: BC Managed Care – PPO

## 2019-01-22 ENCOUNTER — Ambulatory Visit: Payer: BC Managed Care – PPO | Admitting: Hematology

## 2019-01-26 ENCOUNTER — Telehealth: Payer: Self-pay | Admitting: Hematology

## 2019-01-26 NOTE — Telephone Encounter (Signed)
Called patient to reschedule 12/03 appointment, left a voicemail.

## 2019-05-12 ENCOUNTER — Other Ambulatory Visit: Payer: Self-pay | Admitting: Hematology

## 2019-05-12 DIAGNOSIS — Z9889 Other specified postprocedural states: Secondary | ICD-10-CM

## 2019-05-20 ENCOUNTER — Telehealth: Payer: Self-pay | Admitting: Hematology

## 2019-05-20 ENCOUNTER — Telehealth: Payer: Self-pay

## 2019-05-20 NOTE — Telephone Encounter (Signed)
Sally Smith returned my call.  I let her know that Dr. Burr Medico does recommend the covid vaccine for her patients.  I sent a scheduling message for appts on 4/12/021 per pt request.

## 2019-05-20 NOTE — Telephone Encounter (Signed)
Scheduled appt per 3/31 sch msg  - unable to reach pt .left message with appt date and time

## 2019-05-20 NOTE — Telephone Encounter (Signed)
Ms Atherholt left message with [facility] regarding scheduling an appt and advice regarding receiving the covid vaccine.  I left vm at her home number to return my call

## 2019-05-28 NOTE — Progress Notes (Signed)
[facility]   [phone] Fax:([phone]) [phone]   Clinic Follow up Note   Patient Care Team: Sally Koch, MD as PCP - General (Obstetrics and Gynecology) Sally Merle, MD as Consulting Physician (Hematology) Sally Klein, MD as Consulting Physician (General Surgery) Sally Rudd, MD as Consulting Physician (Radiation Oncology) Sally Phlegm, NP as Nurse Practitioner (Hematology and Oncology)  Date of Service:  06/01/2019  CHIEF COMPLAINT: F/u of left breast cancer  SUMMARY OF ONCOLOGIC HISTORY: Oncology History Overview Note  Cancer Staging Malignant neoplasm of upper-inner quadrant of left breast in female, estrogen receptor positive ([place]) Staging form: Breast, AJCC 8th Edition - Clinical stage from 05/22/2016: Stage IA (cT1a, cN0, cM0, G2, ER: Positive, PR: Positive, HER2: Negative) - Signed by Sally Merle, MD on 05/29/2016     Malignant neoplasm of upper-inner quadrant of left breast in female, estrogen receptor positive ([place])  05/10/2016 Mammogram   Bilateral screening mammogram on 05/10/16 showed a possible distortion with calcifications in the left breast.   05/16/2016 Mammogram   Diagnostic mammogram and US showed Persistent distortion in the upper inner quadrant of the left breast possibly correlating with the sonographic area of acoustic shadowing, measuring about 41m. UKoreaof left axilla (-)   05/22/2016 Receptors her2   ER 100%, PR 70%+, Ki67 5%   05/22/2016 Initial Biopsy   Diagnosis Breast, left, needle core biopsy, upper inner - INVASIVE AND IN SITU LOBULAR CARCINOMA, G1-2   05/22/2016 Initial Diagnosis   Malignant neoplasm of upper-inner quadrant of left breast in female, estrogen receptor positive ([place]   05/31/2016 Genetic Testing   Genetic counseling and testing for hereditary cancer syndromes performed on 05/31/2016. Results are negative for pathogenic mutations in 46 genes analyzed by [facility]'s Common Hereditary Cancers Panel. Results  are dated 06/11/2016. Genes analyzed include: APC, ATM, AXIN2, BARD1, BMPR1A, BRCA1, BRCA2, BRIP1, CDH1, CDKN2A, CHEK2, CTNNA1, DICER1, EPCAM, GREM1, HOXB13, KIT, MEN1, MLH1, MSH2, MSH3, MSH6, MUTYH, NBN, NF1, NTHL1, PALB2, PDGFRA, PMS2, POLD1, POLE, PTEN, RAD50, RAD51C, RAD51D, SDHA, SDHB, SDHC, SDHD, SMAD4, SMARCA4, STK11, TP53, TSC1, TSC2, and VHL.    07/01/2016 Oncotype testing   Oncotype testing showed reoccurance score of 11. With 10 year risk of distant reoccurrence 7% with tamoxifen alone   07/04/2016 Surgery   Left breast lumpectomy with radioactive seed x2 and sentinel lymph node biopsy by Dr. BBarry Dienes  08/08/2016 - 09/24/2016 Radiation Therapy   Patient underwent radiation with Dr.Moody   09/2016 -  Anti-estrogen oral therapy   Letrozole 2.5 mg daily starting 09/2016   05/13/2017 Mammogram   Bilateral diagnostic mammogram and left ultrasonography  IMPRESSION:  1. No suspicious mammographic or sonographic abnormalities in the areas of patient's LEFT breast pain.  2. No mammographic evidence of breast malignancy.    10/14/2017 Imaging   10/14/2017 Breast MRI IMPRESSION: No MRI evidence of malignancy in either breast. Left lumpectomy changes. No adenopathy.      CURRENT THERAPY:  Letrozole 2.'5mg'$  once daily starting 09/2016  INTERVAL HISTORY:  PCarolin Quangis here for a follow up of left breast cancer. She was last seen by me 10 months ago. She presents to the clinic alone. She notes she is doing well. She notes her left chest pain is stable from surgery. She notices her pain the most with increased activity and lifting. She notes she still uses Tramadol as needed. She notes she is tolerating Letrozole well. She also notes hair thinning, mood swings and lower libido and vaginal dryness. She has  been taking Tums for calcium because calcium can lead to constipation. She notes her concerns of cancer recurrence. She was emotional about this today. She notes she has had her first  COVID19 vaccine.     REVIEW OF SYSTEMS:   Constitutional: Denies fevers, chills or abnormal weight loss Eyes: Denies blurriness of vision Ears, nose, mouth, throat, and face: Denies mucositis or sore throat Respiratory: Denies cough, dyspnea or wheezes Cardiovascular: Denies palpitation, chest discomfort or lower extremity swelling Gastrointestinal:  Denies nausea, heartburn or change in bowel habits Skin: Denies abnormal skin rashes Lymphatics: Denies new lymphadenopathy or easy bruising Neurological:Denies numbness, tingling or new weaknesses Behavioral/Psych: Mood is stable, no new changes  (+) emotional/anxious  All other systems were reviewed with the patient and are negative.  MEDICAL HISTORY:  Past Medical History:  Diagnosis Date  . Anxiety   . Breast cancer ([place]) 07/03/2016  . Cancer of left breast ([place])    [facility] genetic testing negative on 05/31/16  . Genetic testing 06/18/2016   [name] underwent genetic counseling and testing for hereditary cancer syndromes on 05/31/2016. Her results were negative for mutations in all 46 genes analyzed by [facility]'s 46-gene Common Hereditary Cancers Panel. Genes analyzed include: APC, ATM, AXIN2, BARD1, BMPR1A, BRCA1, BRCA2, BRIP1, CDH1, CDKN2A, CHEK2, CTNNA1, DICER1, EPCAM, GREM1, HOXB13, KIT, MEN1, MLH1, MSH2, MSH3, MSH6, MUTYH, NBN,  . Personal history of radiation therapy 2018   Left Breast Cancer  . Skin cancer    left breast     SURGICAL HISTORY: Past Surgical History:  Procedure Laterality Date  . BREAST LUMPECTOMY Left 07/03/2016  . BREAST LUMPECTOMY WITH RADIOACTIVE SEED AND SENTINEL LYMPH NODE BIOPSY Left 07/04/2016   Procedure: LEFT BREAST LUMPECTOMY WITH RADIOACTIVE SEED X 2 AND SENTINEL LYMPH NODE BIOPSY;  Surgeon: Sally Klein, MD;  Location: [facility];  Service: General;  Laterality: Left;  . surgery on right leg    . TONSILLECTOMY      I have reviewed the social history and family history with  the patient and they are unchanged from previous note.  ALLERGIES:  is allergic to bee venom; erythromycin base; latex; and sulfur.  MEDICATIONS:  Current Outpatient Medications  Medication Sig Dispense Refill  . acetaminophen (TYLENOL) 500 MG tablet Take 1,000 mg by mouth every 6 (six) hours as needed for mild pain, moderate pain, fever or headache.    . ALPRAZolam (XANAX) 0.25 MG tablet Take 0.25 mg by mouth daily as needed.    Marland Kitchen atorvastatin (LIPITOR) 20 MG tablet Take 20 mg by mouth daily.    Marland Kitchen buPROPion (WELLBUTRIN XL) 150 MG 24 hr tablet Take 1 tablet (150 mg total) by mouth daily. 30 tablet 2  . Calcium Carb-Cholecalciferol (CALCIUM-VITAMIN D) 500-200 MG-UNIT tablet Take 1 tablet by mouth daily.     . cholecalciferol (VITAMIN D) 1000 units tablet Take 1,000 Units by mouth daily.    Marland Kitchen docusate sodium (COLACE) 100 MG capsule Take 100 mg by mouth 2 (two) times a week.    Marland Kitchen EPINEPHrine 0.3 mg/0.3 mL IJ SOAJ injection Inject 0.3 mg into the muscle as needed (for allergic reaction).     . gabapentin (NEURONTIN) 100 MG capsule Take 1 capsule (100 mg total) by mouth 3 (three) times daily. 90 capsule 0  . hyaluronate sodium (RADIAPLEXRX) GEL Apply 1 application topically once as needed for up to 1 dose. 170 g 2  . HYDROcodone-acetaminophen (NORCO/VICODIN) 5-325 MG tablet Take 2 tablets by mouth every 4 (four) hours as  needed. (Patient not taking: Reported on 07/23/2018) 15 tablet 0  . ibuprofen (ADVIL,MOTRIN) 200 MG tablet Take 400 mg by mouth every 6 (six) hours as needed for fever, headache, mild pain, moderate pain or cramping.    Marland Kitchen letrozole (FEMARA) 2.5 MG tablet Take 1 tablet (2.5 mg total) by mouth daily. 90 tablet 3  . LORazepam (ATIVAN) 0.5 MG tablet Take 0.5-1 tablets (0.25-0.5 mg total) at bedtime as needed by mouth for anxiety. 1 tablet po 30 minutes prior to radiation and or qHS prn anxiety 30 tablet 2  . meclizine (DRAMAMINE II) 25 MG tablet Take 1 tablet (25 mg total) by mouth 3  (three) times daily as needed for dizziness. 20 tablet 0  . ondansetron (ZOFRAN-ODT) 4 MG disintegrating tablet Take 1 tablet (4 mg total) by mouth every 8 (eight) hours as needed for nausea. 10 tablet 0  . potassium chloride (KLOR-CON) 20 MEQ packet Take 20 mEq by mouth 2 (two) times daily. 7 packet 0  . traMADol (ULTRAM) 50 MG tablet TAKE 1 TABLET BY MOUTH EVERY 12 HOURS AS NEEDED. 20 tablet 0  . Travoprost, BAK Free, (TRAVATAN) 0.004 % SOLN ophthalmic solution Place 1 drop into both eyes at bedtime.     . tretinoin (RETIN-A) 0.025 % cream Apply small amount to face nightly    . VITAMIN E PO Take 1 tablet by mouth daily.    Marland Kitchen zolpidem (AMBIEN) 10 MG tablet Take 5 mg by mouth at bedtime as needed for sleep.     No current facility-administered medications for this visit.    PHYSICAL EXAMINATION: ECOG PERFORMANCE STATUS: 0 - Asymptomatic  Vitals:   06/01/19 1309  BP: 138/71  Pulse: 64  Resp: 18  Temp: 98.5 F (36.9 C)  SpO2: 100%   Filed Weights   06/01/19 1309  Weight: 134 lb (60.8 kg)   GENERAL:alert, no distress and comfortable SKIN: skin color, texture, turgor are normal, no rashes or significant lesions EYES: normal, Conjunctiva are pink and non-injected, sclera clear  NECK: supple, thyroid normal size, non-tender, without nodularity LYMPH:  no palpable lymphadenopathy in the cervical, axillary  LUNGS: clear to auscultation and percussion with normal breathing effort HEART: regular rate & rhythm and no murmurs and no lower extremity edema ABDOMEN:abdomen soft, non-tender and normal bowel sounds Musculoskeletal:no cyanosis of digits and no clubbing  NEURO: alert & oriented x 3 with fluent speech, no focal motor/sensory deficits BREAST: S/p left lumpectomy: Surgical incision healed well (+)tenderness of left chest wall and anterior 4th rib next to sternum (+) Left chest skin retraction and left ribcage protrusion. No palpable mass, nodules or adenopathy bilaterally. Breast  exam benign.    LABORATORY DATA:  I have reviewed the data as listed CBC Latest Ref Rng & Units 06/01/2019 07/23/2018 01/24/2018  WBC 4.0 - 10.5 K/uL 5.1 5.6 6.1  Hemoglobin 12.0 - 15.0 g/dL 12.5 12.4 13.7  Hematocrit 36.0 - 46.0 % 36.9 36.7 40.3  Platelets 150 - 400 K/uL 160 151 193     CMP Latest Ref Rng & Units 06/01/2019 07/23/2018 01/24/2018  Glucose 70 - 99 mg/dL 75 100(H) 92  BUN 6 - 20 mg/dL '10 12 8  '$ Creatinine 0.44 - 1.00 mg/dL 0.73 0.80 0.76  Sodium 135 - 145 mmol/L 139 137 139  Potassium 3.5 - 5.1 mmol/L 4.1 3.5 3.5  Chloride 98 - 111 mmol/L 103 102 99  CO2 22 - 32 mmol/L '30 27 29  '$ Calcium 8.9 - 10.3 mg/dL 9.5 9.6 9.6  Total Protein 6.5 - 8.1 g/dL 7.9 8.3(H) 8.4(H)  Total Bilirubin 0.3 - 1.2 mg/dL 0.4 0.5 0.6  Alkaline Phos 38 - 126 U/L 97 85 106  AST 15 - 41 U/L '26 18 21  '$ ALT 0 - 44 U/L 33 19 23      RADIOGRAPHIC STUDIES: I have personally reviewed the radiological images as listed and agreed with the findings in the report. No results found.   ASSESSMENT & PLAN:  Sally Smith is a 59 y.o. female with    1. 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 She is s/p left breast lumpectomy and radiation. She is currently onadjuvantLetrozole since 09/2016. Tolerating well, plan for 5-7 years and managing her mood swings, vaginal dryness, low libido and hair thinning.  -Her DEXA shows progressing osteopenia on AI. I discussed the option of starting bisphosphonate or switching AI to Tamoxifen if this continues to worsen. I reviewed Tamoxifen side effects such as increased risk of blood clots, increased risk of endometrial cancer, hot flashes and mood swings, etc. She will think about it.  -She is clinically doing well. Lab reviewed, her CBC and CMP are within normal limits except MCV 102.8, MCH 34.8. Her physical exam and her 07/2018 mammogram  were benign. There is no clinical concern for recurrence. -Continue surveillance. Next mammogram in 07/2019 -Continue Letrozole  -She notes her concerns about cancer recurrence. I reviewed her risk of her disease and her benefits from treatment. She voiced good understanding.  -Phone visit in 3 months and F/u in 6-7 months   2. Leftchest wall pain andtenderness -Shepreviously had left breast pain after surgery. She has developed left substernal chest pain after mild trama to left chest,10/2017 x-ray was negative for fractures, breast MRI was unremarkable except surgical changes -I discussed the tenderness is likely nerve pain from surgery and the prominence possibly from scar tissue. -She uses tramadol only as needed for pain, soreness. She understands not using frequently as she can become dependent. She also has Gabapentin.  -A bad trip and fall in 07/2018 exacerbated her pain.     3. Osteopenia  -Her 10/29/16 DEXA shows osteopenia with a left hip T-Score of -1.7. Her 11/2018 DEXA shows increased osteopenia with T-score -2.1 at right hip.  -I discussed the option of bisphosphonate to strengthen her bones. I recommend Zometa infusion q22month which can reduce her risk for breast cancer recurrence in the bone. I reviewed side effects including jaw necrosis. She should keep her dental cleaning up to date. She will think about it and is considering starting after her next Dental visit.  -I discussed if next DEXA shows worsening bone density, I will recommend intervention with bisphosphonate or switching AI to Tamoxifen.  We discussed the benefit and risks of biphosphonate and tamoxifen.  If she is agreeable to take a biphosphonate, I will not change letrozole to tamoxifen at this point. -She will continue Tums and vitamin D supplements.Will check VitD level every 6 months   4. Genetic Testing was negative   5. Glaucoma -Managed by her PCP. She takes Travatan drops.  6. Anxiety -She use  Ambien at night to help her sleep as needed  -Continue to f/u withPCP -She has been anxious about COVID19. She has received her first COVID19 vaccine  -She more lately has been anxious about her cancer recurring.   7. Macrocytosis without anemia  -  MCV has been slightly elevated, at 101.9 on 07/23/18 MCV at 102.8 today. B12 and folate level is still pending. (06/01/19)  8. Mood swings, vaginal dryness, low libido and hair thinning.  -secondary to Letrozole -for her low libido and mood swing, I suggested Wellbutrin.  Benefit and side effects discussed with her in detail.  She is willing to try.  -I also encouraged her to talk to her Gyn about other suggestions and advised her to avoid any estrogen containing products.    PLAN -I gave her reading material of Tamoxifen and Zometa, she will think about it  -I called Wellbutrin today  -Mammogram in 07/2019  -Continue Letrozole, refilled today.  -Phone visit in 3 months  -Lab and f/u and Zometa in 6-7 months   No problem-specific Assessment & Plan notes found for this encounter.   No orders of the defined types were placed in this encounter.  All questions were answered. The patient knows to call the clinic with any problems, questions or concerns. No barriers to learning was detected. The total time spent in the appointment was 40 minutes.     Sally Merle, MD 06/01/2019   I, Joslyn Devon, am acting as scribe for Sally Merle, MD.   I have reviewed the above documentation for accuracy and completeness, and I agree with the above.

## 2019-06-01 ENCOUNTER — Other Ambulatory Visit: Payer: Self-pay

## 2019-06-01 ENCOUNTER — Inpatient Hospital Stay: Payer: BC Managed Care – PPO

## 2019-06-01 ENCOUNTER — Inpatient Hospital Stay: Payer: BC Managed Care – PPO | Attending: Hematology | Admitting: Hematology

## 2019-06-01 ENCOUNTER — Encounter: Payer: Self-pay | Admitting: Hematology

## 2019-06-01 VITALS — BP 138/71 | HR 64 | Temp 98.5°F | Resp 18 | Ht 63.0 in | Wt 134.0 lb

## 2019-06-01 DIAGNOSIS — M8580 Other specified disorders of bone density and structure, unspecified site: Secondary | ICD-10-CM | POA: Insufficient documentation

## 2019-06-01 DIAGNOSIS — C50212 Malignant neoplasm of upper-inner quadrant of left female breast: Secondary | ICD-10-CM

## 2019-06-01 DIAGNOSIS — Z170 Estrogen receptor positive status [ER+]: Secondary | ICD-10-CM

## 2019-06-01 DIAGNOSIS — D7589 Other specified diseases of blood and blood-forming organs: Secondary | ICD-10-CM | POA: Insufficient documentation

## 2019-06-01 DIAGNOSIS — E2839 Other primary ovarian failure: Secondary | ICD-10-CM

## 2019-06-01 DIAGNOSIS — H409 Unspecified glaucoma: Secondary | ICD-10-CM | POA: Diagnosis not present

## 2019-06-01 DIAGNOSIS — F419 Anxiety disorder, unspecified: Secondary | ICD-10-CM | POA: Diagnosis not present

## 2019-06-01 DIAGNOSIS — Z79811 Long term (current) use of aromatase inhibitors: Secondary | ICD-10-CM | POA: Insufficient documentation

## 2019-06-01 DIAGNOSIS — R6882 Decreased libido: Secondary | ICD-10-CM | POA: Insufficient documentation

## 2019-06-01 LAB — VITAMIN B12: Vitamin B-12: 884 pg/mL (ref 180–914)

## 2019-06-01 LAB — COMPREHENSIVE METABOLIC PANEL
ALT: 33 U/L (ref 0–44)
AST: 26 U/L (ref 15–41)
Albumin: 4 g/dL (ref 3.5–5.0)
Alkaline Phosphatase: 97 U/L (ref 38–126)
Anion gap: 6 (ref 5–15)
BUN: 10 mg/dL (ref 6–20)
CO2: 30 mmol/L (ref 22–32)
Calcium: 9.5 mg/dL (ref 8.9–10.3)
Chloride: 103 mmol/L (ref 98–111)
Creatinine, Ser: 0.73 mg/dL (ref 0.44–1.00)
GFR calc Af Amer: >60 mL/min (ref >60)
GFR calc non Af Amer: >60 mL/min (ref >60)
Glucose, Bld: 75 mg/dL (ref 70–99)
Potassium: 4.1 mmol/L (ref 3.5–5.1)
Sodium: 139 mmol/L (ref 135–145)
Total Bilirubin: 0.4 mg/dL (ref 0.3–1.2)
Total Protein: 7.9 g/dL (ref 6.5–8.1)

## 2019-06-01 LAB — CBC WITH DIFFERENTIAL/PLATELET
Abs Immature Granulocytes: 0.03 10*3/uL (ref 0.00–0.07)
Basophils Absolute: 0.1 10*3/uL (ref 0.0–0.1)
Basophils Relative: 1 %
Eosinophils Absolute: 0.2 10*3/uL (ref 0.0–0.5)
Eosinophils Relative: 3 %
HCT: 36.9 % (ref 36.0–46.0)
Hemoglobin: 12.5 g/dL (ref 12.0–15.0)
Immature Granulocytes: 1 %
Lymphocytes Relative: 28 %
Lymphs Abs: 1.4 10*3/uL (ref 0.7–4.0)
MCH: 34.8 pg — ABNORMAL HIGH (ref 26.0–34.0)
MCHC: 33.9 g/dL (ref 30.0–36.0)
MCV: 102.8 fL — ABNORMAL HIGH (ref 80.0–100.0)
Monocytes Absolute: 0.5 10*3/uL (ref 0.1–1.0)
Monocytes Relative: 10 %
Neutro Abs: 2.9 10*3/uL (ref 1.7–7.7)
Neutrophils Relative %: 57 %
Platelets: 160 10*3/uL (ref 150–400)
RBC: 3.59 MIL/uL — ABNORMAL LOW (ref 3.87–5.11)
RDW: 12.1 % (ref 11.5–15.5)
WBC: 5.1 10*3/uL (ref 4.0–10.5)
nRBC: 0 % (ref 0.0–0.2)

## 2019-06-01 LAB — VITAMIN D 25 HYDROXY (VIT D DEFICIENCY, FRACTURES): Vit D, 25-Hydroxy: 78.87 ng/mL (ref 30–100)

## 2019-06-01 LAB — FOLATE: Folate: 21 ng/mL (ref >5.9)

## 2019-06-01 MED ORDER — LETROZOLE 2.5 MG PO TABS: 2.5000 mg | ORAL_TABLET | Freq: Every day | ORAL | 3 refills | Status: DC

## 2019-06-01 MED ORDER — BUPROPION HCL ER (XL) 150 MG PO TB24: 150.0000 mg | ORAL_TABLET | Freq: Every day | ORAL | 2 refills | Status: DC

## 2019-06-02 ENCOUNTER — Telehealth: Payer: Self-pay | Admitting: Hematology

## 2019-06-02 ENCOUNTER — Telehealth: Payer: Self-pay

## 2019-06-02 NOTE — Telephone Encounter (Signed)
I spoke with Sally Smith and let her know her labs are WNL.  She verbalized understanding.

## 2019-06-02 NOTE — Telephone Encounter (Signed)
Scheduled appt per 4/12 los.  Left a vm of the appt date and time.

## 2019-06-02 NOTE — Telephone Encounter (Signed)
-----   Message from [name], MD sent at 06/01/2019 11:18 PM EDT ----- Please let pt know her lab result, her VitD, B12 and folate levels were all WNL, not sure why her MCV is slightly above normal, I suggest her to avoid any alcohol. She has no anemia, will monitor clinically. No other concerns.   Thanks  [name]

## 2019-08-03 ENCOUNTER — Ambulatory Visit
Admission: RE | Admit: 2019-08-03 | Discharge: 2019-08-03 | Disposition: A | Discharge status: Home / Self Care | Payer: BC Managed Care – PPO | Source: Ambulatory Visit | Attending: Hematology | Admitting: Hematology

## 2019-08-03 ENCOUNTER — Other Ambulatory Visit: Payer: Self-pay

## 2019-08-03 DIAGNOSIS — Z9889 Other specified postprocedural states: Secondary | ICD-10-CM

## 2019-08-27 NOTE — Progress Notes (Signed)
[facility]   [phone] Fax:([phone]) [phone]   Clinic Follow up Note   Patient Care Team: Hoyt Koch, MD as PCP - General (Obstetrics and Gynecology) [name], MD as Consulting Physician (Hematology) Stark Klein, MD as Consulting Physician (General Surgery) Kyung Rudd, MD as Consulting Physician (Radiation Oncology) Delice Bison Charlestine Massed, NP as Nurse Practitioner (Hematology and Oncology)   I connected with Sharlee Blew on 08/31/2019 at  2:20 PM EDT by telephone visit and verified that I am speaking with the correct person using two identifiers.  I discussed the limitations, risks, security and privacy concerns of performing an evaluation and management service by telephone and the availability of in person appointments. I also discussed with the patient that there may be a patient responsible charge related to this service. The patient expressed understanding and agreed to proceed.   Other persons participating in the visit and their role in the encounter:  None   Patient's location:  Her home  Provider's location:  My office  CHIEF COMPLAINT: F/u of left breast cancer  SUMMARY OF ONCOLOGIC HISTORY: Oncology History Overview Note  Cancer Staging Malignant neoplasm of upper-inner quadrant of left breast in female, estrogen receptor positive ([place]) Staging form: Breast, AJCC 8th Edition - Clinical stage from 05/22/2016: Stage IA (cT1a, cN0, cM0, G2, ER: Positive, PR: Positive, HER2: Negative) - Signed by [name], MD on 05/29/2016     Malignant neoplasm of upper-inner quadrant of left breast in female, estrogen receptor positive ([place])  05/10/2016 Mammogram   Bilateral screening mammogram on 05/10/16 showed a possible distortion with calcifications in the left breast.   05/16/2016 Mammogram   Diagnostic mammogram and US showed Persistent distortion in the upper inner quadrant of the left breast possibly correlating with the sonographic area  of acoustic shadowing, measuring about 51m. UK[place]of left axilla (-)   05/22/2016 Receptors her2   ER 100%, PR 70%+, Ki67 5%   05/22/2016 Initial Biopsy   Diagnosis Breast, left, needle core biopsy, upper inner - INVASIVE AND IN SITU LOBULAR CARCINOMA, G1-2   05/22/2016 Initial Diagnosis   Malignant neoplasm of upper-inner quadrant of left breast in female, estrogen receptor positive ([place]   05/31/2016 Genetic Testing   Genetic counseling and testing for hereditary cancer syndromes performed on 05/31/2016. Results are negative for pathogenic mutations in 46 genes analyzed by [facility]'s Common Hereditary Cancers Panel. Results are dated 06/11/2016. Genes analyzed include: APC, ATM, AXIN2, BARD1, BMPR1A, BRCA1, BRCA2, BRIP1, CDH1, CDKN2A, CHEK2, CTNNA1, DICER1, EPCAM, GREM1, HOXB13, KIT, MEN1, MLH1, MSH2, MSH3, MSH6, MUTYH, NBN, NF1, NTHL1, PALB2, PDGFRA, PMS2, POLD1, POLE, PTEN, RAD50, RAD51C, RAD51D, SDHA, SDHB, SDHC, SDHD, SMAD4, SMARCA4, STK11, TP53, TSC1, TSC2, and VHL.    07/01/2016 Oncotype testing   Oncotype testing showed reoccurance score of 11. With 10 year risk of distant reoccurrence 7% with tamoxifen alone   07/04/2016 Surgery   Left breast lumpectomy with radioactive seed x2 and sentinel lymph node biopsy by Dr. BBarry Dienes  08/08/2016 - 09/24/2016 Radiation Therapy   Patient underwent radiation with Dr.Moody   09/2016 -  Anti-estrogen oral therapy   Letrozole 2.5 mg daily starting 09/2016   05/13/2017 Mammogram   Bilateral diagnostic mammogram and left ultrasonography  IMPRESSION:  1. No suspicious mammographic or sonographic abnormalities in the areas of patient's LEFT breast pain.  2. No mammographic evidence of breast malignancy.    10/14/2017 Imaging   10/14/2017 Breast MRI IMPRESSION: No MRI evidence of malignancy in either breast. Left lumpectomy  changes. No adenopathy.      CURRENT THERAPY:  Letrozole 2.'5mg'$  once daily starting 09/2016  INTERVAL HISTORY:  Sally Smith is here for a follow up of left breast cancer. She notes she is doing well. She has no new major changes. She was able to see Dentist and had no issues with her teeth. She notes she has been trying to figure out when she wants to start Zometa. She will continue to discuss with her husband given her daughter's wedding and activities are happening soon. She feels she is responding well to Wellbutrin. She notes she has vaginal dryness.    REVIEW OF SYSTEMS:   Constitutional: Denies fevers, chills or abnormal weight loss Eyes: Denies blurriness of vision Ears, nose, mouth, throat, and face: Denies mucositis or sore throat Respiratory: Denies cough, dyspnea or wheezes Cardiovascular: Denies palpitation, chest discomfort or lower extremity swelling Gastrointestinal:  Denies nausea, heartburn or change in bowel habits Skin: Denies abnormal skin rashes Lymphatics: Denies new lymphadenopathy or easy bruising Neurological:Denies numbness, tingling or new weaknesses Behavioral/Psych: Mood is stable, no new changes  All other systems were reviewed with the patient and are negative.  MEDICAL HISTORY:  Past Medical History:  Diagnosis Date  . Anxiety   . Breast cancer ([place]) 07/03/2016  . Cancer of left breast ([place])    [facility] genetic testing negative on 05/31/16  . Genetic testing 06/18/2016   Ms. Conrey underwent genetic counseling and testing for hereditary cancer syndromes on 05/31/2016. Her results were negative for mutations in all 46 genes analyzed by [facility]'s 46-gene Common Hereditary Cancers Panel. Genes analyzed include: APC, ATM, AXIN2, BARD1, BMPR1A, BRCA1, BRCA2, BRIP1, CDH1, CDKN2A, CHEK2, CTNNA1, DICER1, EPCAM, GREM1, HOXB13, KIT, MEN1, MLH1, MSH2, MSH3, MSH6, MUTYH, NBN,  . Personal history of radiation therapy 2018   Left Breast Cancer  . Skin cancer    left breast     SURGICAL HISTORY: Past Surgical History:  Procedure Laterality Date  . BREAST LUMPECTOMY Left 07/03/2016    . BREAST LUMPECTOMY WITH RADIOACTIVE SEED AND SENTINEL LYMPH NODE BIOPSY Left 07/04/2016   Procedure: LEFT BREAST LUMPECTOMY WITH RADIOACTIVE SEED X 2 AND SENTINEL LYMPH NODE BIOPSY;  Surgeon: Stark Klein, MD;  Location: [place];  Service: General;  Laterality: Left;  . surgery on right leg    . TONSILLECTOMY      I have reviewed the social history and family history with the patient and they are unchanged from previous note.  ALLERGIES:  is allergic to bee venom, erythromycin base, latex, and sulfur.  MEDICATIONS:  Current Outpatient Medications  Medication Sig Dispense Refill  . acetaminophen (TYLENOL) 500 MG tablet Take 1,000 mg by mouth every 6 (six) hours as needed for mild pain, moderate pain, fever or headache.    . ALPRAZolam (XANAX) 0.25 MG tablet Take 0.25 mg by mouth daily as needed.    Marland Kitchen atorvastatin (LIPITOR) 20 MG tablet Take 20 mg by mouth daily.    Marland Kitchen buPROPion (WELLBUTRIN XL) 150 MG 24 hr tablet Take 1 tablet (150 mg total) by mouth daily. 30 tablet 5  . Calcium Carb-Cholecalciferol (CALCIUM-VITAMIN D) 500-200 MG-UNIT tablet Take 1 tablet by mouth daily.     . cholecalciferol (VITAMIN D) 1000 units tablet Take 1,000 Units by mouth daily.    Marland Kitchen docusate sodium (COLACE) 100 MG capsule Take 100 mg by mouth 2 (two) times a week.    Marland Kitchen EPINEPHrine 0.3 mg/0.3 mL IJ SOAJ injection Inject 0.3 mg into the  muscle as needed (for allergic reaction).     . gabapentin (NEURONTIN) 100 MG capsule Take 1 capsule (100 mg total) by mouth 3 (three) times daily. 90 capsule 0  . hyaluronate sodium (RADIAPLEXRX) GEL Apply 1 application topically once as needed for up to 1 dose. 170 g 2  . HYDROcodone-acetaminophen (NORCO/VICODIN) 5-325 MG tablet Take 2 tablets by mouth every 4 (four) hours as needed. (Patient not taking: Reported on 07/23/2018) 15 tablet 0  . ibuprofen (ADVIL,MOTRIN) 200 MG tablet Take 400 mg by mouth every 6 (six) hours as needed for fever, headache, mild pain,  moderate pain or cramping.    Marland Kitchen letrozole (FEMARA) 2.5 MG tablet Take 1 tablet (2.5 mg total) by mouth daily. 90 tablet 3  . LORazepam (ATIVAN) 0.5 MG tablet Take 0.5-1 tablets (0.25-0.5 mg total) at bedtime as needed by mouth for anxiety. 1 tablet po 30 minutes prior to radiation and or qHS prn anxiety 30 tablet 2  . meclizine (DRAMAMINE II) 25 MG tablet Take 1 tablet (25 mg total) by mouth 3 (three) times daily as needed for dizziness. 20 tablet 0  . ondansetron (ZOFRAN-ODT) 4 MG disintegrating tablet Take 1 tablet (4 mg total) by mouth every 8 (eight) hours as needed for nausea. 10 tablet 0  . potassium chloride (KLOR-CON) 20 MEQ packet Take 20 mEq by mouth 2 (two) times daily. 7 packet 0  . traMADol (ULTRAM) 50 MG tablet TAKE 1 TABLET BY MOUTH EVERY 12 HOURS AS NEEDED. 20 tablet 0  . Travoprost, BAK Free, (TRAVATAN) 0.004 % SOLN ophthalmic solution Place 1 drop into both eyes at bedtime.     . tretinoin (RETIN-A) 0.025 % cream Apply small amount to face nightly    . VITAMIN E PO Take 1 tablet by mouth daily.    Marland Kitchen zolpidem (AMBIEN) 10 MG tablet Take 5 mg by mouth at bedtime as needed for sleep.     No current facility-administered medications for this visit.    PHYSICAL EXAMINATION: ECOG PERFORMANCE STATUS: 0 - Asymptomatic  No vitals taken today, Exam not performed today   LABORATORY DATA:  I have reviewed the data as listed CBC Latest Ref Rng & Units 06/01/2019 07/23/2018 01/24/2018  WBC 4.0 - 10.5 K/uL 5.1 5.6 6.1  Hemoglobin 12.0 - 15.0 g/dL 12.5 12.4 13.7  Hematocrit 36 - 46 % 36.9 36.7 40.3  Platelets 150 - 400 K/uL 160 151 193     CMP Latest Ref Rng & Units 06/01/2019 07/23/2018 01/24/2018  Glucose 70 - 99 mg/dL 75 100(H) 92  BUN 6 - 20 mg/dL _0 Creatinine 0.44 - 1.00 mg/dL 0.73 0.80 0.76  Sodium 135 - 145 mmol/L 139 137 139  Potassium 3.5 - 5.1 mmol/L 4.1 3.5 3.5  Chloride 98 - 111 mmol/L 103 102 99  CO2 22 - 32 mmol/L _1 Calcium 8.9 - 10.3 mg/dL 9.5 9.6 9.6    Total Protein 6.5 - 8.1 g/dL 7.9 8.3(H) 8.4(H)  Total Bilirubin 0.3 - 1.2 mg/dL 0.4 0.5 0.6  Alkaline Phos 38 - 126 U/L 97 85 106  AST 15 - 41 U/L _2 ALT 0 - 44 U/L 33 19 23      RADIOGRAPHIC STUDIES: I have personally reviewed the radiological images as listed and agreed with the findings in the report. No results found.   ASSESSMENT & PLAN:  Sally Smith is a 59 y.o. female with    1. 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 She is s/p left breast lumpectomy and radiation. She is currently onadjuvantLetrozole since 09/2016.Tolerating well,plan for5-7years and managing her mood swings, vaginal dryness, low libido, hair thinning and osteopenia.  -She is clinically doing well. Her 07/2019 mammogram was normal. There is no clinical concern for recurrence. -Continue surveillance. Next mammogram in 07/2020 -Continue Letrozole  -F/u in early 01/2020  2. Leftchest wall pain andtenderness  -Shepreviously had left breast pain after surgery. She has developed left substernal chest pain after mild trama to left chest,9/2019x-ray was negative for fractures, breast MRI was unremarkable except surgical changes -I discussed the tenderness is likely nerve pain from surgery and the prominence possibly from scar tissue. -She uses tramadol only as needed for pain, soreness.She understandsnot using frequently as she can become dependent. She also has Gabapentin.  -A bad trip and fall in 07/2018 exacerbated her pain.   -She notes she has had breast soreness after her 07/2019 mammogram.   3. Osteopenia  -Her 10/29/16 DEXA shows osteopenia with a left hip T-Score of -1.7. Her 11/2018 DEXA shows increased osteopenia with T-score -2.1 at right hip.  -I discussed the option of bisphosphonate to strengthen her bones. I recommended Zometa infusion  q71month which can reduce her risk for breast cancer recurrence in the bone. I have discussed side effects with her at length. She recently had a normal dental exam. She is interested in will discuses when to start with her husband.  -She will continue Tums and vitamin D supplements.Will check VitD level every 6 months   4. GeneticTesting was negative  5. Anxiety -She useAmbienat night to help her sleepas needed. Continue to f/u withPCP  6. Macrocytosiswithout anemia -MCV has been slightly elevated, at 101.9 on 07/23/18 and at 102.8 on 06/01/19. Her 06/01/19 B12 and folate levels were normal, will monitor.   7. Mood swings, vaginal dryness, low libido and hair thinning.  -secondary to Letrozole -for her low libido and mood swing, I previously started her on Wellbutrin (06/01/19). -I also encouraged her to talk to her Gyn about other suggestions and advised her to avoid any estrogen containing products. I suggest she use lubrication.  -Her mood and depression has improved on Wellbutrin.    PLAN -Continue Letrozole. -due to her daughter's wedding in Nov, we will postpone Lab, f/u and Zometa to early 01/2020   No problem-specific Assessment & Plan notes found for this encounter.   No orders of the defined types were placed in this encounter.  I discussed the assessment and treatment plan with the patient. The patient was provided an opportunity to ask questions and all were answered. The patient agreed with the plan and demonstrated an understanding of the instructions.  The patient was advised to call back or seek an in-person evaluation if the symptoms worsen or if the condition fails to improve as anticipated.  The total time spent in the appointment was 25 minutes.    YTruitt Merle MD 08/31/2019   I, AJoslyn Devon am acting as scribe for YTruitt Merle MD.   I have reviewed the above documentation for accuracy and completeness, and I agree with the above.

## 2019-08-31 ENCOUNTER — Inpatient Hospital Stay: Payer: BC Managed Care – PPO | Attending: Hematology | Admitting: Hematology

## 2019-08-31 ENCOUNTER — Encounter: Payer: Self-pay | Admitting: Hematology

## 2019-08-31 ENCOUNTER — Other Ambulatory Visit: Payer: Self-pay | Admitting: Hematology

## 2019-08-31 DIAGNOSIS — C50212 Malignant neoplasm of upper-inner quadrant of left female breast: Secondary | ICD-10-CM

## 2019-08-31 DIAGNOSIS — Z170 Estrogen receptor positive status [ER+]: Secondary | ICD-10-CM

## 2019-08-31 MED ORDER — BUPROPION HCL ER (XL) 150 MG PO TB24: 150.0000 mg | ORAL_TABLET | Freq: Every day | ORAL | 5 refills | Status: DC

## 2019-09-02 ENCOUNTER — Telehealth: Payer: Self-pay | Admitting: Hematology

## 2019-09-02 NOTE — Telephone Encounter (Signed)
Scheduled per 7/12 los. Pt requested to move appts to 12/1. Pt is aware of appt times and date.

## 2019-09-14 ENCOUNTER — Other Ambulatory Visit: Payer: Self-pay | Admitting: Hematology

## 2019-09-15 ENCOUNTER — Other Ambulatory Visit: Payer: Self-pay | Admitting: Hematology

## 2019-09-18 ENCOUNTER — Telehealth: Payer: Self-pay | Admitting: *Deleted

## 2019-09-18 ENCOUNTER — Other Ambulatory Visit: Payer: Self-pay | Admitting: Hematology

## 2019-09-18 MED ORDER — RADIAPLEXRX EX GEL: 1.0000 "application " | Freq: Once | CUTANEOUS | 2 refills | Status: DC | PRN

## 2019-09-18 NOTE — Telephone Encounter (Signed)
States her OB- Dr Enid Skeens will be retiring this year and will not refill her Ambien after August. She is wondering if Dr Burr Medico will fill this for her in the future.

## 2019-09-18 NOTE — Telephone Encounter (Signed)
I called pt and reviewed her medications. She is on Xanax and Ambien which have been prescribed by her GYN. I have prescribed tramadol prn and she only use it once a while for severe pain. I discussed all of these three medications cause sedation and some degree dependence, and are all controlled substance. This is best managed by one physician. I recommend her to discuss with her PCP Dr. Enid Skeens and let her manage it. She has an appointment with her in Oct 2021. I am OK to refill Lorrin Mais for Sep and Oct until she sees Dr. Enid Skeens. She agrees with the plan.    [name] MD

## 2019-10-01 ENCOUNTER — Telehealth: Payer: Self-pay | Admitting: Hematology

## 2019-10-01 NOTE — Telephone Encounter (Signed)
Release: [id number] Faxed medical records the [facility] @ fax [phone]

## 2019-12-07 ENCOUNTER — Other Ambulatory Visit: Payer: BC Managed Care – PPO

## 2019-12-07 ENCOUNTER — Ambulatory Visit: Payer: BC Managed Care – PPO

## 2019-12-07 ENCOUNTER — Ambulatory Visit: Payer: BC Managed Care – PPO | Admitting: Hematology

## 2020-01-18 NOTE — Progress Notes (Signed)
Sally Smith   [phone] [phone]   Clinic Follow up Note   Patient Care Team: Hoyt Koch, MD as PCP - General (Obstetrics and Gynecology) [name], MD as Consulting Physician (Hematology) Stark Klein, MD as Consulting Physician (General Surgery) Kyung Rudd, MD as Consulting Physician (Radiation Oncology) Gardenia Phlegm, NP as Nurse Practitioner (Hematology and Oncology)  Date of Service:  01/20/2020  CHIEF COMPLAINT: F/u of left breast cancer  SUMMARY OF ONCOLOGIC HISTORY: Oncology History Overview Note  Cancer Staging Malignant neoplasm of upper-inner quadrant of left breast in female, estrogen receptor positive (Sally Smith) Staging form: Breast, AJCC 8th Edition - Clinical stage from 05/22/2016: Stage IA (cT1a, cN0, cM0, G2, ER: Positive, PR: Positive, HER2: Negative) - Signed by [name], MD on 05/29/2016     Malignant neoplasm of upper-inner quadrant of left breast in female, estrogen receptor positive (Sally Smith)  05/10/2016 Mammogram   Bilateral screening mammogram on 05/10/16 showed a possible distortion with calcifications in the left breast.   05/16/2016 Mammogram   Diagnostic mammogram and US showed Persistent distortion in the upper inner quadrant of the left breast possibly correlating with the sonographic area of acoustic shadowing, measuring about 109m. UKoreaof left axilla (-)   05/22/2016 Receptors her2   ER 100%, PR 70%+, Ki67 5%   05/22/2016 Initial Biopsy   Diagnosis Breast, left, needle core biopsy, upper inner - INVASIVE AND IN SITU LOBULAR CARCINOMA, G1-2   05/22/2016 Initial Diagnosis   Malignant neoplasm of upper-inner quadrant of left breast in female, estrogen receptor positive (Sally Smith   05/31/2016 Genetic Testing   Genetic counseling and testing for hereditary cancer syndromes performed on 05/31/2016. Results are negative for pathogenic mutations in 46 genes analyzed by [facility]'s Common Hereditary Cancers Panel. Results  are dated 06/11/2016. Genes analyzed include: APC, ATM, AXIN2, BARD1, BMPR1A, BRCA1, BRCA2, BRIP1, CDH1, CDKN2A, CHEK2, CTNNA1, DICER1, EPCAM, GREM1, HOXB13, KIT, MEN1, MLH1, MSH2, MSH3, MSH6, MUTYH, NBN, NF1, NTHL1, PALB2, PDGFRA, PMS2, POLD1, POLE, PTEN, RAD50, RAD51C, RAD51D, SDHA, SDHB, SDHC, SDHD, SMAD4, SMARCA4, STK11, TP53, TSC1, TSC2, and VHL.    07/01/2016 Oncotype testing   Oncotype testing showed reoccurance score of 11. With 10 year risk of distant reoccurrence 7% with tamoxifen alone   07/04/2016 Surgery   Left breast lumpectomy with radioactive seed x2 and sentinel lymph node biopsy by Dr. BBarry Dienes  08/08/2016 - 09/24/2016 Radiation Therapy   Patient underwent radiation with Dr.Moody   09/2016 -  Anti-estrogen oral therapy   Letrozole 2.5 mg daily starting 09/2016   05/13/2017 Mammogram   Bilateral diagnostic mammogram and left ultrasonography  IMPRESSION:  1. No suspicious mammographic or sonographic abnormalities in the areas of patient's LEFT breast pain.  2. No mammographic evidence of breast malignancy.    10/14/2017 Imaging   10/14/2017 Breast MRI IMPRESSION: No MRI evidence of malignancy in either breast. Left lumpectomy changes. No adenopathy.      CURRENT THERAPY:  Letrozole 2.515monce daily starting 09/2016   INTERVAL HISTORY:  Sally Smith here for a follow up of left breast cancer. She was last seen by me 5 months ago. She presents to the clinic alone. She notes she is doing well. She is on Letrozole and tolerating well. Her hot flashes are minimal now and any joint pain is manageable. She notes her left chest pain is better. She is still able to do all activities. Pain mostly comes from too fast of movement or being too active. She uses Tramadol  as needed. She can go weeks without take it. She notes she has no dental issues. She notes she had a lot of death in family due to [place]. She is interested in [place] booster.    REVIEW OF SYSTEMS:     Constitutional: Denies fevers, chills or abnormal weight loss Eyes: Denies blurriness of vision Ears, nose, mouth, throat, and face: Denies mucositis or sore throat Respiratory: Denies cough, dyspnea or wheezes  Cardiovascular: Denies palpitation, chest discomfort or lower extremity swelling Gastrointestinal:  Denies nausea, heartburn or change in bowel habits Skin: Denies abnormal skin rashes Lymphatics: Denies new lymphadenopathy or easy bruising Neurological:Denies numbness, tingling or new weaknesses Behavioral/Psych: Mood is stable, no new changes  Breast: (+) Left chest wall pain much improved.  All other systems were reviewed with the patient and are negative.   MEDICAL HISTORY:  Past Medical History:  Diagnosis Date  . Anxiety   . Breast cancer ([place]) 07/03/2016  . Cancer of left breast ([place])    [facility] genetic testing negative on 05/31/16  . Genetic testing 06/18/2016   Ms. Gravelle underwent genetic counseling and testing for hereditary cancer syndromes on 05/31/2016. Her results were negative for mutations in all 46 genes analyzed by [facility]'s 46-gene Common Hereditary Cancers Panel. Genes analyzed include: APC, ATM, AXIN2, BARD1, BMPR1A, BRCA1, BRCA2, BRIP1, CDH1, CDKN2A, CHEK2, CTNNA1, DICER1, EPCAM, GREM1, HOXB13, KIT, MEN1, MLH1, MSH2, MSH3, MSH6, MUTYH, NBN,  . Personal history of radiation therapy 2018   Left Breast Cancer  . Skin cancer    left breast     SURGICAL HISTORY: Past Surgical History:  Procedure Laterality Date  . BREAST LUMPECTOMY Left 07/03/2016  . BREAST LUMPECTOMY WITH RADIOACTIVE SEED AND SENTINEL LYMPH NODE BIOPSY Left 07/04/2016   Procedure: LEFT BREAST LUMPECTOMY WITH RADIOACTIVE SEED X 2 AND SENTINEL LYMPH NODE BIOPSY;  Surgeon: Stark Klein, MD;  Location: [facility];  Service: General;  Laterality: Left;  . surgery on right leg    . TONSILLECTOMY      I have reviewed the social history and family history with the patient and  they are unchanged from previous note.  ALLERGIES:  is allergic to bee venom, erythromycin base, latex, and sulfur.  MEDICATIONS:  Current Outpatient Medications  Medication Sig Dispense Refill  . acetaminophen (TYLENOL) 500 MG tablet Take 1,000 mg by mouth every 6 (six) hours as needed for mild pain, moderate pain, fever or headache.    . ALPRAZolam (XANAX) 0.25 MG tablet Take 0.25 mg by mouth daily as needed.    Marland Kitchen atorvastatin (LIPITOR) 20 MG tablet Take 20 mg by mouth daily.    Marland Kitchen buPROPion (WELLBUTRIN XL) 150 MG 24 hr tablet Take 1 tablet (150 mg total) by mouth daily. 30 tablet 5  . Calcium Carb-Cholecalciferol (CALCIUM-VITAMIN D) 500-200 MG-UNIT tablet Take 1 tablet by mouth daily.     . cholecalciferol (VITAMIN D) 1000 units tablet Take 1,000 Units by mouth daily.    Marland Kitchen docusate sodium (COLACE) 100 MG capsule Take 100 mg by mouth 2 (two) times a week.    . gabapentin (NEURONTIN) 100 MG capsule Take 1 capsule (100 mg total) by mouth 3 (three) times daily. 90 capsule 0  . hyaluronate sodium (RADIAPLEXRX) GEL Apply 1 application topically once as needed for up to 1 dose. 170 g 2  . HYDROcodone-acetaminophen (NORCO/VICODIN) 5-325 MG tablet Take 2 tablets by mouth every 4 (four) hours as needed. (Patient not taking: Reported on 07/23/2018) 15 tablet 0  . ibuprofen (  ADVIL,MOTRIN) 200 MG tablet Take 400 mg by mouth every 6 (six) hours as needed for fever, headache, mild pain, moderate pain or cramping.    Marland Kitchen letrozole (FEMARA) 2.5 MG tablet Take 1 tablet (2.5 mg total) by mouth daily. 90 tablet 3  . LORazepam (ATIVAN) 0.5 MG tablet Take 0.5-1 tablets (0.25-0.5 mg total) at bedtime as needed by mouth for anxiety. 1 tablet po 30 minutes prior to radiation and or qHS prn anxiety 30 tablet 2  . meclizine (DRAMAMINE II) 25 MG tablet Take 1 tablet (25 mg total) by mouth 3 (three) times daily as needed for dizziness. 20 tablet 0  . potassium chloride (KLOR-CON) 20 MEQ packet Take 20 mEq by mouth 2 (two)  times daily. 7 packet 0  . traMADol (ULTRAM) 50 MG tablet Take 1 tablet (50 mg total) by mouth every 12 (twelve) hours as needed. 20 tablet 0  . Travoprost, BAK Free, (TRAVATAN) 0.004 % SOLN ophthalmic solution Place 1 drop into both eyes at bedtime.     . tretinoin (RETIN-A) 0.025 % cream Apply small amount to face nightly    . VITAMIN E PO Take 1 tablet by mouth daily.    Marland Kitchen zolpidem (AMBIEN) 10 MG tablet Take 5 mg by mouth at bedtime as needed for sleep.     No current facility-administered medications for this visit.    PHYSICAL EXAMINATION: ECOG PERFORMANCE STATUS: 0 - Asymptomatic  Vitals:   01/20/20 1054  BP: 136/71  Pulse: (!) 58  Resp: 18  Temp: (!) 97 F (36.1 C)  SpO2: 100%   Filed Weights   01/20/20 1054  Weight: 113 lb (51.3 kg)    GENERAL:alert, no distress and comfortable SKIN: skin color, texture, turgor are normal, no rashes or significant lesions EYES: normal, Conjunctiva are pink and non-injected, sclera clear  NECK: supple, thyroid normal size, non-tender, without nodularity LYMPH:  no palpable lymphadenopathy in the cervical, axillary  LUNGS: clear to auscultation and percussion with normal breathing effort HEART: regular rate & rhythm and no murmurs and no lower extremity edema ABDOMEN:abdomen soft, non-tender and normal bowel sounds Musculoskeletal:no cyanosis of digits and no clubbing  NEURO: alert & oriented x 3 with fluent speech, no focal motor/sensory deficits BREAST: s/p left lumpectomy: surgical incision healed well (+) Mild bony protrusion of left chest wall with soft nodule at 10:00 position of left breast. No palpable mass, nodules or adenopathy bilaterally. Breast exam benign.   LABORATORY DATA:  I have reviewed the data as listed CBC Latest Ref Rng & Units 01/20/2020 06/01/2019 07/23/2018  WBC 4.0 - 10.5 K/uL 6.2 5.1 5.6  Hemoglobin 12.0 - 15.0 g/dL 13.4 12.5 12.4  Hematocrit 36 - 46 % 38.3 36.9 36.7  Platelets 150 - 400 K/uL 177 160 151      CMP Latest Ref Rng & Units 01/20/2020 06/01/2019 07/23/2018  Glucose 70 - 99 mg/dL 84 75 100(H)  BUN 8 - 23 mg/dL _0 Creatinine 0.44 - 1.00 mg/dL 0.71 0.73 0.80  Sodium 135 - 145 mmol/L 140 139 137  Potassium 3.5 - 5.1 mmol/L 3.4(L) 4.1 3.5  Chloride 98 - 111 mmol/L 105 103 102  CO2 22 - 32 mmol/L _1 Calcium 8.9 - 10.3 mg/dL 10.0 9.5 9.6  Total Protein 6.5 - 8.1 g/dL 8.3(H) 7.9 8.3(H)  Total Bilirubin 0.3 - 1.2 mg/dL 0.5 0.4 0.5  Alkaline Phos 38 - 126 U/L 85 97 85  AST 15 - 41 U/L 23 26 18  ALT 0 - 44 U/L 20 33 19      RADIOGRAPHIC STUDIES: I have personally reviewed the radiological images as listed and agreed with the findings in the report. No results found.   ASSESSMENT & PLAN:  Leen Tworek is a 59 y.o. female with    1. 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 She is s/p left breast lumpectomy and radiation. She is currently onadjuvantLetrozole since 09/2016.Tolerating well,plan for5-7yearsand managing her mood swings, vaginal dryness, low libido, hair thinning and osteopenia. Manageable on Wellbutrin.  -She is clinically doing well. Lab reviewed, her CBC and CMP are within normal limits except K 3.4. Her physical exam and her 07/2019 mammogram were unremarkable. There is no clinical concern for recurrence. -Given her mild hypokalemia, I encouraged her to increase potassium rich foods in her diet -Continue surveillance. Next mammogram in 07/2020.  -Continue Letrozole -F/u in 6 months  -She is fine to proceed with COVID booster   2. Leftchest wall pain andtenderness  -Shepreviously had left breast pain after surgery. She has developed left substernal chest pain after mild trauma to left chest,9/2019x-ray was negative for fractures, breast MRIwas unremarkable exceptsurgical changes -She notes her left  chest wall pain has much improved and mainly present with very fast movements or over exertion. She takes Tramadol only as needed. I refilled today (01/20/20).   3. Osteopenia  -Her 10/29/16 DEXA shows osteopenia with a lefthipT-Score of -1.7. Her 11/2018 DEXA shows increased osteopenia with T-score -2.1 at right hip. -I started her on Zometa infusion today and continue q89month for 2 years. I encouraged her to maintain her dental health.  -She will continueTumsand vitamin D supplements.Willcheck VitD level every 6 months   4. GeneticTesting was negative  5. Anxiety -She useAmbienat night to help her sleepas needed. Continue to f/u withPCP -Mood much improved on Wellbutrin. Will continue.   6. Macrocytosiswithout anemia -MCV has been slightly elevated, at 101.9on6/3/20 and at 102.8 on 06/01/19. Her 06/01/19 B12 and folate levels were normal.  -Stable.   7. Mood swings, vaginal dryness, low libido and hair thinning, secondary to Letrozole -Mood much improved on Wellbutrin (06/01/19) -I also encouraged her to talk to her Gyn about other suggestions and advised her to avoid any estrogen containing products.I suggest she use lubrication.    PLAN -I refilled Tramadol today  -Proceed with Zometa today, continue every 6 months  -Continue Letrozole -Lab and F/u and Zometa in 6 months    No problem-specific Assessment & Plan notes found for this encounter.   No orders of the defined types were placed in this encounter.  All questions were answered. The patient knows to call the clinic with any problems, questions or concerns. No barriers to learning was detected. The total time spent in the appointment was 30 minutes.     YTruitt Merle MD 01/20/2020   I, AJoslyn Devon am acting as scribe for YTruitt Merle MD.   I have reviewed the above documentation for accuracy and completeness, and I agree with the above.

## 2020-01-20 ENCOUNTER — Other Ambulatory Visit: Payer: Self-pay

## 2020-01-20 ENCOUNTER — Inpatient Hospital Stay: Payer: BC Managed Care – PPO | Attending: Hematology

## 2020-01-20 ENCOUNTER — Inpatient Hospital Stay (HOSPITAL_BASED_OUTPATIENT_CLINIC_OR_DEPARTMENT_OTHER): Payer: BC Managed Care – PPO | Admitting: Hematology

## 2020-01-20 ENCOUNTER — Encounter: Payer: Self-pay | Admitting: Hematology

## 2020-01-20 ENCOUNTER — Inpatient Hospital Stay: Payer: BC Managed Care – PPO

## 2020-01-20 VITALS — BP 136/71 | HR 58 | Temp 97.0°F | Resp 18 | Ht 63.0 in | Wt 113.0 lb

## 2020-01-20 DIAGNOSIS — C50212 Malignant neoplasm of upper-inner quadrant of left female breast: Secondary | ICD-10-CM | POA: Insufficient documentation

## 2020-01-20 DIAGNOSIS — F419 Anxiety disorder, unspecified: Secondary | ICD-10-CM | POA: Diagnosis not present

## 2020-01-20 DIAGNOSIS — M85851 Other specified disorders of bone density and structure, right thigh: Secondary | ICD-10-CM | POA: Insufficient documentation

## 2020-01-20 DIAGNOSIS — Z170 Estrogen receptor positive status [ER+]: Secondary | ICD-10-CM

## 2020-01-20 DIAGNOSIS — Z79811 Long term (current) use of aromatase inhibitors: Secondary | ICD-10-CM | POA: Diagnosis not present

## 2020-01-20 DIAGNOSIS — R6882 Decreased libido: Secondary | ICD-10-CM | POA: Insufficient documentation

## 2020-01-20 DIAGNOSIS — E2839 Other primary ovarian failure: Secondary | ICD-10-CM

## 2020-01-20 LAB — CBC WITH DIFFERENTIAL/PLATELET
Abs Immature Granulocytes: 0.01 10*3/uL (ref 0.00–0.07)
Basophils Absolute: 0 10*3/uL (ref 0.0–0.1)
Basophils Relative: 1 %
Eosinophils Absolute: 0.1 10*3/uL (ref 0.0–0.5)
Eosinophils Relative: 2 %
HCT: 38.3 % (ref 36.0–46.0)
Hemoglobin: 13.4 g/dL (ref 12.0–15.0)
Immature Granulocytes: 0 %
Lymphocytes Relative: 34 %
Lymphs Abs: 2.1 10*3/uL (ref 0.7–4.0)
MCH: 35.4 pg — ABNORMAL HIGH (ref 26.0–34.0)
MCHC: 35 g/dL (ref 30.0–36.0)
MCV: 101.1 fL — ABNORMAL HIGH (ref 80.0–100.0)
Monocytes Absolute: 0.6 10*3/uL (ref 0.1–1.0)
Monocytes Relative: 9 %
Neutro Abs: 3.4 10*3/uL (ref 1.7–7.7)
Neutrophils Relative %: 54 %
Platelets: 177 10*3/uL (ref 150–400)
RBC: 3.79 MIL/uL — ABNORMAL LOW (ref 3.87–5.11)
RDW: 11.9 % (ref 11.5–15.5)
WBC: 6.2 10*3/uL (ref 4.0–10.5)
nRBC: 0 % (ref 0.0–0.2)

## 2020-01-20 LAB — COMPREHENSIVE METABOLIC PANEL
ALT: 20 U/L (ref 0–44)
AST: 23 U/L (ref 15–41)
Albumin: 4.3 g/dL (ref 3.5–5.0)
Alkaline Phosphatase: 85 U/L (ref 38–126)
Anion gap: 10 (ref 5–15)
BUN: 8 mg/dL (ref 8–23)
CO2: 25 mmol/L (ref 22–32)
Calcium: 10 mg/dL (ref 8.9–10.3)
Chloride: 105 mmol/L (ref 98–111)
Creatinine, Ser: 0.71 mg/dL (ref 0.44–1.00)
GFR, Estimated: >60 mL/min (ref >60)
Glucose, Bld: 84 mg/dL (ref 70–99)
Potassium: 3.4 mmol/L — ABNORMAL LOW (ref 3.5–5.1)
Sodium: 140 mmol/L (ref 135–145)
Total Bilirubin: 0.5 mg/dL (ref 0.3–1.2)
Total Protein: 8.3 g/dL — ABNORMAL HIGH (ref 6.5–8.1)

## 2020-01-20 LAB — VITAMIN D 25 HYDROXY (VIT D DEFICIENCY, FRACTURES): Vit D, 25-Hydroxy: 65.98 ng/mL (ref 30–100)

## 2020-01-20 MED ORDER — ZOLEDRONIC ACID 4 MG/100ML IV SOLN
INTRAVENOUS | Status: AC
  Filled 2020-01-20: qty 100

## 2020-01-20 MED ORDER — SODIUM CHLORIDE 0.9 % IV SOLN
Freq: Once | INTRAVENOUS | Status: AC
  Filled 2020-01-20: qty 250

## 2020-01-20 MED ORDER — TRAMADOL HCL 50 MG PO TABS: 50.0000 mg | ORAL_TABLET | Freq: Two times a day (BID) | ORAL | 0 refills | Status: DC | PRN

## 2020-01-20 MED ORDER — ZOLEDRONIC ACID 4 MG/5ML IV CONC
3.5000 mg | Freq: Once | INTRAVENOUS | Status: AC
  Administered 2020-01-20: 3.5 mg via INTRAVENOUS
  Filled 2020-01-20: qty 4.38

## 2020-01-20 NOTE — Patient Instructions (Signed)
Zoledronic Acid injection (Hypercalcemia, Oncology) What is this medicine? ZOLEDRONIC ACID (ZOE le dron ik AS id) lowers the amount of calcium loss from bone. It is used to treat too much calcium in your blood from cancer. It is also used to prevent complications of cancer that has spread to the bone. This medicine may be used for other purposes; ask your health care provider or pharmacist if you have questions. COMMON BRAND NAME(S): Zometa What should I tell my health care provider before I take this medicine? They need to know if you have any of these conditions:  aspirin-sensitive asthma  cancer, especially if you are receiving medicines used to treat cancer  dental disease or wear dentures  infection  kidney disease  receiving corticosteroids like dexamethasone or prednisone  an unusual or allergic reaction to zoledronic acid, other medicines, foods, dyes, or preservatives  pregnant or trying to get pregnant  breast-feeding How should I use this medicine? This medicine is for infusion into a vein. It is given by a health care professional in a hospital or clinic setting. Talk to your pediatrician regarding the use of this medicine in children. Special care may be needed. Overdosage: If you think you have taken too much of this medicine contact a poison control center or emergency room at once. NOTE: This medicine is only for you. Do not share this medicine with others. What if I miss a dose? It is important not to miss your dose. Call your doctor or health care professional if you are unable to keep an appointment. What may interact with this medicine?  certain antibiotics given by injection  NSAIDs, medicines for pain and inflammation, like ibuprofen or naproxen  some diuretics like bumetanide, furosemide  teriparatide  thalidomide This list may not describe all possible interactions. Give your health care provider a list of all the medicines, herbs, non-prescription  drugs, or dietary supplements you use. Also tell them if you smoke, drink alcohol, or use illegal drugs. Some items may interact with your medicine. What should I watch for while using this medicine? Visit your doctor or health care professional for regular checkups. It may be some time before you see the benefit from this medicine. Do not stop taking your medicine unless your doctor tells you to. Your doctor may order blood tests or other tests to see how you are doing. Women should inform their doctor if they wish to become pregnant or think they might be pregnant. There is a potential for serious side effects to an unborn child. Talk to your health care professional or pharmacist for more information. You should make sure that you get enough calcium and vitamin D while you are taking this medicine. Discuss the foods you eat and the vitamins you take with your health care professional. Some people who take this medicine have severe bone, joint, and/or muscle pain. This medicine may also increase your risk for jaw problems or a broken thigh bone. Tell your doctor right away if you have severe pain in your jaw, bones, joints, or muscles. Tell your doctor if you have any pain that does not go away or that gets worse. Tell your dentist and dental surgeon that you are taking this medicine. You should not have major dental surgery while on this medicine. See your dentist to have a dental exam and fix any dental problems before starting this medicine. Take good care of your teeth while on this medicine. Make sure you see your dentist for regular follow-up   appointments. What side effects may I notice from receiving this medicine? Side effects that you should report to your doctor or health care professional as soon as possible:  allergic reactions like skin rash, itching or hives, swelling of the face, lips, or tongue  anxiety, confusion, or depression  breathing problems  changes in vision  eye  pain  feeling faint or lightheaded, falls  jaw pain, especially after dental work  mouth sores  muscle cramps, stiffness, or weakness  redness, blistering, peeling or loosening of the skin, including inside the mouth  trouble passing urine or change in the amount of urine Side effects that usually do not require medical attention (report to your doctor or health care professional if they continue or are bothersome):  bone, joint, or muscle pain  constipation  diarrhea  fever  hair loss  irritation at site where injected  loss of appetite  nausea, vomiting  stomach upset  trouble sleeping  trouble swallowing  weak or tired This list may not describe all possible side effects. Call your doctor for medical advice about side effects. You may report side effects to FDA at 1-800-FDA-1088. Where should I keep my medicine? This drug is given in a hospital or clinic and will not be stored at home. NOTE: This sheet is a summary. It may not cover all possible information. If you have questions about this medicine, talk to your doctor, pharmacist, or health care provider.  2020 Elsevier/Gold Standard (2013-07-04 14:19:39)  

## 2020-01-21 ENCOUNTER — Telehealth: Payer: Self-pay | Admitting: Hematology

## 2020-01-21 NOTE — Telephone Encounter (Signed)
Scheduled appts per 12/1 los. Pt confirmed appt date and time.

## 2020-01-22 ENCOUNTER — Encounter: Payer: Self-pay | Admitting: Hematology

## 2020-02-29 ENCOUNTER — Other Ambulatory Visit: Payer: Self-pay | Admitting: Hematology

## 2020-04-05 ENCOUNTER — Telehealth: Payer: Self-pay

## 2020-04-05 NOTE — Telephone Encounter (Signed)
Sally Smith called stating the skin around her left breast incision has turned brown, is painfull and it appears there is a blister pimple or boil in the middle of the scar.  Reviewed with dr Burr Medico pt is to use antibiotic ointment on the area for one week.  If it doesn't improve she needs to be seen.  Sally Smith verbalized understanding.

## 2020-04-13 ENCOUNTER — Other Ambulatory Visit: Payer: Self-pay | Admitting: General Surgery

## 2020-04-13 DIAGNOSIS — R222 Localized swelling, mass and lump, trunk: Secondary | ICD-10-CM

## 2020-04-15 ENCOUNTER — Other Ambulatory Visit: Payer: Self-pay

## 2020-04-15 ENCOUNTER — Ambulatory Visit
Admission: RE | Admit: 2020-04-15 | Discharge: 2020-04-15 | Disposition: A | Discharge status: Home / Self Care | Payer: BC Managed Care – PPO | Source: Ambulatory Visit | Attending: General Surgery | Admitting: General Surgery

## 2020-04-15 DIAGNOSIS — R222 Localized swelling, mass and lump, trunk: Secondary | ICD-10-CM

## 2020-04-15 MED ORDER — GADOBUTROL 1 MMOL/ML IV SOLN
5.0000 mL | Freq: Once | INTRAVENOUS | Status: AC | PRN
  Administered 2020-04-15: 5 mL via INTRAVENOUS

## 2020-04-19 ENCOUNTER — Other Ambulatory Visit: Payer: Self-pay | Admitting: General Surgery

## 2020-04-19 DIAGNOSIS — R222 Localized swelling, mass and lump, trunk: Secondary | ICD-10-CM

## 2020-04-19 HISTORY — PX: BREAST EXCISIONAL BIOPSY: SUR124

## 2020-04-21 ENCOUNTER — Other Ambulatory Visit: Payer: Self-pay | Admitting: General Surgery

## 2020-04-21 ENCOUNTER — Encounter: Payer: Self-pay | Admitting: Gastroenterology

## 2020-04-21 DIAGNOSIS — R9389 Abnormal findings on diagnostic imaging of other specified body structures: Secondary | ICD-10-CM

## 2020-04-21 DIAGNOSIS — N63 Unspecified lump in unspecified breast: Secondary | ICD-10-CM

## 2020-05-04 ENCOUNTER — Telehealth: Payer: Self-pay

## 2020-05-04 MED ORDER — PREDNISONE 50 MG PO TABS: ORAL_TABLET | ORAL | 0 refills | Status: DC

## 2020-05-04 NOTE — Telephone Encounter (Signed)
Phone call to patient to review instructions for 13 hr prep for CT w/ contrast on 05/05/20  at 920 AM. I was unable to reach the patient but the husband was available and took instructions for patient. Pts spouse also verify which pharmacy to send the medications to and I also gave him my direct contact number if the patient were to have any questions.   Prescription called into [facility]. Pt spouse aware and verbalized understanding of instructions.  Pt to take 50 mg of prednisone on 05/04/20 at 820 PM, 50 mg of prednisone on 05/05/20 at 220 AM, and 50 mg of prednisone on 05/05/20 at 820 AM. Pt is also to take 50 mg of benadryl on 05/05/20 at 820 AM. Please call [phone] with any questions.

## 2020-05-05 ENCOUNTER — Ambulatory Visit
Admission: RE | Admit: 2020-05-05 | Discharge: 2020-05-05 | Disposition: A | Discharge status: Home / Self Care | Payer: BC Managed Care – PPO | Source: Ambulatory Visit | Attending: General Surgery | Admitting: General Surgery

## 2020-05-05 ENCOUNTER — Other Ambulatory Visit: Payer: Self-pay

## 2020-05-05 DIAGNOSIS — R9389 Abnormal findings on diagnostic imaging of other specified body structures: Secondary | ICD-10-CM

## 2020-05-05 DIAGNOSIS — R222 Localized swelling, mass and lump, trunk: Secondary | ICD-10-CM

## 2020-05-05 MED ORDER — IOPAMIDOL (ISOVUE-300) INJECTION 61%
75.0000 mL | Freq: Once | INTRAVENOUS | Status: AC | PRN
  Administered 2020-05-05: 75 mL via INTRAVENOUS

## 2020-05-11 ENCOUNTER — Other Ambulatory Visit: Payer: Self-pay | Admitting: General Surgery

## 2020-05-13 ENCOUNTER — Other Ambulatory Visit (HOSPITAL_BASED_OUTPATIENT_CLINIC_OR_DEPARTMENT_OTHER): Payer: BC Managed Care – PPO

## 2020-05-13 ENCOUNTER — Other Ambulatory Visit: Payer: Self-pay | Admitting: General Surgery

## 2020-05-13 ENCOUNTER — Other Ambulatory Visit: Payer: Self-pay

## 2020-05-13 ENCOUNTER — Encounter (HOSPITAL_BASED_OUTPATIENT_CLINIC_OR_DEPARTMENT_OTHER): Payer: Self-pay | Admitting: General Surgery

## 2020-05-13 ENCOUNTER — Other Ambulatory Visit (HOSPITAL_COMMUNITY)
Admission: RE | Admit: 2020-05-13 | Discharge: 2020-05-13 | Disposition: A | Discharge status: Home / Self Care | Payer: BC Managed Care – PPO | Source: Ambulatory Visit | Attending: General Surgery | Admitting: General Surgery

## 2020-05-13 DIAGNOSIS — Z01812 Encounter for preprocedural laboratory examination: Secondary | ICD-10-CM | POA: Insufficient documentation

## 2020-05-13 DIAGNOSIS — Z20822 Contact with and (suspected) exposure to covid-19: Secondary | ICD-10-CM | POA: Insufficient documentation

## 2020-05-13 LAB — SARS CORONAVIRUS 2 (TAT 6-24 HRS): SARS Coronavirus 2: NEGATIVE

## 2020-05-13 NOTE — Progress Notes (Signed)

## 2020-05-17 ENCOUNTER — Ambulatory Visit (HOSPITAL_BASED_OUTPATIENT_CLINIC_OR_DEPARTMENT_OTHER)
Admission: RE | Admit: 2020-05-17 | Discharge: 2020-05-17 | Disposition: A | Discharge status: Home / Self Care | Payer: BC Managed Care – PPO | Attending: General Surgery | Admitting: General Surgery

## 2020-05-17 ENCOUNTER — Ambulatory Visit (HOSPITAL_BASED_OUTPATIENT_CLINIC_OR_DEPARTMENT_OTHER): Payer: BC Managed Care – PPO | Admitting: Anesthesiology

## 2020-05-17 ENCOUNTER — Encounter (HOSPITAL_BASED_OUTPATIENT_CLINIC_OR_DEPARTMENT_OTHER)
Admission: RE | Disposition: A | Discharge status: Home / Self Care | Payer: Self-pay | Source: Home / Self Care | Attending: General Surgery

## 2020-05-17 ENCOUNTER — Other Ambulatory Visit: Payer: Self-pay

## 2020-05-17 ENCOUNTER — Encounter (HOSPITAL_BASED_OUTPATIENT_CLINIC_OR_DEPARTMENT_OTHER): Payer: Self-pay | Admitting: General Surgery

## 2020-05-17 DIAGNOSIS — Z882 Allergy status to sulfonamides status: Secondary | ICD-10-CM | POA: Insufficient documentation

## 2020-05-17 DIAGNOSIS — Z79811 Long term (current) use of aromatase inhibitors: Secondary | ICD-10-CM | POA: Diagnosis not present

## 2020-05-17 DIAGNOSIS — Z170 Estrogen receptor positive status [ER+]: Secondary | ICD-10-CM | POA: Insufficient documentation

## 2020-05-17 DIAGNOSIS — Z8541 Personal history of malignant neoplasm of cervix uteri: Secondary | ICD-10-CM | POA: Diagnosis not present

## 2020-05-17 DIAGNOSIS — R222 Localized swelling, mass and lump, trunk: Secondary | ICD-10-CM | POA: Diagnosis present

## 2020-05-17 DIAGNOSIS — Z9012 Acquired absence of left breast and nipple: Secondary | ICD-10-CM | POA: Diagnosis not present

## 2020-05-17 DIAGNOSIS — Z85828 Personal history of other malignant neoplasm of skin: Secondary | ICD-10-CM | POA: Diagnosis not present

## 2020-05-17 DIAGNOSIS — Z91040 Latex allergy status: Secondary | ICD-10-CM | POA: Diagnosis not present

## 2020-05-17 DIAGNOSIS — C50212 Malignant neoplasm of upper-inner quadrant of left female breast: Secondary | ICD-10-CM | POA: Insufficient documentation

## 2020-05-17 DIAGNOSIS — M7989 Other specified soft tissue disorders: Secondary | ICD-10-CM | POA: Insufficient documentation

## 2020-05-17 DIAGNOSIS — Z803 Family history of malignant neoplasm of breast: Secondary | ICD-10-CM | POA: Insufficient documentation

## 2020-05-17 HISTORY — PX: SKIN BIOPSY: SHX1

## 2020-05-17 HISTORY — DX: Other complications of anesthesia, initial encounter: T88.59XA

## 2020-05-17 HISTORY — DX: Diverticulitis of intestine, part unspecified, without perforation or abscess without bleeding: K57.92

## 2020-05-17 SURGERY — BIOPSY, SKIN
Anesthesia: General | Site: Chest | Laterality: Left

## 2020-05-17 MED ORDER — ONDANSETRON HCL 4 MG/2ML IJ SOLN
INTRAMUSCULAR | Status: DC | PRN
  Administered 2020-05-17: 4 mg via INTRAVENOUS

## 2020-05-17 MED ORDER — DEXAMETHASONE SODIUM PHOSPHATE 10 MG/ML IJ SOLN
INTRAMUSCULAR | Status: DC | PRN
  Administered 2020-05-17: 8 mg via INTRAVENOUS

## 2020-05-17 MED ORDER — ACETAMINOPHEN 500 MG PO TABS
1000.0000 mg | ORAL_TABLET | ORAL | Status: AC
  Administered 2020-05-17: 1000 mg via ORAL

## 2020-05-17 MED ORDER — LACTATED RINGERS IV SOLN: INTRAVENOUS | Status: DC | PRN

## 2020-05-17 MED ORDER — SCOPOLAMINE 1 MG/3DAYS TD PT72
MEDICATED_PATCH | TRANSDERMAL | Status: AC
  Filled 2020-05-17: qty 1

## 2020-05-17 MED ORDER — FENTANYL CITRATE (PF) 100 MCG/2ML IJ SOLN
INTRAMUSCULAR | Status: AC
  Filled 2020-05-17: qty 2

## 2020-05-17 MED ORDER — LIDOCAINE 1 % OPTIME INJ - NO CHARGE
INTRAMUSCULAR | Status: DC | PRN
  Administered 2020-05-17: 5 mL

## 2020-05-17 MED ORDER — ONDANSETRON HCL 4 MG/2ML IJ SOLN
INTRAMUSCULAR | Status: AC
  Filled 2020-05-17: qty 2

## 2020-05-17 MED ORDER — CEFAZOLIN SODIUM-DEXTROSE 2-4 GM/100ML-% IV SOLN
INTRAVENOUS | Status: AC
  Filled 2020-05-17: qty 100

## 2020-05-17 MED ORDER — MIDAZOLAM HCL 2 MG/2ML IJ SOLN
INTRAMUSCULAR | Status: AC
  Filled 2020-05-17: qty 2

## 2020-05-17 MED ORDER — ENSURE PRE-SURGERY PO LIQD: 296.0000 mL | Freq: Once | ORAL | Status: DC

## 2020-05-17 MED ORDER — GLYCOPYRROLATE 0.2 MG/ML IJ SOLN
INTRAMUSCULAR | Status: DC | PRN
  Administered 2020-05-17 (×2): .1 mg via INTRAVENOUS

## 2020-05-17 MED ORDER — DEXAMETHASONE SODIUM PHOSPHATE 10 MG/ML IJ SOLN
INTRAMUSCULAR | Status: AC
  Filled 2020-05-17: qty 1

## 2020-05-17 MED ORDER — SCOPOLAMINE 1 MG/3DAYS TD PT72
1.0000 | MEDICATED_PATCH | TRANSDERMAL | Status: DC
  Administered 2020-05-17: 1.5 mg via TRANSDERMAL

## 2020-05-17 MED ORDER — OXYCODONE HCL 5 MG PO TABS
ORAL_TABLET | ORAL | Status: AC
  Filled 2020-05-17: qty 1

## 2020-05-17 MED ORDER — ACETAMINOPHEN 500 MG PO TABS
ORAL_TABLET | ORAL | Status: AC
  Filled 2020-05-17: qty 2

## 2020-05-17 MED ORDER — TRAMADOL HCL 50 MG PO TABS: 50.0000 mg | ORAL_TABLET | Freq: Two times a day (BID) | ORAL | 1 refills | Status: DC | PRN

## 2020-05-17 MED ORDER — FENTANYL CITRATE (PF) 250 MCG/5ML IJ SOLN
INTRAMUSCULAR | Status: DC | PRN
  Administered 2020-05-17 (×2): 50 ug via INTRAVENOUS

## 2020-05-17 MED ORDER — GLYCOPYRROLATE PF 0.2 MG/ML IJ SOSY
PREFILLED_SYRINGE | INTRAMUSCULAR | Status: AC
  Filled 2020-05-17: qty 1

## 2020-05-17 MED ORDER — FENTANYL CITRATE (PF) 100 MCG/2ML IJ SOLN
25.0000 ug | INTRAMUSCULAR | Status: DC | PRN
  Administered 2020-05-17: 25 ug via INTRAVENOUS

## 2020-05-17 MED ORDER — PROPOFOL 10 MG/ML IV BOLUS
INTRAVENOUS | Status: DC | PRN
  Administered 2020-05-17: 200 mg via INTRAVENOUS

## 2020-05-17 MED ORDER — LACTATED RINGERS IV SOLN: INTRAVENOUS | Status: DC

## 2020-05-17 MED ORDER — CHLORHEXIDINE GLUCONATE CLOTH 2 % EX PADS: 6.0000 | MEDICATED_PAD | Freq: Once | CUTANEOUS | Status: DC

## 2020-05-17 MED ORDER — OXYCODONE HCL 5 MG/5ML PO SOLN: 5.0000 mg | Freq: Once | ORAL | Status: AC | PRN

## 2020-05-17 MED ORDER — ONDANSETRON HCL 4 MG/2ML IJ SOLN: 4.0000 mg | Freq: Four times a day (QID) | INTRAMUSCULAR | Status: DC | PRN

## 2020-05-17 MED ORDER — BUPIVACAINE-EPINEPHRINE (PF) 0.25% -1:200000 IJ SOLN
INTRAMUSCULAR | Status: DC | PRN
  Administered 2020-05-17: 5 mL via PERINEURAL

## 2020-05-17 MED ORDER — CEFAZOLIN SODIUM-DEXTROSE 2-4 GM/100ML-% IV SOLN
2.0000 g | INTRAVENOUS | Status: AC
  Administered 2020-05-17: 2 g via INTRAVENOUS

## 2020-05-17 MED ORDER — MIDAZOLAM HCL 2 MG/2ML IJ SOLN
INTRAMUSCULAR | Status: DC | PRN
  Administered 2020-05-17: 2 mg via INTRAVENOUS

## 2020-05-17 MED ORDER — LIDOCAINE HCL (CARDIAC) PF 100 MG/5ML IV SOSY
PREFILLED_SYRINGE | INTRAVENOUS | Status: DC | PRN
  Administered 2020-05-17: 60 mg via INTRATRACHEAL

## 2020-05-17 MED ORDER — OXYCODONE HCL 5 MG PO TABS
5.0000 mg | ORAL_TABLET | Freq: Once | ORAL | Status: AC | PRN
  Administered 2020-05-17: 5 mg via ORAL

## 2020-05-17 SURGICAL SUPPLY — 51 items
ADH SKN CLS APL DERMABOND .7 (GAUZE/BANDAGES/DRESSINGS) ×1
APL PRP STRL LF DISP 70% ISPRP (MISCELLANEOUS) ×1
BINDER BREAST MEDIUM (GAUZE/BANDAGES/DRESSINGS) ×2 IMPLANT
BLADE HEX COATED 2.75 (ELECTRODE) IMPLANT
BLADE SURG 10 STRL SS (BLADE) ×2 IMPLANT
BLADE SURG 15 STRL LF DISP TIS (BLADE) ×1 IMPLANT
BLADE SURG 15 STRL SS (BLADE) ×2
CANISTER SUCT 1200ML W/VALVE (MISCELLANEOUS) IMPLANT
CHLORAPREP W/TINT 26 (MISCELLANEOUS) ×2 IMPLANT
COVER BACK TABLE 60X90IN (DRAPES) ×2 IMPLANT
COVER MAYO STAND STRL (DRAPES) ×2 IMPLANT
COVER WAND RF STERILE (DRAPES) IMPLANT
DECANTER SPIKE VIAL GLASS SM (MISCELLANEOUS) IMPLANT
DERMABOND ADVANCED (GAUZE/BANDAGES/DRESSINGS) ×1
DERMABOND ADVANCED .7 DNX12 (GAUZE/BANDAGES/DRESSINGS) ×1 IMPLANT
DRAPE LAPAROSCOPIC ABDOMINAL (DRAPES) IMPLANT
DRAPE LAPAROTOMY 100X72 PEDS (DRAPES) ×2 IMPLANT
DRAPE UTILITY XL STRL (DRAPES) ×2 IMPLANT
DRSG PAD ABDOMINAL 8X10 ST (GAUZE/BANDAGES/DRESSINGS) IMPLANT
ELECT REM PT RETURN 9FT ADLT (ELECTROSURGICAL) ×2
ELECTRODE REM PT RTRN 9FT ADLT (ELECTROSURGICAL) ×1 IMPLANT
GAUZE SPONGE 4X4 12PLY STRL LF (GAUZE/BANDAGES/DRESSINGS) ×2 IMPLANT
GLOVE SURG ENC MOIS LTX SZ6 (GLOVE) IMPLANT
GLOVE SURG POLYISO LF SZ6.5 (GLOVE) ×2 IMPLANT
GLOVE SURG UNDER POLY LF SZ6.5 (GLOVE) ×2 IMPLANT
GOWN STRL REUS W/ TWL LRG LVL3 (GOWN DISPOSABLE) ×1 IMPLANT
GOWN STRL REUS W/TWL 2XL LVL3 (GOWN DISPOSABLE) ×2 IMPLANT
GOWN STRL REUS W/TWL LRG LVL3 (GOWN DISPOSABLE) ×2
NEEDLE HYPO 25X1 1.5 SAFETY (NEEDLE) ×2 IMPLANT
NS IRRIG 1000ML POUR BTL (IV SOLUTION) ×2 IMPLANT
PACK BASIN DAY SURGERY FS (CUSTOM PROCEDURE TRAY) ×2 IMPLANT
PACK UNIVERSAL I (CUSTOM PROCEDURE TRAY) IMPLANT
PENCIL SMOKE EVACUATOR (MISCELLANEOUS) ×2 IMPLANT
SLEEVE SCD COMPRESS KNEE MED (STOCKING) ×2 IMPLANT
SPONGE LAP 18X18 RF (DISPOSABLE) ×2 IMPLANT
STAPLER VISISTAT 35W (STAPLE) IMPLANT
STRIP CLOSURE SKIN 1/2X4 (GAUZE/BANDAGES/DRESSINGS) ×2 IMPLANT
SUT MNCRL AB 4-0 PS2 18 (SUTURE) IMPLANT
SUT MON AB 3-0 SH 27 (SUTURE) ×4
SUT MON AB 3-0 SH27 (SUTURE) ×2 IMPLANT
SUT SILK 3 0 TIES 17X18 (SUTURE)
SUT SILK 3-0 18XBRD TIE BLK (SUTURE) IMPLANT
SUT VIC AB 2-0 SH 27 (SUTURE)
SUT VIC AB 2-0 SH 27XBRD (SUTURE) IMPLANT
SUT VIC AB 3-0 SH 27 (SUTURE) ×2
SUT VIC AB 3-0 SH 27X BRD (SUTURE) ×1 IMPLANT
SYR BULB EAR ULCER 3OZ GRN STR (SYRINGE) ×2 IMPLANT
SYR CONTROL 10ML LL (SYRINGE) ×2 IMPLANT
TOWEL GREEN STERILE FF (TOWEL DISPOSABLE) ×2 IMPLANT
TUBE CONNECTING 20X1/4 (TUBING) IMPLANT
YANKAUER SUCT BULB TIP NO VENT (SUCTIONS) IMPLANT

## 2020-05-17 NOTE — Anesthesia Procedure Notes (Signed)
Procedure Name: LMA Insertion Date/Time: 05/17/2020 11:59 AM Performed by: Kathryne Hitch, CRNA Pre-anesthesia Checklist: Patient identified, Emergency Drugs available, Suction available and Patient being monitored Patient Re-evaluated:Patient Re-evaluated prior to induction Oxygen Delivery Method: Circle system utilized Preoxygenation: Pre-oxygenation with 100% oxygen Induction Type: IV induction Ventilation: Mask ventilation without difficulty LMA: LMA inserted LMA Size: 4.0 Number of attempts: 1 Placement Confirmation: positive ETCO2 and breath sounds checked- equal and bilateral Tube secured with: Tape Dental Injury: Teeth and Oropharynx as per pre-operative assessment

## 2020-05-17 NOTE — Transfer of Care (Signed)
Immediate Anesthesia Transfer of Care Note  Patient: Sally Smith  Procedure(s) Performed: INCISIONAL BIOPSY LEFT  CHEST WALL MASS (Left Chest)  Patient Location: PACU  Anesthesia Type:General  Level of Consciousness: drowsy and patient cooperative  Airway & Oxygen Therapy: Patient Spontanous Breathing and Patient connected to face mask oxygen  Post-op Assessment: Report given to RN and Post -op Vital signs reviewed and stable  Post vital signs: Reviewed and stable  Last Vitals:  Vitals Value Taken Time  BP 111/66   Temp    Pulse 93 05/17/20 1234  Resp 15 05/17/20 1234  SpO2 100 % 05/17/20 1234  Vitals shown include unvalidated device data.  Last Pain:  Vitals:   05/17/20 1007  TempSrc: Oral  PainSc: 0-No pain         Complications: No complications documented.

## 2020-05-17 NOTE — Anesthesia Preprocedure Evaluation (Signed)
Anesthesia Evaluation  Patient identified by MRN, date of birth, ID band Patient awake    Reviewed: Allergy & Precautions, H&P , NPO status , Patient's Chart, lab work & pertinent test results  Airway Mallampati: II   Neck ROM: full    Dental   Pulmonary neg pulmonary ROS,    breath sounds clear to auscultation       Cardiovascular negative cardio ROS   Rhythm:regular Rate:Normal     Neuro/Psych PSYCHIATRIC DISORDERS Anxiety    GI/Hepatic   Endo/Other    Renal/GU      Musculoskeletal   Abdominal   Peds  Hematology   Anesthesia Other Findings   Reproductive/Obstetrics Breast CA                             Anesthesia Physical Anesthesia Plan  ASA: II  Anesthesia Plan: General   Post-op Pain Management:    Induction: Intravenous  PONV Risk Score and Plan: 3 and Ondansetron, Dexamethasone and Treatment may vary due to age or medical condition  Airway Management Planned: LMA  Additional Equipment:   Intra-op Plan:   Post-operative Plan: Extubation in OR  Informed Consent: I have reviewed the patients History and Physical, chart, labs and discussed the procedure including the risks, benefits and alternatives for the proposed anesthesia with the patient or authorized representative who has indicated his/her understanding and acceptance.     Dental advisory given  Plan Discussed with: CRNA, Anesthesiologist and Surgeon  Anesthesia Plan Comments:         Anesthesia Quick Evaluation

## 2020-05-17 NOTE — Discharge Instructions (Addendum)
[place] Office Phone Number [phone]   POST OP INSTRUCTIONS  Always review your discharge instruction sheet given to you by the facility where your surgery was performed.  IF YOU HAVE DISABILITY OR FAMILY LEAVE FORMS, YOU MUST BRING THEM TO THE OFFICE FOR PROCESSING.  DO NOT GIVE THEM TO YOUR DOCTOR.  1. A prescription for pain medication may be given to you upon discharge.  Take your pain medication as prescribed, if needed.  If narcotic pain medicine is not needed, then you may take acetaminophen (Tylenol) or ibuprofen (Advil) as needed. 2. Take your usually prescribed medications unless otherwise directed 3. If you need a refill on your pain medication, please contact your pharmacy.  They will contact our office to request authorization.  Prescriptions will not be filled after 5pm or on week-ends. 4. You should eat very light the first 24 hours after surgery, such as soup, crackers, pudding, etc.  Resume your normal diet the day after surgery 5. It is common to experience some constipation if taking pain medication after surgery.  Increasing fluid intake and taking a stool softener will usually help or prevent this problem from occurring.  A mild laxative (Milk of Magnesia or Miralax) should be taken according to package directions if there are no bowel movements after 48 hours. 6. You may shower in 48 hours.  The surgical glue will flake off in 2-3 weeks.   7. ACTIVITIES:  No strenuous activity or heavy lifting for 1 week.   a. You may drive when you no longer are taking prescription pain medication, you can comfortably wear a seatbelt, and you can safely maneuver your car and apply brakes. b. RETURN TO WORK:  _____to be determined, but Ok if no strenuous activity and no lifting.   You should see your doctor in the office for a follow-up appointment approximately three-four weeks after your surgery.    WHEN TO CALL YOUR DOCTOR: 1. Fever over 101.0 2. Nausea and/or  vomiting. 3. Extreme swelling or bruising. 4. Continued bleeding from incision. 5. Increased pain, redness, or drainage from the incision.  The clinic staff is available to answer your questions during regular business hours.  Please don't hesitate to call and ask to speak to one of the nurses for clinical concerns.  If you have a medical emergency, go to the nearest emergency room or call 911.  A surgeon from [facility] is always on call at the hospital.  For further questions, please visit [email or web address]   May take Tylenol after 4pm, if needed.    Post Anesthesia Home Care Instructions  Activity: Get plenty of rest for the remainder of the day. A responsible individual must stay with you for 24 hours following the procedure.  For the next 24 hours, DO NOT: -Drive a car -Paediatric nurse -Drink alcoholic beverages -Take any medication unless instructed by your physician -Make any legal decisions or sign important papers.  Meals: Start with liquid foods such as gelatin or soup. Progress to regular foods as tolerated. Avoid greasy, spicy, heavy foods. If nausea and/or vomiting occur, drink only clear liquids until the nausea and/or vomiting subsides. Call your physician if vomiting continues.  Special Instructions/Symptoms: Your throat may feel dry or sore from the anesthesia or the breathing tube placed in your throat during surgery. If this causes discomfort, gargle with warm salt water. The discomfort should disappear within 24 hours.  If you had a scopolamine patch placed behind your ear for the management of  post- operative nausea and/or vomiting:  1. The medication in the patch is effective for 72 hours, after which it should be removed.  Wrap patch in a tissue and discard in the trash. Wash hands thoroughly with soap and water. 2. You may remove the patch earlier than 72 hours if you experience unpleasant side effects which may include dry mouth,  dizziness or visual disturbances. 3. Avoid touching the patch. Wash your hands with soap and water after contact with the patch.

## 2020-05-17 NOTE — Op Note (Signed)
PRE-OPERATIVE DIAGNOSIS: painful skin lesion left upper chest, history of left breast cancer  POST-OPERATIVE DIAGNOSIS:  Same  PROCEDURE:  Procedure(s): Excision of left chest wall mass  SURGEON:  Surgeon(s): Stark Klein, MD  ANESTHESIA:   local and general  DRAINS: none   LOCAL MEDICATIONS USED:  BUPIVICAINE  and LIDOCAINE   SPECIMEN:  Source of Specimen:  left chest wall mass  DISPOSITION OF SPECIMEN:  pathology and microbiology  COUNTS:  YES  DICTATION: .Dragon Dictation  PLAN OF CARE: Discharge to home after PACU  PATIENT DISPOSITION:  PACU - hemodynamically stable.  FINDINGS:  Firm skin mass that was right on the rib, but not attached to the rib  EBL: min  PROCEDURE:  Patient was identified in the holding area and taken to the operating room where she was placed supine on the operating room table.  Sequential compression devices were placed on her legs.  General anesthesia was induced.  Her left chest was prepped and draped in sterile fashion.  A timeout was performed according to the surgical safety checklist.  When all was correct, we continued.  Local anesthetic was infiltrated around the mass. The principal site of the lesion was identified and a triangular portion that incorporated the bulk of the mass was excised with a 15 blade and the cautery.  Cautery was used to take the skin mass directly off the chest wall. The defect was 2 x 2 cm.   There was minimal pectoralis in this location.  There is also some skin damage presumably from radiation.  The skin was then freed up in all directions.  Once the skin had been freed up approximately 1 inch in all directions, this was closed starting at each corner with interrupted 3-0 Monocryl sutures.  The triangular defect became smaller and smaller.  The central portion was pulled together with a suture on all 3 sides to close down the center.  The wound was then cleaned, dried, and dressed with Xeroform, gauze, ABD, and  breast binder.  The patient was allowed to emerge from anesthesia and taken to the PACU in stable condition.  Needle, sponge, and instrument counts are correct x2

## 2020-05-17 NOTE — H&P (Signed)
Ronny Flurry Appointment: 05/13/2020 11:00 AM Location: [facility] Patient #: [id number] DOB: 05-10-1960 Married / Language: Cleophus Molt / Race: White Female   History of Present Illness Stark Klein MD; 05/13/2020 2:41 PM) The patient is a 60 year old female who presents for a follow-up for Breast cancer. Patient is a 60 year old female referred by Dr. Shon Hale with diagnosis of left breast cancer 05/2016. The patient was found to have screening detected architectural distortion. Diagnostic mammograms were performed showing a 6 mm area of concern at 11:30 in the left breast. A core needle biopsy was performed showing grade 1-2 invasive mammary carcinoma that is lobular phenotype. This is ER/PR positive and HER-2 negative. Ki-67 is 5%. The patient had a family history of breast cancer in her mother at age 46. She states that this was originally a left-sided breast cancer and she had a mastectomy. 13 months later, she was diagnosed with stage IV breast cancer on the right. She died from her disease. The patient has had skin cancer and cervical cancer. Skin cancer was [facility] at [facility] and cervical cancer was treated at [facility]. 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  Of note, the patient does pass out almost 100% of the time that she gets blood draw, and IV, or a biopsy. She does better if she drinks water and lies down prior to being stuck. She does note that she generally needs more pain medication than normal. She is a redhead.  Based on the lobular nature of her tumor, an MRI was performed. This demonstrated a 3 cm mass as opposed to 8 mm mass seen on mammogram and ultrasound. The posterior extent of the was biopsied and was also invasive mammary carcinoma, lobular phenotype.   She subsequently had left lumpectomy and sentinel lymph node biopsy 07/04/2016. Margins were focally positive, but these areas reflect muscle and skin. There is no residual breast tissue  there. Her oncotype was low. She had adjuvant XRT with Dr. Lisbeth Renshaw 6/20-09/23/2016.  I hadn't seen her since for several years. At her visit in 2019, she had a small rubbery nodule in the upper chest wall, but on MRI it appeared to be fat necrosis. She noted a new nodule on the upper left chest wall around december 2021. It became raised and sore. It was originally felt to represent infection.   I got repeat imaging. MRI was done and the skin/tissues in the upper inner left breast were felt to represent inflammation or infection. Several lung nodules were also seen. Follow up studies (chest CT negative for lung cancer) and u/s left breast. The ultrasound showed a mass like area with clips.    Pathology Diagnosis 1. Breast, lumpectomy, Left - INVASIVE AND IN SITU LOBULAR CARCINOMA, 2.8 CM. - INVASIVE CARCINOMA FOCALLY INVOLVES POSTERIOR AND ANTERIOR MARGINS. - LOBULAR CARCINOMA IN SITU FOCALLY LESS THAN 0.1 CM FROM POSTERIOR MARGIN - PREVIOUS BIOPSY SITE. 2. Lymph node, sentinel, biopsy, Left Axillary #1 - ONE BENIGN LYMPH NODE (0/1). 3. Lymph node, sentinel, biopsy, Left Axillary #2 - ONE BENIGN LYMPH NODE (0/1). 4. Lymph node, sentinel, biopsy, Left - ONE BENIGN LYMPH NODE (0/1). 5. Lymph node, sentinel, biopsy, Left Axillary #3 - ONE BENIGN LYMPH NODE (0/1).     Allergies Janeann Forehand, CNA; 05/13/2020 11:12 AM) Sulfur *CHEMICALS*  BEE VENOM  Latex Gloves *MEDICAL DEVICES AND SUPPLIES*  Allergies Reconciled   Medication History Janeann Forehand, CNA; 05/13/2020 11:12 AM) traMADol HCl (50MG Tablet, 1-2  Oral every six hours, as needed, Taken starting 12/13/2017) Active. Illusions C Breast Prosthesis (5 Misc as needed, Taken starting 09/17/2016) Active. (Mastectomy/lumpectomy products) Ativan (1MG Tablet, one Oral daily as needed., Taken starting 11/14/2016) Active. Hydrocortisone (2.5% Cream, 1 (one) Rectal tid and prn BM, Taken starting 04/15/2017)  Active. Anusol-HC (2.5% Cream, 1 (one) Rectal twice daily, as needed, Taken starting 09/30/2017) Active. Travoprost (BAK Free) (0.004% Solution, Ophthalmic) Active. LORazepam (1MG Tablet, Oral) Active. Ciprofloxacin HCl (500MG Tablet, Oral) Active. MetroNIDAZOLE (500MG Tablet, Oral) Active. Letrozole (2.5MG Tablet, Oral) Active. Ondansetron (4MG Tablet Disint, Oral) Active. Atorvastatin Calcium (20MG Tablet, Oral) Active. Medications Reconciled    Review of Systems Stark Klein MD; 05/13/2020 2:41 PM) All other systems negative   Physical Exam Stark Klein MD; 05/13/2020 2:42 PM) General Mental Status-Alert. General Appearance-Consistent with stated age. Hydration-Well hydrated. Voice-Normal.  Head and Neck Head-normocephalic, atraumatic with no lesions or palpable masses.  Eye Sclera/Conjunctiva - Bilateral-No scleral icterus.  Chest and Lung Exam Chest and lung exam reveals -quiet, even and easy respiratory effort with no use of accessory muscles. Inspection Chest Wall - Normal. Back - normal.  Breast Note: left breast without any residual appreciable breast tissue in the upper medial chest wall. there is an irregular firm mass with skin changes and significant irregularity present. This is remote from her incision. It actually looks a bit less red than last month. It doesn't feel larger. It remains very tender. There is no LAD. She doesn't have any nipple retraction or skin changes elsewhere on the breast. The right breast is normal.   Cardiovascular Cardiovascular examination reveals -normal pedal pulses bilaterally. Note: regular rate and rhythm  Abdomen Inspection-Inspection Normal. Palpation/Percussion Palpation and Percussion of the abdomen reveal - Soft, Non Tender, No Rebound tenderness, No Rigidity (guarding) and No hepatosplenomegaly.  Peripheral Vascular Upper Extremity Inspection - Bilateral - Normal - No Clubbing, No  Cyanosis, No Edema, Pulses Intact. Lower Extremity Palpation - Edema - Bilateral - No edema - Bilateral.  Neurologic Neurologic evaluation reveals -alert and oriented x 3 with no impairment of recent or remote memory. Mental Status-Normal.  Musculoskeletal Global Assessment -Note: no gross deformities.  Normal Exam - Left-Upper Extremity Strength Normal and Lower Extremity Strength Normal. Normal Exam - Right-Upper Extremity Strength Normal and Lower Extremity Strength Normal.  Lymphatic Head & Neck  General Head & Neck Lymphatics: Bilateral - Description - Normal. Axillary  General Axillary Region: Bilateral - Description - Normal. Tenderness - Non Tender.    Assessment & Plan Stark Klein MD; 05/13/2020 2:43 PM) NODULE OF LEFT ANTERIOR CHEST WALL (R22.2) Impression: Will plan incisional biopsy under anesthesia given her propensity to pass out as well as how incredibly painful this site is.  I have placed orders. I will send for path as well as fungal/mycobacterium cultures. MALIGNANT NEOPLASM OF UPPER-INNER QUADRANT OF LEFT BREAST IN FEMALE, ESTROGEN RECEPTOR POSITIVE (C50.212) Impression: Continue letrozole.  Mammogram due BCG 07/2020 Current Plans Schedule for Surgery

## 2020-05-18 ENCOUNTER — Encounter (HOSPITAL_BASED_OUTPATIENT_CLINIC_OR_DEPARTMENT_OTHER): Payer: Self-pay | Admitting: General Surgery

## 2020-05-18 LAB — ACID FAST SMEAR (AFB, MYCOBACTERIA): Acid Fast Smear: NEGATIVE

## 2020-05-18 LAB — SURGICAL PATHOLOGY

## 2020-05-18 NOTE — Anesthesia Postprocedure Evaluation (Signed)
Anesthesia Post Note  Patient: Sally Smith  Procedure(s) Performed: INCISIONAL BIOPSY LEFT  CHEST WALL MASS (Left Chest)     Patient location during evaluation: PACU Anesthesia Type: General Level of consciousness: awake and alert Pain management: pain level controlled Vital Signs Assessment: post-procedure vital signs reviewed and stable Respiratory status: spontaneous breathing, nonlabored ventilation, respiratory function stable and patient connected to nasal cannula oxygen Cardiovascular status: blood pressure returned to baseline and stable Postop Assessment: no apparent nausea or vomiting Anesthetic complications: no   No complications documented.  Last Vitals:  Vitals:   05/17/20 1325 05/17/20 1345  BP:  (!) 175/96  Pulse: 83 71  Resp: 11 16  Temp:  (!) 36.3 C  SpO2: 100% 100%    Last Pain:  Vitals:   05/17/20 1309  TempSrc:   PainSc: 8                  Dex Blakely S

## 2020-05-22 LAB — AEROBIC/ANAEROBIC CULTURE W GRAM STAIN (SURGICAL/DEEP WOUND)
Culture: NO GROWTH
Gram Stain: NONE SEEN

## 2020-06-17 LAB — FUNGAL ORGANISM REFLEX

## 2020-06-17 LAB — FUNGUS CULTURE RESULT

## 2020-06-17 LAB — FUNGUS CULTURE WITH STAIN

## 2020-06-20 ENCOUNTER — Other Ambulatory Visit: Payer: Self-pay | Admitting: Hematology

## 2020-06-20 ENCOUNTER — Other Ambulatory Visit: Payer: Self-pay

## 2020-06-20 ENCOUNTER — Ambulatory Visit (AMBULATORY_SURGERY_CENTER): Payer: Self-pay

## 2020-06-20 VITALS — Ht 63.0 in | Wt 108.0 lb

## 2020-06-20 DIAGNOSIS — Z1211 Encounter for screening for malignant neoplasm of colon: Secondary | ICD-10-CM

## 2020-06-20 NOTE — Progress Notes (Signed)
Pre No egg or soy allergy known to patient  No issues with past sedation with any surgeries or procedures Patient denies ever being told they had issues or difficulty with intubation  No FH of Malignant Hyperthermia No diet pills per patient No home 02 use per patient  No blood thinners per patient  Pt reports issues with constipation --takes a daily stool softener and takes a probiotic-will increase fluids No A fib or A flutter  EMMI video via MyChart  COVID 19 guidelines implemented in PV today with Pt and RN  Pt is fully vaccinated for Covid x 2 + booster; NO PA's for preps discussed with pt in PV today  Discussed with pt there will be an out-of-pocket cost for prep and that varies from $0 to 70 dollars  Due to the COVID-19 pandemic we are asking patients to follow certain guidelines.  Pt aware of COVID protocols and [facility] guidelines

## 2020-06-29 ENCOUNTER — Telehealth: Payer: Self-pay | Admitting: Gastroenterology

## 2020-06-29 ENCOUNTER — Other Ambulatory Visit: Payer: Self-pay | Admitting: Hematology

## 2020-06-29 DIAGNOSIS — Z1211 Encounter for screening for malignant neoplasm of colon: Secondary | ICD-10-CM

## 2020-06-29 MED ORDER — PEG 3350-KCL-NA BICARB-NACL 420 G PO SOLR
4000.0000 mL | Freq: Once | ORAL | 0 refills | Status: AC
Start: 2020-06-29 — End: 2020-06-29

## 2020-06-29 NOTE — Telephone Encounter (Signed)
Sent Gol[facility] to [facility] as per pt's request   ONC said due to left breast surgery 5 weeks can she wear her bra-  instructed yes ok to wear the bra for the colon - no issues - fell , bruised and then abscessed, was under the surface and removed- stitches removed last week -   Dr Barry Dienes said ok for Colon as long as site covered and can wear the bra-   No BP sticks left arm due to her breast cancer left side

## 2020-06-30 LAB — ACID FAST CULTURE WITH REFLEXED SENSITIVITIES (MYCOBACTERIA): Acid Fast Culture: NEGATIVE

## 2020-07-04 ENCOUNTER — Encounter: Payer: Self-pay | Admitting: Gastroenterology

## 2020-07-04 ENCOUNTER — Ambulatory Visit (AMBULATORY_SURGERY_CENTER): Payer: BC Managed Care – PPO | Admitting: Gastroenterology

## 2020-07-04 ENCOUNTER — Encounter: Payer: BC Managed Care – PPO | Admitting: Gastroenterology

## 2020-07-04 ENCOUNTER — Other Ambulatory Visit: Payer: Self-pay

## 2020-07-04 VITALS — BP 109/55 | HR 76 | Temp 97.3°F | Resp 13 | Ht 63.0 in | Wt 108.0 lb

## 2020-07-04 DIAGNOSIS — Z1211 Encounter for screening for malignant neoplasm of colon: Secondary | ICD-10-CM

## 2020-07-04 MED ORDER — SODIUM CHLORIDE 0.9 % IV SOLN: 500.0000 mL | Freq: Once | INTRAVENOUS | Status: DC

## 2020-07-04 NOTE — Progress Notes (Signed)
Pt's states no medical or surgical changes since previsit or office visit.  CHECK-IN-AER  VITAL SIGNS-CW 

## 2020-07-04 NOTE — Op Note (Signed)
[facility] Patient Name: Sally Smith Procedure Date: 07/04/2020 10:42 AM MRN: [id number] Endoscopist: Mallie Mussel L. Loletha Carrow , MD Age: 60 Referring MD:  Date of Birth: Sep 09, 1960 Gender: Female Account #: [id number] Procedure:                Colonoscopy Indications:              Screening for colorectal malignant neoplasm                           (reportedly normal colonoscopy > 10 years ago) Medicines:                Monitored Anesthesia Care Procedure:                Pre-Anesthesia Assessment:                           - Prior to the procedure, a History and Physical                            was performed, and patient medications and                            allergies were reviewed. The patient's tolerance of                            previous anesthesia was also reviewed. The risks                            and benefits of the procedure and the sedation                            options and risks were discussed with the patient.                            All questions were answered, and informed consent                            was obtained. Prior Anticoagulants: The patient has                            taken no previous anticoagulant or antiplatelet                            agents. ASA Grade Assessment: II - A patient with                            mild systemic disease. After reviewing the risks                            and benefits, the patient was deemed in                            satisfactory condition to undergo the procedure.  After obtaining informed consent, the colonoscope                            was passed under direct vision. Throughout the                            procedure, the patient's blood pressure, pulse, and                            oxygen saturations were monitored continuously. The                            Olympus PFC-[id number]) Colonoscope was                            introduced through  the anus and advanced to the the                            cecum, identified by appendiceal orifice and                            ileocecal valve. The colonoscopy was somewhat                            difficult due to a tortuous colon. Successful                            completion of the procedure was aided by using                            manual pressure. The patient tolerated the                            procedure well. The quality of the bowel                            preparation was excellent. The ileocecal valve,                            appendiceal orifice, and rectum were photographed. Scope In: 10:47:08 AM Scope Out: 11:00:51 AM Scope Withdrawal Time: 0 hours 9 minutes 9 seconds  Total Procedure Duration: 0 hours 13 minutes 43 seconds  Findings:                 The perianal and digital rectal examinations were                            normal.                           A diffuse area of melanosis was found in the entire                            colon.  Multiple diverticula were found from transverse                            colon to sigmoid colon.                           The exam was otherwise without abnormality on                            direct and retroflexion views. Complications:            No immediate complications. Estimated Blood Loss:     Estimated blood loss: none. Impression:               - Melanosis in the colon.                           - Diverticulosis from transverse colon to sigmoid                            colon.                           - The examination was otherwise normal on direct                            and retroflexion views.                           - No specimens collected. Recommendation:           - Patient has a contact number available for                            emergencies. The signs and symptoms of potential                            delayed complications were discussed with the                             patient. Return to normal activities tomorrow.                            Written discharge instructions were provided to the                            patient.                           - Resume previous diet.                           - Continue present medications.                           - Repeat colonoscopy in 10 years for screening  purposes. Trenton Passow L. Loletha Carrow, MD 07/04/2020 11:06:42 AM This report has been signed electronically.

## 2020-07-04 NOTE — Progress Notes (Signed)
Report given to PACU, vss 

## 2020-07-04 NOTE — Patient Instructions (Signed)
YOU HAD AN ENDOSCOPIC PROCEDURE TODAY AT THE [facility]:   Refer to the procedure report that was given to you for any specific questions about what was found during the examination.  If the procedure report does not answer your questions, please call your gastroenterologist to clarify.  If you requested that your care partner not be given the details of your procedure findings, then the procedure report has been included in a sealed envelope for you to review at your convenience later.  YOU SHOULD EXPECT: Some feelings of bloating in the abdomen. Passage of more gas than usual.  Walking can help get rid of the air that was put into your GI tract during the procedure and reduce the bloating. If you had a lower endoscopy (such as a colonoscopy or flexible sigmoidoscopy) you may notice spotting of blood in your stool or on the toilet paper. If you underwent a bowel prep for your procedure, you may not have a normal bowel movement for a few days.  Please Note:  You might notice some irritation and congestion in your nose or some drainage.  This is from the oxygen used during your procedure.  There is no need for concern and it should clear up in a day or so.  SYMPTOMS TO REPORT IMMEDIATELY:   Following lower endoscopy (colonoscopy or flexible sigmoidoscopy):  Excessive amounts of blood in the stool  Significant tenderness or worsening of abdominal pains  Swelling of the abdomen that is new, acute  Fever of 100F or higher   Following upper endoscopy (EGD)  Vomiting of blood or coffee ground material  New chest pain or pain under the shoulder blades  Painful or persistently difficult swallowing  New shortness of breath  Fever of 100F or higher  Black, tarry-looking stools  For urgent or emergent issues, a gastroenterologist can be reached at any hour by calling [phone]. Do not use MyChart messaging for urgent concerns.    DIET:  We do recommend a small meal at first, but  then you may proceed to your regular diet.  Drink plenty of fluids but you should avoid alcoholic beverages for 24 hours.  ACTIVITY:  You should plan to take it easy for the rest of today and you should NOT DRIVE or use heavy machinery until tomorrow (because of the sedation medicines used during the test).    FOLLOW UP: Our staff will call the number listed on your records 48-72 hours following your procedure to check on you and address any questions or concerns that you may have regarding the information given to you following your procedure. If we do not reach you, we will leave a message.  We will attempt to reach you two times.  During this call, we will ask if you have developed any symptoms of COVID 19. If you develop any symptoms (ie: fever, flu-like symptoms, shortness of breath, cough etc.) before then, please call [phone].  If you test positive for Covid 19 in the 2 weeks post procedure, please call and report this information to us.    If any biopsies were taken you will be contacted by phone or by letter within the next 1-3 weeks.  Please call us at [phone] if you have not heard about the biopsies in 3 weeks.    SIGNATURES/CONFIDENTIALITY: You and/or your care partner have signed paperwork which will be entered into your electronic medical record.  These signatures attest to the fact that that the information above on   your After Visit Summary has been reviewed and is understood.  Full responsibility of the confidentiality of this discharge information lies with you and/or your care-partner. 

## 2020-07-06 ENCOUNTER — Telehealth: Payer: Self-pay | Admitting: *Deleted

## 2020-07-06 NOTE — Telephone Encounter (Signed)
  Follow up Call-  Call back number 07/04/2020  Post procedure Call Back phone  # [phone] TO [place] to leave phone message Yes  Some recent data might be hidden     Patient questions:  Message left to call us if necessary.

## 2020-07-08 NOTE — Progress Notes (Signed)
[place]   [phone] Fax:([phone]) [phone]   Clinic Follow up Note   Patient Care Team: Pomposini, Cherly Anderson, MD as PCP - General (Internal Medicine) [name], MD as Consulting Physician (Hematology) Stark Klein, MD as Consulting Physician (General Surgery) Kyung Rudd, MD as Consulting Physician (Radiation Oncology) Gardenia Phlegm, NP as Nurse Practitioner (Hematology and Oncology)  Date of Service:  07/13/2020  CHIEF COMPLAINT: F/u of left breast cancer  SUMMARY OF ONCOLOGIC HISTORY: Oncology History Overview Note  Cancer Staging Malignant neoplasm of upper-inner quadrant of left breast in female, estrogen receptor positive ([place]) Staging form: Breast, AJCC 8th Edition - Clinical stage from 05/22/2016: Stage IA (cT1a, cN0, cM0, G2, ER: Positive, PR: Positive, HER2: Negative) - Signed by [name], MD on 05/29/2016     Malignant neoplasm of upper-inner quadrant of left breast in female, estrogen receptor positive ([place])  05/10/2016 Mammogram   Bilateral screening mammogram on 05/10/16 showed a possible distortion with calcifications in the left breast.   05/16/2016 Mammogram   Diagnostic mammogram and US showed Persistent distortion in the upper inner quadrant of the left breast possibly correlating with the sonographic area of acoustic shadowing, measuring about 77m. UKoreaof left axilla (-)   05/22/2016 Receptors her2   ER 100%, PR 70%+, Ki67 5%   05/22/2016 Initial Biopsy   Diagnosis Breast, left, needle core biopsy, upper inner - INVASIVE AND IN SITU LOBULAR CARCINOMA, G1-2   05/22/2016 Initial Diagnosis   Malignant neoplasm of upper-inner quadrant of left breast in female, estrogen receptor positive ([place]   05/31/2016 Genetic Testing   Genetic counseling and testing for hereditary cancer syndromes performed on 05/31/2016. Results are negative for pathogenic mutations in 46 genes analyzed by [employer]'s Common Hereditary Cancers Panel. Results are  dated 06/11/2016. Genes analyzed include: APC, ATM, AXIN2, BARD1, BMPR1A, BRCA1, BRCA2, BRIP1, CDH1, CDKN2A, CHEK2, CTNNA1, DICER1, EPCAM, GREM1, HOXB13, KIT, MEN1, MLH1, MSH2, MSH3, MSH6, MUTYH, NBN, NF1, NTHL1, PALB2, PDGFRA, PMS2, POLD1, POLE, PTEN, RAD50, RAD51C, RAD51D, SDHA, SDHB, SDHC, SDHD, SMAD4, SMARCA4, STK11, TP53, TSC1, TSC2, and VHL.    07/01/2016 Oncotype testing   Oncotype testing showed reoccurance score of 11. With 10 year risk of distant reoccurrence 7% with tamoxifen alone   07/04/2016 Surgery   Left breast lumpectomy with radioactive seed x2 and sentinel lymph node biopsy by Dr. BBarry Dienes  08/08/2016 - 09/24/2016 Radiation Therapy   Patient underwent radiation with Dr.Moody   09/2016 -  Anti-estrogen oral therapy   Letrozole 2.5 mg daily starting 09/2016   05/13/2017 Mammogram   Bilateral diagnostic mammogram and left ultrasonography  IMPRESSION:  1. No suspicious mammographic or sonographic abnormalities in the areas of patient's LEFT breast pain.  2. No mammographic evidence of breast malignancy.    10/14/2017 Imaging   10/14/2017 Breast MRI IMPRESSION: No MRI evidence of malignancy in either breast. Left lumpectomy changes. No adenopathy.      CURRENT THERAPY:  Letrozole 2.533monce daily starting 09/2016  INTERVAL HISTORY:  Sally Smith here for a follow up of left breast cancer. She was last seen by me about 6 months ago. She presents to the clinic alone. She notes she is doing well. She notes on 05/17/20 she had skin around her left chest wall incision removed due to brown skin lesion. The results were benign.   She notes her chest pain from surgery is getting better. This can flare with increased muscle use. Her ROM is normal. She notes her PCP  also prescribed her tramadol once before.   She notes she is open to brining her therapy dog to cancer center if allowed to.    REVIEW OF SYSTEMS:   Constitutional: Denies fevers, chills or abnormal weight  loss Eyes: Denies blurriness of vision Ears, nose, mouth, throat, and face: Denies mucositis or sore throat Respiratory: Denies cough, dyspnea or wheezes Cardiovascular: Denies palpitation, chest discomfort or lower extremity swelling Gastrointestinal:  Denies nausea, heartburn or change in bowel habits Skin: Denies abnormal skin rashes Lymphatics: Denies new lymphadenopathy or easy bruising Neurological:Denies numbness, tingling or new weaknesses Behavioral/Psych: Mood is stable, no new changes  All other systems were reviewed with the patient and are negative.  MEDICAL HISTORY:  Past Medical History:  Diagnosis Date  . Allergy    seasonal allergies  . Anxiety    on meds  . Breast cancer ([place]) 07/03/2016  . Cancer of left breast ([place])    [facility] genetic testing negative on 05/31/16  . Complication of anesthesia    Vagals easily to pain  . Diverticulitis   . Genetic testing 06/18/2016   Ms. Dhaliwal underwent genetic counseling and testing for hereditary cancer syndromes on 05/31/2016. Her results were negative for mutations in all 46 genes analyzed by [facility]'s 46-gene Common Hereditary Cancers Panel. Genes analyzed include: APC, ATM, AXIN2, BARD1, BMPR1A, BRCA1, BRCA2, BRIP1, CDH1, CDKN2A, CHEK2, CTNNA1, DICER1, EPCAM, GREM1, HOXB13, KIT, MEN1, MLH1, MSH2, MSH3, MSH6, MUTYH, NBN,  . Glaucoma    on meds  . Hyperlipidemia    on meds  . Personal history of radiation therapy 2018   Left Breast Cancer  . Skin cancer    left breast     SURGICAL HISTORY: Past Surgical History:  Procedure Laterality Date  . BREAST LUMPECTOMY Left 07/03/2016  . BREAST LUMPECTOMY WITH RADIOACTIVE SEED AND SENTINEL LYMPH NODE BIOPSY Left 07/04/2016   Procedure: LEFT BREAST LUMPECTOMY WITH RADIOACTIVE SEED X 2 AND SENTINEL LYMPH NODE BIOPSY;  Surgeon: Stark Klein, MD;  Location: [facility];  Service: General;  Laterality: Left;  . SKIN BIOPSY Left 05/17/2020   Procedure: INCISIONAL BIOPSY  LEFT  CHEST WALL MASS;  Surgeon: Stark Klein, MD;  Location: [facility];  Service: General;  Laterality: Left;  . surgery on right leg    . TONSILLECTOMY    . [place]    I have reviewed the social history and family history with the patient and they are unchanged from previous note.  ALLERGIES:  is allergic to bee venom, elemental sulfur, erythromycin base, latex, and contrast media [iodinated diagnostic agents].  MEDICATIONS:  Current Outpatient Medications  Medication Sig Dispense Refill  . acetaminophen (TYLENOL) 500 MG tablet Take 1,000 mg by mouth every 6 (six) hours as needed for mild pain, moderate pain, fever or headache.    . ALPRAZolam (XANAX) 0.25 MG tablet Take 0.25 mg by mouth at bedtime as needed for anxiety.    Marland Kitchen aspirin EC 81 MG tablet Take 81 mg by mouth daily. Swallow whole. (Patient not taking: No sig reported)    . atorvastatin (LIPITOR) 20 MG tablet Take 20 mg by mouth daily.    Marland Kitchen buPROPion (WELLBUTRIN XL) 150 MG 24 hr tablet TAKE 1 TABLET BY MOUTH EVERY DAY. 30 tablet 5  . Calcium Carb-Cholecalciferol (CALCIUM-VITAMIN D) 500-200 MG-UNIT tablet Take 1 tablet by mouth daily.     . cholecalciferol (VITAMIN D) 1000 units tablet Take 1,000 Units by mouth daily.    Marland Kitchen  Docusate Sodium (STOOL SOFTENER LAXATIVE PO) Take 1 tablet by mouth daily at 6 (six) AM.    . EPINEPHrine 0.3 mg/0.3 mL IJ SOAJ injection Inject into the muscle as needed. (Patient not taking: No sig reported)    . ibuprofen (ADVIL,MOTRIN) 200 MG tablet Take 400 mg by mouth every 6 (six) hours as needed for fever, headache, mild pain, moderate pain or cramping.    . latanoprost (XALATAN) 0.005 % ophthalmic solution Place 1 drop into both eyes at bedtime.    Marland Kitchen letrozole (FEMARA) 2.5 MG tablet TAKE 1 TABLET BY MOUTH ONCE A DAY. 90 tablet 3  . LORazepam (ATIVAN) 0.5 MG tablet Take 0.5-1 tablets (0.25-0.5 mg total) at bedtime as needed by mouth for anxiety. 1 tablet po 30  minutes prior to radiation and or qHS prn anxiety 30 tablet 2  . LORazepam (ATIVAN) 1 MG tablet Take 1 mg by mouth 3 (three) times daily.    Marland Kitchen PROCTO-MED HC 2.5 % rectal cream Apply topically daily as needed.    . Saccharomyces boulardii (DIGESTIVE PROBIOTIC PO) Take 1 capsule by mouth daily at 6 (six) AM.    . traMADol (ULTRAM) 50 MG tablet Take 1 tablet (50 mg total) by mouth every 12 (twelve) hours as needed. 20 tablet 1  . zoledronic acid (RECLAST) 5 MG/100ML SOLN injection Inject 5 mg into the vein. EVERY 6MOS (due in May/Nov)    . zolpidem (AMBIEN) 10 MG tablet Take 5 mg by mouth at bedtime as needed for sleep.     No current facility-administered medications for this visit.    PHYSICAL EXAMINATION: ECOG PERFORMANCE STATUS: 1 - Symptomatic but completely ambulatory  Vitals:   07/13/20 1057  BP: 129/61  Pulse: 61  Resp: 16  Temp: 97.7 F (36.5 C)  SpO2: 100%   Filed Weights   07/13/20 1057  Weight: 107 lb 4.8 oz (48.7 kg)    GENERAL:alert, no distress and comfortable SKIN: skin color, texture, turgor are normal, no rashes or significant lesions EYES: normal, Conjunctiva are pink and non-injected, sclera clear  NECK: supple, thyroid normal size, non-tender, without nodularity LYMPH:  no palpable lymphadenopathy in the cervical, axillary  LUNGS: clear to auscultation and percussion with normal breathing effort HEART: regular rate & rhythm and no murmurs and no lower extremity edema ABDOMEN:abdomen soft, non-tender and normal bowel sounds Musculoskeletal:no cyanosis of digits and no clubbing  NEURO: alert & oriented x 3 with fluent speech, no focal motor/sensory deficits BREAST: s/p left lumpectomy: Surgical incision healed well (+) recent left chest wall surgery site is corvered in guaze, dry, with healing wound. No palpable mass, nodules or adenopathy bilaterally. Breast exam benign.   LABORATORY DATA:  I have reviewed the data as listed CBC Latest Ref Rng & Units  07/13/2020 01/20/2020 06/01/2019  WBC 4.0 - 10.5 K/uL 6.0 6.2 5.1  Hemoglobin 12.0 - 15.0 g/dL 13.7 13.4 12.5  Hematocrit 36.0 - 46.0 % 38.7 38.3 36.9  Platelets 150 - 400 K/uL 180 177 160     CMP Latest Ref Rng & Units 07/13/2020 01/20/2020 06/01/2019  Glucose 70 - 99 mg/dL 78 84 75  BUN 8 - 23 mg/dL _0 Creatinine 0.44 - 1.00 mg/dL 0.71 0.71 0.73  Sodium 135 - 145 mmol/L 141 140 139  Potassium 3.5 - 5.1 mmol/L 3.7 3.4(L) 4.1  Chloride 98 - 111 mmol/L 104 105 103  CO2 22 - 32 mmol/L _1 Calcium 8.9 - 10.3 mg/dL 9.7 10.0 9.5  Total Protein 6.5 - 8.1 g/dL 8.2(H) 8.3(H) 7.9  Total Bilirubin 0.3 - 1.2 mg/dL 0.6 0.5 0.4  Alkaline Phos 38 - 126 U/L 78 85 97  AST 15 - 41 U/L 37 23 26  ALT 0 - 44 U/L 35 20 33    Colonoscopy by Dr Loletha Carrow 07/04/20  IMPRESSION - Melanosis in the colon. - Diverticulosis from transverse colon to sigmoid colon. - The examination was otherwise normal on direct and retroflexion views. - No specimens collected.   RADIOGRAPHIC STUDIES: I have personally reviewed the radiological images as listed and agreed with the findings in the report. No results found.   ASSESSMENT & PLAN:  Kmya Placide is a 60 y.o. female with    1. 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 She is s/p left breast lumpectomy and radiation. She is currently onadjuvantLetrozole since 09/2016.Tolerating well,plan for5-7yearsand managing her mood swings, vaginal dryness, low libido,hair thinningand osteopenia.Manageable on Wellbutrin.  -Given brown lesions near left chest wall incision, she underwent incisional biopsy on 05/17/20. Path was benign.  -She is clinically doing well. Lab reviewed, her CBC and CMP are within normal limits except protein 8.2. Her physical exam and her 07/2019 mammogram were unremarkable. There is no clinical concern  for recurrence. -Continue surveillance. Next mammogram will be due Summer 2022.  Due to her recent left chest wall surgery and she is still recovering, will postpone her mammogram for months. -Continue Letrozole.  -F/u in 6 months   2. Leftchest wall pain andtenderness  -Shepreviously had left breast pain after surgery.   -Has improved over time and will flare with very fast movements or over exertion. She takes Tramadol only as needed. She will continue to refill with PCP.   3. Osteopenia  -Her 10/29/16 DEXA shows osteopenia with a lefthipT-Score of -1.7. Her 11/2018 DEXA shows increased osteopenia with T-score -2.1 at right hip. -I started her on Zometa q83month for 2 years on 01/20/20. I encouraged her to maintain her dental health.  -She will continueTumsand vitamin D supplements.Willcheck VitD level every 6 months  -Next DEXA in 11/2020.   4. GeneticTesting was negative  5. Anxiety -She useAmbienat night to help her sleepas needed.Continue to f/u withPCP -Mood much improved on Wellbutrin. Will continue.   6. Macrocytosiswithout anemia -MCV has been slightly elevated, at 101.9on6/3/20and at 102.8 on 06/01/19. Her 4/12/21B12 and folate levels were normal.  -Improving. She will continue multivitamin.   7. Mood swings, vaginal dryness, low libido and hair thinning, secondary to Letrozole -Mood much improved on Wellbutrin (06/01/19) -I also encouraged her to talk to her Gyn about other suggestions and advised her to avoid any estrogen containing products.I suggest she use lubrication.    PLAN -Proceed with Zometa today, continue every 6 months for 2 more doses  -Continue Letrozole -Mammogram in mid July 2022.  -DEXA in 11/2020 -Lab and F/u and Zometa in 6 months    No problem-specific Assessment & Plan notes found for this encounter.   Orders Placed This Encounter  Procedures  . MM DIAG BREAST TOMO BILATERAL    Pt had left chest wall surgery  in March 2022, would like to postpone mammogram for a month for better recovery before mammogram    Standing Status:   Future    Standing Expiration Date:   07/13/2021    Order Specific Question:   Reason for Exam (  SYMPTOM  OR DIAGNOSIS REQUIRED)    Answer:   screening    Order Specific Question:   Preferred imaging location?    Answer:   [facility]  . DG Bone Density    Standing Status:   Future    Standing Expiration Date:   07/13/2021    Order Specific Question:   Reason for Exam (SYMPTOM  OR DIAGNOSIS REQUIRED)    Answer:   screening    Order Specific Question:   Preferred imaging location?    Answer:   [facility]   All questions were answered. The patient knows to call the clinic with any problems, questions or concerns. No barriers to learning was detected. The total time spent in the appointment was 30 minutes.     [name], MD 07/13/2020   I, Joslyn Devon, am acting as scribe for [name], MD.   I have reviewed the above documentation for accuracy and completeness, and I agree with the above.

## 2020-07-11 ENCOUNTER — Other Ambulatory Visit: Payer: Self-pay

## 2020-07-11 DIAGNOSIS — Z170 Estrogen receptor positive status [ER+]: Secondary | ICD-10-CM

## 2020-07-13 ENCOUNTER — Inpatient Hospital Stay: Payer: BC Managed Care – PPO | Attending: Hematology

## 2020-07-13 ENCOUNTER — Other Ambulatory Visit: Payer: Self-pay

## 2020-07-13 ENCOUNTER — Inpatient Hospital Stay: Payer: BC Managed Care – PPO

## 2020-07-13 ENCOUNTER — Encounter: Payer: Self-pay | Admitting: Hematology

## 2020-07-13 ENCOUNTER — Inpatient Hospital Stay (HOSPITAL_BASED_OUTPATIENT_CLINIC_OR_DEPARTMENT_OTHER): Payer: BC Managed Care – PPO | Admitting: Hematology

## 2020-07-13 VITALS — BP 129/61 | HR 61 | Temp 97.7°F | Resp 16 | Ht 63.0 in | Wt 107.3 lb

## 2020-07-13 DIAGNOSIS — E2839 Other primary ovarian failure: Secondary | ICD-10-CM

## 2020-07-13 DIAGNOSIS — C50212 Malignant neoplasm of upper-inner quadrant of left female breast: Secondary | ICD-10-CM | POA: Diagnosis not present

## 2020-07-13 DIAGNOSIS — F419 Anxiety disorder, unspecified: Secondary | ICD-10-CM | POA: Insufficient documentation

## 2020-07-13 DIAGNOSIS — M85851 Other specified disorders of bone density and structure, right thigh: Secondary | ICD-10-CM | POA: Diagnosis not present

## 2020-07-13 DIAGNOSIS — R6882 Decreased libido: Secondary | ICD-10-CM | POA: Diagnosis not present

## 2020-07-13 DIAGNOSIS — Z170 Estrogen receptor positive status [ER+]: Secondary | ICD-10-CM | POA: Insufficient documentation

## 2020-07-13 DIAGNOSIS — Z79811 Long term (current) use of aromatase inhibitors: Secondary | ICD-10-CM | POA: Insufficient documentation

## 2020-07-13 LAB — CBC WITH DIFFERENTIAL (CANCER CENTER ONLY)
Abs Immature Granulocytes: 0.02 10*3/uL (ref 0.00–0.07)
Basophils Absolute: 0 10*3/uL (ref 0.0–0.1)
Basophils Relative: 1 %
Eosinophils Absolute: 0.1 10*3/uL (ref 0.0–0.5)
Eosinophils Relative: 2 %
HCT: 38.7 % (ref 36.0–46.0)
Hemoglobin: 13.7 g/dL (ref 12.0–15.0)
Immature Granulocytes: 0 %
Lymphocytes Relative: 35 %
Lymphs Abs: 2.1 10*3/uL (ref 0.7–4.0)
MCH: 35.5 pg — ABNORMAL HIGH (ref 26.0–34.0)
MCHC: 35.4 g/dL (ref 30.0–36.0)
MCV: 100.3 fL — ABNORMAL HIGH (ref 80.0–100.0)
Monocytes Absolute: 0.6 10*3/uL (ref 0.1–1.0)
Monocytes Relative: 11 %
Neutro Abs: 3.1 10*3/uL (ref 1.7–7.7)
Neutrophils Relative %: 51 %
Platelet Count: 180 10*3/uL (ref 150–400)
RBC: 3.86 MIL/uL — ABNORMAL LOW (ref 3.87–5.11)
RDW: 12.4 % (ref 11.5–15.5)
WBC Count: 6 10*3/uL (ref 4.0–10.5)
nRBC: 0 % (ref 0.0–0.2)

## 2020-07-13 LAB — CMP (CANCER CENTER ONLY)
ALT: 35 U/L (ref 0–44)
AST: 37 U/L (ref 15–41)
Albumin: 4.5 g/dL (ref 3.5–5.0)
Alkaline Phosphatase: 78 U/L (ref 38–126)
Anion gap: 11 (ref 5–15)
BUN: 8 mg/dL (ref 8–23)
CO2: 26 mmol/L (ref 22–32)
Calcium: 9.7 mg/dL (ref 8.9–10.3)
Chloride: 104 mmol/L (ref 98–111)
Creatinine: 0.71 mg/dL (ref 0.44–1.00)
GFR, Estimated: >60 mL/min (ref >60)
Glucose, Bld: 78 mg/dL (ref 70–99)
Potassium: 3.7 mmol/L (ref 3.5–5.1)
Sodium: 141 mmol/L (ref 135–145)
Total Bilirubin: 0.6 mg/dL (ref 0.3–1.2)
Total Protein: 8.2 g/dL — ABNORMAL HIGH (ref 6.5–8.1)

## 2020-07-13 MED ORDER — ZOLEDRONIC ACID 4 MG/100ML IV SOLN
INTRAVENOUS | Status: AC
  Filled 2020-07-13: qty 100

## 2020-07-13 MED ORDER — ZOLEDRONIC ACID 4 MG/100ML IV SOLN
4.0000 mg | Freq: Once | INTRAVENOUS | Status: AC
  Administered 2020-07-13: 4 mg via INTRAVENOUS

## 2020-07-13 MED ORDER — SODIUM CHLORIDE 0.9 % IV SOLN
Freq: Once | INTRAVENOUS | Status: AC
  Filled 2020-07-13: qty 250

## 2020-07-13 NOTE — Patient Instructions (Signed)
Zoledronic Acid Injection (Hypercalcemia, Oncology) What is this medicine? ZOLEDRONIC ACID (ZOE le dron ik AS id) slows calcium loss from bones. It high calcium levels in the blood from some kinds of cancer. It may be used in other people at risk for bone loss. This medicine may be used for other purposes; ask your health care provider or pharmacist if you have questions. COMMON BRAND NAME(S): Zometa What should I tell my health care provider before I take this medicine? They need to know if you have any of these conditions:  cancer  dehydration  dental disease  kidney disease  liver disease  low levels of calcium in the blood  lung or breathing disease (asthma)  receiving steroids like dexamethasone or prednisone  an unusual or allergic reaction to zoledronic acid, other medicines, foods, dyes, or preservatives  pregnant or trying to get pregnant  breast-feeding How should I use this medicine? This drug is injected into a vein. It is given by a health care provider in a hospital or clinic setting. Talk to your health care provider about the use of this drug in children. Special care may be needed. Overdosage: If you think you have taken too much of this medicine contact a poison control center or emergency room at once. NOTE: This medicine is only for you. Do not share this medicine with others. What if I miss a dose? Keep appointments for follow-up doses. It is important not to miss your dose. Call your health care provider if you are unable to keep an appointment. What may interact with this medicine?  certain antibiotics given by injection  NSAIDs, medicines for pain and inflammation, like ibuprofen or naproxen  some diuretics like bumetanide, furosemide  teriparatide  thalidomide This list may not describe all possible interactions. Give your health care provider a list of all the medicines, herbs, non-prescription drugs, or dietary supplements you use. Also tell  them if you smoke, drink alcohol, or use illegal drugs. Some items may interact with your medicine. What should I watch for while using this medicine? Visit your health care provider for regular checks on your progress. It may be some time before you see the benefit from this drug. Some people who take this drug have severe bone, joint, or muscle pain. This drug may also increase your risk for jaw problems or a broken thigh bone. Tell your health care provider right away if you have severe pain in your jaw, bones, joints, or muscles. Tell you health care provider if you have any pain that does not go away or that gets worse. Tell your dentist and dental surgeon that you are taking this drug. You should not have major dental surgery while on this drug. See your dentist to have a dental exam and fix any dental problems before starting this drug. Take good care of your teeth while on this drug. Make sure you see your dentist for regular follow-up appointments. You should make sure you get enough calcium and vitamin D while you are taking this drug. Discuss the foods you eat and the vitamins you take with your health care provider. Check with your health care provider if you have severe diarrhea, nausea, and vomiting, or if you sweat a lot. The loss of too much body fluid may make it dangerous for you to take this drug. You may need blood work done while you are taking this drug. Do not become pregnant while taking this drug. Women should inform their health care provider   if they wish to become pregnant or think they might be pregnant. There is potential for serious harm to an unborn child. Talk to your health care provider for more information. What side effects may I notice from receiving this medicine? Side effects that you should report to your doctor or health care provider as soon as possible:  allergic reactions (skin rash, itching or hives; swelling of the face, lips, or tongue)  bone  pain  infection (fever, chills, cough, sore throat, pain or trouble passing urine)  jaw pain, especially after dental work  joint pain  kidney injury (trouble passing urine or change in the amount of urine)  low blood pressure (dizziness; feeling faint or lightheaded, falls; unusually weak or tired)  low calcium levels (fast heartbeat; muscle cramps or pain; pain, tingling, or numbness in the hands or feet; seizures)  low magnesium levels (fast, irregular heartbeat; muscle cramp or pain; muscle weakness; tremors; seizures)  low red blood cell counts (trouble breathing; feeling faint; lightheaded, falls; unusually weak or tired)  muscle pain  redness, blistering, peeling, or loosening of the skin, including inside the mouth  severe diarrhea  swelling of the ankles, feet, hands  trouble breathing Side effects that usually do not require medical attention (report to your doctor or health care provider if they continue or are bothersome):  anxious  constipation  coughing  depressed mood  eye irritation, itching, or pain  fever  general ill feeling or flu-like symptoms  nausea  pain, redness, or irritation at site where injected  trouble sleeping This list may not describe all possible side effects. Call your doctor for medical advice about side effects. You may report side effects to FDA at 1-800-FDA-1088. Where should I keep my medicine? This drug is given in a hospital or clinic. It will not be stored at home. NOTE: This sheet is a summary. It may not cover all possible information. If you have questions about this medicine, talk to your doctor, pharmacist, or health care provider.  2021 Elsevier/Gold Standard (2018-11-20 09:13:00)  

## 2020-07-14 ENCOUNTER — Telehealth: Payer: Self-pay | Admitting: Hematology

## 2020-07-14 NOTE — Telephone Encounter (Signed)
Left message with follow-up appointment per 5/25 los. Gave option to call back to reschedule if needed.

## 2020-09-05 ENCOUNTER — Other Ambulatory Visit: Payer: Self-pay | Admitting: Hematology

## 2020-09-08 ENCOUNTER — Institutional Professional Consult (permissible substitution): Payer: BC Managed Care – PPO | Admitting: Plastic Surgery

## 2020-09-28 ENCOUNTER — Ambulatory Visit
Admission: RE | Admit: 2020-09-28 | Discharge: 2020-09-28 | Disposition: A | Discharge status: Home / Self Care | Payer: BC Managed Care – PPO | Source: Ambulatory Visit | Attending: Hematology | Admitting: Hematology

## 2020-09-28 ENCOUNTER — Other Ambulatory Visit: Payer: Self-pay

## 2020-09-28 DIAGNOSIS — Z170 Estrogen receptor positive status [ER+]: Secondary | ICD-10-CM

## 2020-09-28 DIAGNOSIS — C50212 Malignant neoplasm of upper-inner quadrant of left female breast: Secondary | ICD-10-CM

## 2020-10-14 ENCOUNTER — Encounter: Payer: Self-pay | Admitting: Hematology

## 2020-11-15 ENCOUNTER — Other Ambulatory Visit: Payer: Self-pay | Admitting: Hematology

## 2020-11-15 DIAGNOSIS — Z170 Estrogen receptor positive status [ER+]: Secondary | ICD-10-CM

## 2020-11-15 DIAGNOSIS — C50212 Malignant neoplasm of upper-inner quadrant of left female breast: Secondary | ICD-10-CM

## 2020-11-22 ENCOUNTER — Other Ambulatory Visit: Payer: Self-pay | Admitting: Hematology

## 2020-11-22 DIAGNOSIS — C50212 Malignant neoplasm of upper-inner quadrant of left female breast: Secondary | ICD-10-CM

## 2020-11-25 HISTORY — PX: EYE SURGERY: SHX253

## 2020-11-30 ENCOUNTER — Telehealth: Payer: Self-pay | Admitting: Hematology

## 2020-11-30 NOTE — Telephone Encounter (Signed)
Rescheduled per 11/25 sch change, pt has been called and confirmed appt

## 2020-12-20 ENCOUNTER — Other Ambulatory Visit: Payer: Self-pay | Admitting: Hematology

## 2020-12-20 DIAGNOSIS — Z170 Estrogen receptor positive status [ER+]: Secondary | ICD-10-CM

## 2020-12-23 ENCOUNTER — Other Ambulatory Visit: Payer: Self-pay | Admitting: Hematology

## 2020-12-23 DIAGNOSIS — C50212 Malignant neoplasm of upper-inner quadrant of left female breast: Secondary | ICD-10-CM

## 2021-01-02 ENCOUNTER — Other Ambulatory Visit: Payer: Self-pay

## 2021-01-02 ENCOUNTER — Ambulatory Visit
Admission: RE | Admit: 2021-01-02 | Discharge: 2021-01-02 | Disposition: A | Discharge status: Home / Self Care | Payer: BC Managed Care – PPO | Source: Ambulatory Visit | Attending: Hematology | Admitting: Hematology

## 2021-01-02 DIAGNOSIS — E2839 Other primary ovarian failure: Secondary | ICD-10-CM

## 2021-01-03 ENCOUNTER — Ambulatory Visit (INDEPENDENT_AMBULATORY_CARE_PROVIDER_SITE_OTHER): Payer: BC Managed Care – PPO | Admitting: Plastic Surgery

## 2021-01-03 ENCOUNTER — Encounter: Payer: Self-pay | Admitting: Plastic Surgery

## 2021-01-03 ENCOUNTER — Other Ambulatory Visit: Payer: Self-pay

## 2021-01-03 DIAGNOSIS — S21109A Unspecified open wound of unspecified front wall of thorax without penetration into thoracic cavity, initial encounter: Secondary | ICD-10-CM | POA: Diagnosis not present

## 2021-01-03 NOTE — Progress Notes (Signed)
   Patient ID: Sally Smith, female    DOB: 03/06/1960, 60 y.o.   MRN: [id number]   Chief Complaint  Patient presents with   Skin Problem    The patient is a 60-year-old female here for evaluation of her left chest wall.  She had breast cancer 4 years ago and was treated with a partial mastectomy and postop radiation.  Since that time she has had continual breakdown of her skin.  Most recently in March she had excision with concerns that there might be caner recurrence.  Fortunately it was negative.  Now the concern is the nonhealing wound.  The entire area is about 3 cm of skin discoloration and tenderness.  The actual wound is more of a half a centimeter in size.   Review of Systems  Constitutional: Negative.   HENT: Negative.    Eyes: Negative.   Respiratory: Negative.    Cardiovascular: Negative.   Gastrointestinal: Negative.   Endocrine: Negative.   Genitourinary: Negative.   Musculoskeletal: Negative.   Skin:  Positive for wound.  Hematological: Negative.   Psychiatric/Behavioral: Negative.     Past Medical History:  Diagnosis Date   Allergy    seasonal allergies   Anxiety    on meds   Breast cancer (HCC) 07/03/2016   Cancer of left breast (HCC)    [facility] genetic testing negative on 05/31/16   Complication of anesthesia    Vagals easily to pain   Diverticulitis    Genetic testing 06/18/2016   [name] underwent genetic counseling and testing for hereditary cancer syndromes on 05/31/2016. Her results were negative for mutations in all 46 genes analyzed by [facility]'s 46-gene Common Hereditary Cancers Panel. Genes analyzed include: APC, ATM, AXIN2, BARD1, BMPR1A, BRCA1, BRCA2, BRIP1, CDH1, CDKN2A, CHEK2, CTNNA1, DICER1, EPCAM, GREM1, HOXB13, KIT, MEN1, MLH1, MSH2, MSH3, MSH6, MUTYH, NBN,   Glaucoma    on meds   Hyperlipidemia    on meds   Personal history of radiation therapy 2018   Left Breast Cancer   Skin cancer    left breast     Past Surgical  History:  Procedure Laterality Date   BREAST EXCISIONAL BIOPSY Left 04/2020   BREAST LUMPECTOMY Left 07/03/2016   BREAST LUMPECTOMY WITH RADIOACTIVE SEED AND SENTINEL LYMPH NODE BIOPSY Left 07/04/2016   Procedure: LEFT BREAST LUMPECTOMY WITH RADIOACTIVE SEED X 2 AND SENTINEL LYMPH NODE BIOPSY;  Surgeon: Byerly, Faera, MD;  Location: [facility];  Service: General;  Laterality: Left;   SKIN BIOPSY Left 05/17/2020   Procedure: INCISIONAL BIOPSY LEFT  CHEST WALL MASS;  Surgeon: Byerly, Faera, MD;  Location: [facility];  Service: General;  Laterality: Left;   surgery on right leg     TONSILLECTOMY     WISDOM TOOTH EXTRACTION  1985      Current Outpatient Medications:    acetaminophen (TYLENOL) 500 MG tablet, Take 1,000 mg by mouth every 6 (six) hours as needed for mild pain, moderate pain, fever or headache., Disp: , Rfl:    ALPRAZolam (XANAX) 0.25 MG tablet, Take 0.25 mg by mouth at bedtime as needed for anxiety., Disp: , Rfl:    aspirin EC 81 MG tablet, Take 81 mg by mouth daily. Swallow whole., Disp: , Rfl:    atorvastatin (LIPITOR) 20 MG tablet, Take 20 mg by mouth daily., Disp: , Rfl:    buPROPion (WELLBUTRIN XL) 150 MG 24 hr tablet, TAKE 1 TABLET BY MOUTH EVERY DAY., Disp: 30 tablet, Rfl:   0   Calcium Carb-Cholecalciferol (CALCIUM-VITAMIN D) 500-200 MG-UNIT tablet, Take 1 tablet by mouth daily. , Disp: , Rfl:    cholecalciferol (VITAMIN D) 1000 units tablet, Take 1,000 Units by mouth daily., Disp: , Rfl:    Docusate Sodium (STOOL SOFTENER LAXATIVE PO), Take 1 tablet by mouth daily at 6 (six) AM., Disp: , Rfl:    EPINEPHrine 0.3 mg/0.3 mL IJ SOAJ injection, Inject into the muscle as needed., Disp: , Rfl:    hyaluronate sodium (RADIAPLEXRX) GEL, APPLY TOPICALLY ONCE AS NEEDED FOR UP TO 1 DOSE, Disp: 170 g, Rfl: 0   ibuprofen (ADVIL,MOTRIN) 200 MG tablet, Take 400 mg by mouth every 6 (six) hours as needed for fever, headache, mild pain, moderate pain or cramping.,  Disp: , Rfl:    latanoprost (XALATAN) 0.005 % ophthalmic solution, Place 1 drop into both eyes at bedtime., Disp: , Rfl:    letrozole (FEMARA) 2.5 MG tablet, TAKE 1 TABLET BY MOUTH ONCE A DAY., Disp: 90 tablet, Rfl: 3   LORazepam (ATIVAN) 1 MG tablet, Take 1 mg by mouth 3 (three) times daily., Disp: , Rfl:    ondansetron (ZOFRAN-ODT) 4 MG disintegrating tablet, TAKE ONE TABLET BY MOUTH EVERY 8 HOURS AS NEEDED FOR NAUSEA., Disp: 10 tablet, Rfl: 0   PROCTO-MED HC 2.5 % rectal cream, Apply topically daily as needed., Disp: , Rfl:    Saccharomyces boulardii (DIGESTIVE PROBIOTIC PO), Take 1 capsule by mouth daily at 6 (six) AM., Disp: , Rfl:    traMADol (ULTRAM) 50 MG tablet, Take 1 tablet (50 mg total) by mouth every 12 (twelve) hours as needed., Disp: 20 tablet, Rfl: 1   zoledronic acid (RECLAST) 5 MG/100ML SOLN injection, Inject 5 mg into the vein. EVERY 6MOS (due in May/Nov), Disp: , Rfl:    zolpidem (AMBIEN) 10 MG tablet, Take 5 mg by mouth at bedtime as needed for sleep., Disp: , Rfl:    LORazepam (ATIVAN) 0.5 MG tablet, Take 0.5-1 tablets (0.25-0.5 mg total) at bedtime as needed by mouth for anxiety. 1 tablet po 30 minutes prior to radiation and or qHS prn anxiety (Patient not taking: Reported on 01/03/2021), Disp: 30 tablet, Rfl: 2   Objective:   Vitals:   01/03/21 1144  BP: 127/73  Pulse: 67  SpO2: 100%    Physical Exam Vitals and nursing note reviewed.  Constitutional:      Appearance: Normal appearance.  HENT:     Head: Normocephalic and atraumatic.  Cardiovascular:     Rate and Rhythm: Normal rate.     Pulses: Normal pulses.  Pulmonary:     Effort: Pulmonary effort is normal.  Abdominal:     Palpations: Abdomen is soft.  Musculoskeletal:        General: No swelling.  Skin:    Capillary Refill: Capillary refill takes less than 2 seconds.     Coloration: Skin is not jaundiced.     Findings: No bruising.  Neurological:     Mental Status: She is alert and oriented to  person, place, and time.  Psychiatric:        Mood and Affect: Mood normal.        Behavior: Behavior normal.        Thought Content: Thought content normal.    Assessment & Plan:  Open wound of chest wall, unspecified laterality, initial encounter  We discussed the options of minimally invasive with ACell or myriad and excision of the wound.  She could have a VAC and this would  take time to heal over probably at 6 weeks.  The other option is a latissimus muscle flap.  That will probably be a hard year flap and provide her with some better padding.  She is going to think it over and we will talk again on Thursday.  Pictures were obtained of the patient and placed in the chart with the patient's or guardian's permission.  [place], DO

## 2021-01-05 ENCOUNTER — Telehealth (INDEPENDENT_AMBULATORY_CARE_PROVIDER_SITE_OTHER): Payer: BC Managed Care – PPO | Admitting: Plastic Surgery

## 2021-01-05 DIAGNOSIS — S21109A Unspecified open wound of unspecified front wall of thorax without penetration into thoracic cavity, initial encounter: Secondary | ICD-10-CM | POA: Diagnosis not present

## 2021-01-06 ENCOUNTER — Telehealth: Payer: Self-pay

## 2021-01-06 ENCOUNTER — Encounter: Payer: Self-pay | Admitting: Plastic Surgery

## 2021-01-06 NOTE — Progress Notes (Signed)
   Subjective:    Patient ID: Sally Smith, female    DOB: 1960-11-02, 60 y.o.   MRN: [id number]  The patient is a 60 yrs old female joining me by phone.  She was seen in the office early this week for a wound on her chest.  She underwent a left partial mastectomy 4 years ago.  She then had radiation to the area. Over the past 4 years she has been dealing with a wound of the left chest.  There was concern for recurrent cancer.  She underwent excision and fortunately the specimen was negative.  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       Review of Systems  Constitutional: Negative.   Eyes: Negative.   Respiratory: Negative.    Endocrine: Negative.   Genitourinary: Negative.   Musculoskeletal: Negative.   Skin:  Positive for color change and wound.  Neurological: Negative.       Objective:   Physical Exam     Assessment & Plan:     ICD-10-CM   1. Open wound of chest wall, unspecified laterality, initial encounter  S21.109A       I connected with  Sally Smith on 01/06/21 by phone and verified that I am speaking with the correct person using two identifiers.   I discussed the limitations of evaluation and management by telemedicine. The patient expressed understanding and agreed to proceed.  The patient was at home and I was in [place].  I spent 20 minutes in the following manner: review of information, review of CT scan with Dr. Barry Dienes and Dr. Kipp Brood, discussion with the patient, review of options, documentation and referral to Dr. Kipp Brood.  Plan for Dr. Kipp Brood to evaluate the patient.  We will then all talk and formulate a plan.  The plan will likely be for a joint case to the OR for exam of the wound, rib, possible resection, debridement and likely latissimus flap.

## 2021-01-06 NOTE — Telephone Encounter (Signed)
Pt called stating that her surgical site has opened back up and stated she had an appt with Dr Elisabeth Cara at the [facility] regarding her surgical site.  Pt stated that Dr. Elisabeth Cara wants to do surgery and pt wants to know if her Zometa infusions will interrupt/interfere with her getting her surgery.  Pt has a follow-up appt with Dr. Burr Medico on 01/17/2021 for labs, Dr. Burr Medico, and infusion (Zometa).  Instructed pt to discuss all her concerns regarding her surgery and Zometa with Dr. Burr Medico.  Will make Dr. Burr Medico aware.

## 2021-01-09 ENCOUNTER — Telehealth: Payer: Self-pay

## 2021-01-09 ENCOUNTER — Telehealth: Payer: Self-pay | Admitting: *Deleted

## 2021-01-09 NOTE — Telephone Encounter (Signed)
Per Dr.Feng, called pt with message below. Pt verbalized understanding.

## 2021-01-09 NOTE — Telephone Encounter (Signed)
LVM informing pt that her Zometa infusion will not interfere with her upcoming surgery.  Dr Burr Medico, stated the pt can refrain from her infusion for 1 to 2 months if she would like.  Instructed pt to contact Dr Ernestina Penna office should she have additional questions.

## 2021-01-09 NOTE — Telephone Encounter (Signed)
-----   Message from [name], MD sent at 01/08/2021 12:22 PM EST ----- Please let pt know her bone density is same as 2020, osteopenia, continue calcium and VitD. She is scheduled for second dose zometa on 01/17/2021  [name]  01/08/2021

## 2021-01-11 ENCOUNTER — Telehealth: Payer: Self-pay | Admitting: *Deleted

## 2021-01-11 NOTE — Telephone Encounter (Signed)
Called once and spoke with the patient to schedule a new patient appt with Dr Berline Lopes; patient was to call the office back. Patient hasn't call the office back. Called the patient on 11/21 and today, leaving messages to call the office back to schedule a new patient appt

## 2021-01-13 ENCOUNTER — Other Ambulatory Visit: Payer: BC Managed Care – PPO

## 2021-01-13 ENCOUNTER — Ambulatory Visit: Payer: BC Managed Care – PPO | Admitting: Hematology

## 2021-01-13 ENCOUNTER — Ambulatory Visit: Payer: BC Managed Care – PPO

## 2021-01-13 NOTE — Telephone Encounter (Signed)
Called and spoke with the patient's husband; left a message to call the office back to schedule a new patent appt

## 2021-01-15 ENCOUNTER — Other Ambulatory Visit: Payer: Self-pay | Admitting: Hematology

## 2021-01-15 DIAGNOSIS — Z170 Estrogen receptor positive status [ER+]: Secondary | ICD-10-CM

## 2021-01-17 ENCOUNTER — Inpatient Hospital Stay (HOSPITAL_BASED_OUTPATIENT_CLINIC_OR_DEPARTMENT_OTHER): Payer: BC Managed Care – PPO | Admitting: Hematology

## 2021-01-17 ENCOUNTER — Inpatient Hospital Stay: Payer: BC Managed Care – PPO | Attending: Hematology

## 2021-01-17 ENCOUNTER — Other Ambulatory Visit: Payer: Self-pay

## 2021-01-17 ENCOUNTER — Inpatient Hospital Stay: Payer: BC Managed Care – PPO

## 2021-01-17 VITALS — BP 155/75 | HR 62 | Temp 97.8°F | Resp 17 | Ht 63.5 in | Wt 102.8 lb

## 2021-01-17 DIAGNOSIS — C50212 Malignant neoplasm of upper-inner quadrant of left female breast: Secondary | ICD-10-CM | POA: Insufficient documentation

## 2021-01-17 DIAGNOSIS — Z79811 Long term (current) use of aromatase inhibitors: Secondary | ICD-10-CM | POA: Diagnosis not present

## 2021-01-17 DIAGNOSIS — E2839 Other primary ovarian failure: Secondary | ICD-10-CM

## 2021-01-17 DIAGNOSIS — Z170 Estrogen receptor positive status [ER+]: Secondary | ICD-10-CM

## 2021-01-17 DIAGNOSIS — M8580 Other specified disorders of bone density and structure, unspecified site: Secondary | ICD-10-CM | POA: Diagnosis not present

## 2021-01-17 LAB — CMP (CANCER CENTER ONLY)
ALT: 25 U/L (ref 0–44)
AST: 25 U/L (ref 15–41)
Albumin: 4.3 g/dL (ref 3.5–5.0)
Alkaline Phosphatase: 74 U/L (ref 38–126)
Anion gap: 6 (ref 5–15)
BUN: 9 mg/dL (ref 8–23)
CO2: 28 mmol/L (ref 22–32)
Calcium: 8.9 mg/dL (ref 8.9–10.3)
Chloride: 105 mmol/L (ref 98–111)
Creatinine: 0.68 mg/dL (ref 0.44–1.00)
GFR, Estimated: >60 mL/min (ref >60)
Glucose, Bld: 84 mg/dL (ref 70–99)
Potassium: 3.3 mmol/L — ABNORMAL LOW (ref 3.5–5.1)
Sodium: 139 mmol/L (ref 135–145)
Total Bilirubin: 0.7 mg/dL (ref 0.3–1.2)
Total Protein: 7.7 g/dL (ref 6.5–8.1)

## 2021-01-17 LAB — CBC WITH DIFFERENTIAL (CANCER CENTER ONLY)
Abs Immature Granulocytes: 0.01 10*3/uL (ref 0.00–0.07)
Basophils Absolute: 0 10*3/uL (ref 0.0–0.1)
Basophils Relative: 1 %
Eosinophils Absolute: 0.1 10*3/uL (ref 0.0–0.5)
Eosinophils Relative: 2 %
HCT: 34.5 % — ABNORMAL LOW (ref 36.0–46.0)
Hemoglobin: 12 g/dL (ref 12.0–15.0)
Immature Granulocytes: 0 %
Lymphocytes Relative: 34 %
Lymphs Abs: 1.7 10*3/uL (ref 0.7–4.0)
MCH: 35.1 pg — ABNORMAL HIGH (ref 26.0–34.0)
MCHC: 34.8 g/dL (ref 30.0–36.0)
MCV: 100.9 fL — ABNORMAL HIGH (ref 80.0–100.0)
Monocytes Absolute: 0.5 10*3/uL (ref 0.1–1.0)
Monocytes Relative: 10 %
Neutro Abs: 2.8 10*3/uL (ref 1.7–7.7)
Neutrophils Relative %: 53 %
Platelet Count: 206 10*3/uL (ref 150–400)
RBC: 3.42 MIL/uL — ABNORMAL LOW (ref 3.87–5.11)
RDW: 12.2 % (ref 11.5–15.5)
WBC Count: 5.1 10*3/uL (ref 4.0–10.5)
nRBC: 0 % (ref 0.0–0.2)

## 2021-01-17 LAB — VITAMIN D 25 HYDROXY (VIT D DEFICIENCY, FRACTURES): Vit D, 25-Hydroxy: 97.8 ng/mL (ref 30–100)

## 2021-01-17 MED ORDER — SODIUM CHLORIDE 0.9 % IV SOLN: Freq: Once | INTRAVENOUS | Status: AC

## 2021-01-17 MED ORDER — POTASSIUM CHLORIDE 20 MEQ PO PACK
20.0000 meq | PACK | Freq: Every day | ORAL | 0 refills | Status: DC
Start: 2021-01-17 — End: 2023-06-27

## 2021-01-17 MED ORDER — ONDANSETRON 4 MG PO TBDP: 4.0000 mg | ORAL_TABLET | Freq: Three times a day (TID) | ORAL | 0 refills | Status: DC | PRN

## 2021-01-17 MED ORDER — ZOLEDRONIC ACID 4 MG/100ML IV SOLN
4.0000 mg | Freq: Once | INTRAVENOUS | Status: AC
  Administered 2021-01-17: 4 mg via INTRAVENOUS

## 2021-01-17 NOTE — Progress Notes (Signed)
[place]   [phone] [phone]   Clinic Follow up Note   Patient Care Team: Pomposini, Cherly Anderson, MD as PCP - General (Internal Medicine) [name], MD as Consulting Physician (Hematology) Stark Klein, MD as Consulting Physician (General Surgery) Kyung Rudd, MD as Consulting Physician (Radiation Oncology) Gardenia Phlegm, NP as Nurse Practitioner (Hematology and Oncology)  Date of Service:  01/17/2021  CHIEF COMPLAINT: f/u of left breast cancer  CURRENT THERAPY:  Letrozole 2.40m once daily starting 09/2016  ASSESSMENT & PLAN:  Sally Smith a 60y.o. female with   1. 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 She is s/p left breast lumpectomy and radiation. She is currently on adjuvant Letrozole since 09/2016. Tolerating well, plan for 5-7 years and managing her mood swings, vaginal dryness, low libido, hair thinning and osteopenia. Manageable on Wellbutrin.  -Given brown lesions near left chest wall incision, she underwent incisional biopsy on 05/17/20. Path was benign.  -most recent MM from 09/28/20 was benign. -aside from her continued chest wall tenderness, she is clinically doing well. Lab reviewed, her CBC and CMP are overall stable. Her physical exam was unremarkable. There is no clinical concern for recurrence. -she has established care with Dr. DMarla Roe She is considering reconstruction to help with wound care. -Continue surveillance. Next mammogram due 09/2021. -Continue Letrozole.  -F/u in 6 months    2. Left chest wall pain and tenderness   -her postoperative course has been complicated by slow-healing wounds. Her last surgery for removal of skin lesions took at least 7 months to heal (see photo below) -she continues to have severe tenderness   3. Osteopenia  -Her 11/2018 DEXA  shows increased osteopenia with T-score -2.1 at right hip.  -I started her on Zometa q662monthfor 2 years on 01/20/20. I encouraged her to maintain her dental health.  -She will continue Tums and vitamin D supplements. Will check VitD level every 6 months  -last DEXA on 01/02/21 was stable from prior.    4. Genetic Testing was negative     5. Anxiety -worsened on AI -She use Ambien at night to help her sleep as needed.  -she also has a therapy dog. -Mood much improved on Wellbutrin (started 06/01/19). Will continue.    6. Macrocytosis without anemia  -MCV has been slightly elevated, at 101.9 on 07/23/18 and at 102.8 on 06/01/19. Her 06/01/19 B12 and folate levels were normal.  -Improving. She will continue multivitamin.      PLAN -Proceed with Zometa today -Continue Letrozole -Lab, F/u, and last Zometa in 6 months    No problem-specific Assessment & Plan notes found for this encounter.   SUMMARY OF ONCOLOGIC HISTORY: Oncology History Overview Note  Cancer Staging Malignant neoplasm of upper-inner quadrant of left breast in female, estrogen receptor positive (HCKelly RidgeStaging form: Breast, AJCC 8th Edition - Clinical stage from 05/22/2016: Stage IA (cT1a, cN0, cM0, G2, ER: Positive, PR: Positive, HER2: Negative) - Signed by YaTruitt MerleMD on 05/29/2016     Malignant neoplasm of upper-inner quadrant of left breast in female, estrogen receptor positive ([place] 05/10/2016 Mammogram   Bilateral screening mammogram on 05/10/16 showed a possible distortion with calcifications in the left breast.   05/16/2016 Mammogram   Diagnostic mammogram and [place] Persistent distortion in the upper inner quadrant of the left breast possibly correlating  with the sonographic area of acoustic shadowing, measuring about 75m. UKoreaof left axilla (-)   05/22/2016 Receptors her2   ER 100%, PR 70%+, Ki67 5%   05/22/2016 Initial Biopsy   Diagnosis Breast, left, needle core biopsy, upper inner - INVASIVE AND IN SITU  LOBULAR CARCINOMA, G1-2   05/22/2016 Initial Diagnosis   Malignant neoplasm of upper-inner quadrant of left breast in female, estrogen receptor positive ([place]   05/31/2016 Genetic Testing   Genetic counseling and testing for hereditary cancer syndromes performed on 05/31/2016. Results are negative for pathogenic mutations in 46 genes analyzed by [facility]'s Common Hereditary Cancers Panel. Results are dated 06/11/2016. Genes analyzed include: APC, ATM, AXIN2, BARD1, BMPR1A, BRCA1, BRCA2, BRIP1, CDH1, CDKN2A, CHEK2, CTNNA1, DICER1, EPCAM, GREM1, HOXB13, KIT, MEN1, MLH1, MSH2, MSH3, MSH6, MUTYH, NBN, NF1, NTHL1, PALB2, PDGFRA, PMS2, POLD1, POLE, PTEN, RAD50, RAD51C, RAD51D, SDHA, SDHB, SDHC, SDHD, SMAD4, SMARCA4, STK11, TP53, TSC1, TSC2, and VHL.    07/01/2016 Oncotype testing   Oncotype testing showed reoccurance score of 11. With 10 year risk of distant reoccurrence 7% with tamoxifen alone   07/04/2016 Surgery   Left breast lumpectomy with radioactive seed x2 and sentinel lymph node biopsy by Dr. BBarry Dienes  08/08/2016 - 09/24/2016 Radiation Therapy   Patient underwent radiation with Dr.Moody   09/2016 -  Anti-estrogen oral therapy   Letrozole 2.5 mg daily starting 09/2016   05/13/2017 Mammogram   Bilateral diagnostic mammogram and left ultrasonography  IMPRESSION:  1. No suspicious mammographic or sonographic abnormalities in the areas of patient's LEFT breast pain.  2. No mammographic evidence of breast malignancy.    10/14/2017 Imaging   10/14/2017 Breast MRI IMPRESSION: No MRI evidence of malignancy in either breast. Left lumpectomy changes. No adenopathy.      INTERVAL HISTORY:  PDosha Broshearsis here for a follow up of breast cancer. She was last seen by me on 07/13/20. She presents to the clinic alone. "I had the best Thanksgiving ever!" She notes her daughter, [place], hosted. She notes her therapy dog went with them   All other  systems were reviewed with the patient and are negative.  MEDICAL HISTORY:  Past Medical History:  Diagnosis Date   Allergy    seasonal allergies   Anxiety    on meds   Breast cancer ([place] 07/03/2016   Cancer of left breast ([place]    [facility] genetic testing negative on 44/69/62  Complication of anesthesia    Vagals easily to pain   Diverticulitis    Genetic testing 06/18/2016   Ms. Rampey underwent genetic counseling and testing for hereditary cancer syndromes on 05/31/2016. Her results were negative for mutations in all 46 genes analyzed by [facility]'s 46-gene Common Hereditary Cancers Panel. Genes analyzed include: APC, ATM, AXIN2, BARD1, BMPR1A, BRCA1, BRCA2, BRIP1, CDH1, CDKN2A, CHEK2, CTNNA1, DICER1, EPCAM, GREM1, HOXB13, KIT, MEN1, MLH1, MSH2, MSH3, MSH6, MUTYH, NBN,   Glaucoma    on meds   Hyperlipidemia    on meds   Personal history of radiation therapy 2018   Left Breast Cancer   Skin cancer    left breast     SURGICAL HISTORY: Past Surgical History:  Procedure Laterality Date   BREAST EXCISIONAL BIOPSY Left 04/2020   BREAST LUMPECTOMY Left 07/03/2016   BREAST LUMPECTOMY WITH RADIOACTIVE SEED AND SENTINEL LYMPH NODE BIOPSY Left 07/04/2016   Procedure: LEFT BREAST LUMPECTOMY WITH RADIOACTIVE SEED X 2 AND SENTINEL LYMPH NODE  BIOPSY;  Surgeon: Stark Klein, MD;  Location: [facility];  Service: General;  Laterality: Left;   SKIN BIOPSY Left 05/17/2020   Procedure: INCISIONAL BIOPSY LEFT  CHEST WALL MASS;  Surgeon: Stark Klein, MD;  Location: [facility];  Service: General;  Laterality: Left;   surgery on right leg     [place]    I have reviewed the social history and family history with the patient and they are unchanged from previous note.  ALLERGIES:  is allergic to bee venom, elemental sulfur, erythromycin base, latex, and contrast media [iodinated diagnostic agents].  MEDICATIONS:  Current  Outpatient Medications  Medication Sig Dispense Refill   potassium chloride (KLOR-CON) 20 MEQ packet Take 20 mEq by mouth daily. 20 packet 0   acetaminophen (TYLENOL) 500 MG tablet Take 1,000 mg by mouth every 6 (six) hours as needed for mild pain, moderate pain, fever or headache.     ALPRAZolam (XANAX) 0.25 MG tablet Take 0.25 mg by mouth at bedtime as needed for anxiety.     aspirin EC 81 MG tablet Take 81 mg by mouth daily. Swallow whole.     atorvastatin (LIPITOR) 20 MG tablet Take 20 mg by mouth daily.     buPROPion (WELLBUTRIN XL) 150 MG 24 hr tablet TAKE 1 TABLET BY MOUTH EVERY DAY. 30 tablet 0   Calcium Carb-Cholecalciferol (CALCIUM-VITAMIN D) 500-200 MG-UNIT tablet Take 1 tablet by mouth daily.      cholecalciferol (VITAMIN D) 1000 units tablet Take 1,000 Units by mouth daily.     Docusate Sodium (STOOL SOFTENER LAXATIVE PO) Take 1 tablet by mouth daily at 6 (six) AM.     EPINEPHrine 0.3 mg/0.3 mL IJ SOAJ injection Inject into the muscle as needed.     hyaluronate sodium (RADIAPLEXRX) GEL APPLY TOPICALLY ONCE AS NEEDED FOR UP TO 1 DOSE 170 g 0   ibuprofen (ADVIL,MOTRIN) 200 MG tablet Take 400 mg by mouth every 6 (six) hours as needed for fever, headache, mild pain, moderate pain or cramping.     latanoprost (XALATAN) 0.005 % ophthalmic solution Place 1 drop into both eyes at bedtime.     letrozole (FEMARA) 2.5 MG tablet TAKE 1 TABLET BY MOUTH ONCE A DAY. 90 tablet 3   LORazepam (ATIVAN) 0.5 MG tablet Take 0.5-1 tablets (0.25-0.5 mg total) at bedtime as needed by mouth for anxiety. 1 tablet po 30 minutes prior to radiation and or qHS prn anxiety (Patient not taking: Reported on 01/03/2021) 30 tablet 2   LORazepam (ATIVAN) 1 MG tablet Take 1 mg by mouth 3 (three) times daily.     ondansetron (ZOFRAN-ODT) 4 MG disintegrating tablet Take 1 tablet (4 mg total) by mouth every 8 (eight) hours as needed. for nausea 30 tablet 0   PROCTO-MED HC 2.5 % rectal cream Apply topically daily as needed.      Saccharomyces boulardii (DIGESTIVE PROBIOTIC PO) Take 1 capsule by mouth daily at 6 (six) AM.     traMADol (ULTRAM) 50 MG tablet Take 1 tablet (50 mg total) by mouth every 12 (twelve) hours as needed. 20 tablet 1   zoledronic acid (RECLAST) 5 MG/100ML SOLN injection Inject 5 mg into the vein. EVERY 6MOS (due in May/Nov)     zolpidem (AMBIEN) 10 MG tablet Take 5 mg by mouth at bedtime as needed for sleep.     No current facility-administered medications for this visit.    PHYSICAL EXAMINATION: ECOG  PERFORMANCE STATUS: 1 - Symptomatic but completely ambulatory  Vitals:   01/17/21 1019  BP: (!) 155/75  Pulse: 62  Resp: 17  Temp: 97.8 F (36.6 C)  SpO2: 100%   Wt Readings from Last 3 Encounters:  01/17/21 102 lb 12.8 oz (46.6 kg)  01/03/21 101 lb 9.6 oz (46.1 kg)  07/13/20 107 lb 4.8 oz (48.7 kg)     GENERAL:alert, no distress and comfortable SKIN: skin color, texture, turgor are normal, no rashes or significant lesions EYES: normal, Conjunctiva are pink and non-injected, sclera clear  NECK: supple, thyroid normal size, non-tender, without nodularity LYMPH:  no palpable lymphadenopathy in the cervical, axillary  LUNGS: clear to auscultation and percussion with normal breathing effort HEART: regular rate & rhythm and no murmurs and no lower extremity edema ABDOMEN:abdomen soft, non-tender and normal bowel sounds Musculoskeletal:no cyanosis of digits and no clubbing  NEURO: alert & oriented x 3 with fluent speech, no focal motor/sensory deficits BREAST: tenderness to the healed wound on left chest wall. No palpable breast mass or adenopathy.     LABORATORY DATA:  I have reviewed the data as listed CBC Latest Ref Rng & Units 01/17/2021 07/13/2020 01/20/2020  WBC 4.0 - 10.5 K/uL 5.1 6.0 6.2  Hemoglobin 12.0 - 15.0 g/dL 12.0 13.7 13.4  Hematocrit 36.0 - 46.0 % 34.5(L) 38.7 38.3  Platelets 150 - 400 K/uL 206 180 177     CMP Latest Ref Rng & Units 01/17/2021 07/13/2020  01/20/2020  Glucose 70 - 99 mg/dL 84 78 84  BUN 8 - 23 mg/dL _0 Creatinine 0.44 - 1.00 mg/dL 0.68 0.71 0.71  Sodium 135 - 145 mmol/L 139 141 140  Potassium 3.5 - 5.1 mmol/L 3.3(L) 3.7 3.4(L)  Chloride 98 - 111 mmol/L 105 104 105  CO2 22 - 32 mmol/L _1 Calcium 8.9 - 10.3 mg/dL 8.9 9.7 10.0  Total Protein 6.5 - 8.1 g/dL 7.7 8.2(H) 8.3(H)  Total Bilirubin 0.3 - 1.2 mg/dL 0.7 0.6 0.5  Alkaline Phos 38 - 126 U/L 74 78 85  AST 15 - 41 U/L 25 37 23  ALT 0 - 44 U/L 25 35 20      RADIOGRAPHIC STUDIES: I have personally reviewed the radiological images as listed and agreed with the findings in the report. No results found.    No orders of the defined types were placed in this encounter.  All questions were answered. The patient knows to call the clinic with any problems, questions or concerns. No barriers to learning was detected. The total time spent in the appointment was 30 minutes.     [name], MD 01/17/2021   I, Wilburn Mylar, am acting as scribe for [name], MD.   I have reviewed the above documentation for accuracy and completeness, and I agree with the above.

## 2021-01-17 NOTE — Patient Instructions (Signed)
[facility]  Discharge Instructions: Thank you for choosing [facility] to provide your oncology and hematology care.   If you have a lab appointment with the [facility], please go directly to the [facility] and check in at the registration area.   Wear comfortable clothing and clothing appropriate for easy access to any Portacath or PICC line.   We strive to give you quality time with your provider. You may need to reschedule your appointment if you arrive late (15 or more minutes).  Arriving late affects you and other patients whose appointments are after yours.  Also, if you miss three or more appointments without notifying the office, you may be dismissed from the clinic at the provider's discretion.      For prescription refill requests, have your pharmacy contact our office and allow 72 hours for refills to be completed.    Today you received the following chemotherapy and/or immunotherapy agent: Zometa     To help prevent nausea and vomiting after your treatment, we encourage you to take your nausea medication as directed.  BELOW ARE SYMPTOMS THAT SHOULD BE REPORTED IMMEDIATELY: *FEVER GREATER THAN 100.4 F (38 C) OR HIGHER *CHILLS OR SWEATING *NAUSEA AND VOMITING THAT IS NOT CONTROLLED WITH YOUR NAUSEA MEDICATION *UNUSUAL SHORTNESS OF BREATH *UNUSUAL BRUISING OR BLEEDING *URINARY PROBLEMS (pain or burning when urinating, or frequent urination) *BOWEL PROBLEMS (unusual diarrhea, constipation, pain near the anus) TENDERNESS IN MOUTH AND THROAT WITH OR WITHOUT PRESENCE OF ULCERS (sore throat, sores in mouth, or a toothache) UNUSUAL RASH, SWELLING OR PAIN  UNUSUAL VAGINAL DISCHARGE OR ITCHING   Items with * indicate a potential emergency and should be followed up as soon as possible or go to the Emergency Department if any problems should occur.  Please show the CHEMOTHERAPY ALERT CARD or IMMUNOTHERAPY ALERT CARD at check-in to the  Emergency Department and triage nurse.  Should you have questions after your visit or need to cancel or reschedule your appointment, please contact [facility]  Dept: [phone]  and follow the prompts.  Office hours are 8:00 a.m. to 4:30 p.m. Monday - Friday. Please note that voicemails left after 4:00 p.m. may not be returned until the following business day.  We are closed weekends and major holidays. You have access to a nurse at all times for urgent questions. Please call the main number to the clinic Dept: [phone] and follow the prompts.   For any non-urgent questions, you may also contact your provider using MyChart. We now offer e-Visits for anyone 23 and older to request care online for non-urgent symptoms. For details visit [email or web address].   Also download the MyChart app! Go to the app store, search "MyChart", open the app, select [facility], and log in with your MyChart username and password.  Due to Covid, a mask is required upon entering the hospital/clinic. If you do not have a mask, one will be given to you upon arrival. For doctor visits, patients may have 1 support person aged 47 or older with them. For treatment visits, patients cannot have anyone with them due to current Covid guidelines and our immunocompromised population.

## 2021-01-18 ENCOUNTER — Telehealth: Payer: Self-pay | Admitting: Hematology

## 2021-01-18 ENCOUNTER — Encounter: Payer: Self-pay | Admitting: Hematology

## 2021-01-18 NOTE — Telephone Encounter (Signed)
Patient called the office back and scheduled new patient appt on 12/9 at 10:30 am. Patient given the arrival time of 10 am. Patient aware of the address and phone number for the clinic.

## 2021-01-18 NOTE — Telephone Encounter (Signed)
Late entry----called the patient yesterday and LMOM to call the office   Called the patient today and LMOM to call the office

## 2021-01-18 NOTE — Telephone Encounter (Signed)
Scheduled follow-up appointment per 11/29 los. Patient is aware.

## 2021-01-25 ENCOUNTER — Other Ambulatory Visit: Payer: Self-pay

## 2021-01-25 DIAGNOSIS — I6529 Occlusion and stenosis of unspecified carotid artery: Secondary | ICD-10-CM

## 2021-01-26 ENCOUNTER — Encounter: Payer: Self-pay | Admitting: Gynecologic Oncology

## 2021-01-26 NOTE — Progress Notes (Signed)
GYNECOLOGIC ONCOLOGY NEW PATIENT CONSULTATION   Patient Name: Sally Smith  Patient Age: 60 y.o. Date of Service: 01/27/21 Referring Provider: Stark Klein MD  Primary Care Provider: Harmon Pier Cherly Anderson, MD Consulting Provider: Jeral Pinch, MD   Assessment/Plan:  Post menopausal patient with history of estrogen receptor positive breast cancer and remote history of cervical dysplasia presenting to discuss surgical options including BSO and hysterectomy.  Reviewed patient's malignancy history, notably her breast cancer history. This was estrogen and Progesterone receptor positive. Patient was menopausal at the time of diagnosis and has been on letrozole for anti-estrogen therapy. From a risk reduction standpoint, we discussed that despite her negative genetic testing, patients with a personal history of breast cancer, likely have a slight increase risk of ovarian cancer. This seems to be especially true in patients with breast cancer diagnosis at an earlier age. Given her menopausal status, I think there is much less to gain from a therapeutic BSO in the setting of estrogen receptor positive cancer. I do think it's reasonable though, given her personal history, to proceed with BSO if she elects to do so. I reviewed that there are no screening testings for ovarian cancer.  In terms of uterine cancer, the patient does not have significant risk factors herself for developing uterine cancer. Additionally she does not have a genetic mutation that is known to increase the risk of uterine cancer. We discussed that sometimes hysterectomies are performed in the setting of need for hormonal therapy after removal of the tubes and ovaries. Given that she is postmenopausal and on aromatase inhibitor, hysterectomy for this reason would not be necessary.  Although I cannot see the records in epic, per her report, it sounds like she has a remote history of cervical dysplasia. Pap smears have been  normal for the last 20+ years. I recommended continued Pap smear surveillance until the age of 62 per ASCCP guidelines.  Patient voiced concerns about her decreased libido as well as vaginal dryness. She would very much like to be more intimate with her husband. We discussed some non-hormonal interventions that may be of help. While vaginal estrogen can be effective for vaginal symptoms related to menopause, her medical oncologist may prefer that she not be on vaginal estrogen despite its low systemic absorption.    A copy of this note was sent to the patient's referring provider.   90 minutes of total time was spent for this patient encounter, including preparation, face-to-face counseling with the patient and coordination of care, and documentation of the encounter.   Jeral Pinch, MD  Division of Gynecologic Oncology  Department of Obstetrics and Gynecology  [facility]  ___________________________________________  Chief Complaint: Chief Complaint  Patient presents with   Malignant neoplasm of upper-inner quadrant of left breast i    History of Present Illness:  Sally Smith is a 60 y.o. y.o. female who is seen in consultation at the request of Dr. Barry Dienes for an evaluation of possible surgery in the setting of a history of ER+ breast cancer.  History of ER/PR+ breast cancer diagnosed in 2018. Currently on Letrozole.  Genetic testing was negative.  Patient reports a remote history of what sounds like cervical dysplasia in her 47s.  After several abnormal Pap smears, she remembers that her gynecologist recommended hysterectomy.  She saw Dr. Fransisca Connors at [facility] and underwent a surgery (sounds like a cervical conization) and did not require further treatment.  She had normal Paps subsequently but then sometime after  her children were born and more than 25 years ago, she thinks she had maybe another Pap or 2 that were abnormal.  Since that time, she  reports all normal Pap smears.  She also has a history of skin cancer.  Family history is notable for breast cancer in her mother.  Her mother had a hysterectomy in her 20s but the patient is not aware that she had any gynecologic cancer diagnosis.  Patient lives in [place] with her husband.  Treatment History: Oncology History Overview Note  Cancer Staging Malignant neoplasm of upper-inner quadrant of left breast in female, estrogen receptor positive ([facility]) Staging form: Breast, AJCC 8th Edition - Clinical stage from 05/22/2016: Stage IA (cT1a, cN0, cM0, G2, ER: Positive, PR: Positive, HER2: Negative) - Signed by [name], MD on 05/29/2016     Malignant neoplasm of upper-inner quadrant of left breast in female, estrogen receptor positive ([facility])  05/10/2016 Mammogram   Bilateral screening mammogram on 05/10/16 showed a possible distortion with calcifications in the left breast.   05/16/2016 Mammogram   Diagnostic mammogram and US showed Persistent distortion in the upper inner quadrant of the left breast possibly correlating with the sonographic area of acoustic shadowing, measuring about 32m. UKoreaof left axilla (-)   05/22/2016 Receptors her2   ER 100%, PR 70%+, Ki67 5%   05/22/2016 Initial Biopsy   Diagnosis Breast, left, needle core biopsy, upper inner - INVASIVE AND IN SITU LOBULAR CARCINOMA, G1-2   05/22/2016 Initial Diagnosis   Malignant neoplasm of upper-inner quadrant of left breast in female, estrogen receptor positive ([place]   05/31/2016 Genetic Testing   Genetic counseling and testing for hereditary cancer syndromes performed on 05/31/2016. Results are negative for pathogenic mutations in 46 genes analyzed by [employer]'s Common Hereditary Cancers Panel. Results are dated 06/11/2016. Genes analyzed include: APC, ATM, AXIN2, BARD1, BMPR1A, BRCA1, BRCA2, BRIP1, CDH1, CDKN2A, CHEK2, CTNNA1, DICER1, EPCAM, GREM1, HOXB13, KIT, MEN1, MLH1, MSH2, MSH3, MSH6, MUTYH, NBN, NF1, NTHL1, PALB2, PDGFRA,  PMS2, POLD1, POLE, PTEN, RAD50, RAD51C, RAD51D, SDHA, SDHB, SDHC, SDHD, SMAD4, SMARCA4, STK11, TP53, TSC1, TSC2, and VHL.    07/01/2016 Oncotype testing   Oncotype testing showed reoccurance score of 11. With 10 year risk of distant reoccurrence 7% with tamoxifen alone   07/04/2016 Surgery   Left breast lumpectomy with radioactive seed x2 and sentinel lymph node biopsy by Dr. BBarry Dienes  08/08/2016 - 09/24/2016 Radiation Therapy   Patient underwent radiation with Dr.Moody   09/2016 -  Anti-estrogen oral therapy   Letrozole 2.5 mg daily starting 09/2016   05/13/2017 Mammogram   Bilateral diagnostic mammogram and left ultrasonography  IMPRESSION:  1. No suspicious mammographic or sonographic abnormalities in the areas of patient's LEFT breast pain.  2. No mammographic evidence of breast malignancy.    10/14/2017 Imaging   10/14/2017 Breast MRI IMPRESSION: No MRI evidence of malignancy in either breast. Left lumpectomy changes. No adenopathy.    PAST MEDICAL HISTORY:  Past Medical History:  Diagnosis Date   Allergy    seasonal allergies   Anxiety    on meds   [place]   [place]cer of [place] breast ([facility]    [facility] genetic testing negative on 40/17/79  Complication of anesthesia    Vagals easily to pain   Diverticulitis    Genetic testing 06/18/2016   Ms. Shipper underwent genetic counseling and testing for hereditary cancer syndromes on 05/31/2016. Her results were negative for mutations in all 46 genes analyzed by [facility]'s  46-gene Common Hereditary Cancers Panel. Genes analyzed include: APC, ATM, AXIN2, BARD1, BMPR1A, BRCA1, BRCA2, BRIP1, CDH1, CDKN2A, CHEK2, CTNNA1, DICER1, EPCAM, GREM1, HOXB13, KIT, MEN1, MLH1, MSH2, MSH3, MSH6, MUTYH, NBN,   Glaucoma    on meds   Hyperlipidemia    on meds   Personal history of radiation therapy 2018   Left Breast Cancer   Skin cancer    left breast      PAST SURGICAL HISTORY:  Past Surgical History:  Procedure Laterality  Date   BREAST EXCISIONAL BIOPSY Left 04/2020   BREAST LUMPECTOMY Left 07/03/2016   BREAST LUMPECTOMY WITH RADIOACTIVE SEED AND SENTINEL LYMPH NODE BIOPSY Left 07/04/2016   Procedure: LEFT BREAST LUMPECTOMY WITH RADIOACTIVE SEED X 2 AND SENTINEL LYMPH NODE BIOPSY;  Surgeon: Stark Klein, MD;  Location: [facility];  Service: General;  Laterality: Left;   EYE SURGERY Bilateral 11/25/2020   cataract surgery   SKIN BIOPSY Left 05/17/2020   Procedure: INCISIONAL BIOPSY LEFT  CHEST WALL MASS;  Surgeon: Stark Klein, MD;  Location: [facility];  Service: General;  Laterality: Left;   surgery on right leg     TONSILLECTOMY     WISDOM TOOTH EXTRACTION  1985    OB/GYN HISTORY:  OB History  Gravida Para Term Preterm AB Living  _0 SAB IAB Ectopic Multiple Live Births               # Outcome Date GA Lbr Len/2nd Weight Sex Delivery Anes PTL Lv  3 Gravida           2 Para           1 Para             Patient's last menstrual period was 11/29/2006 (exact date).  Age at menarche: 14 Age at menopause: 57 Hx of HRT: Currently on letrozole, denies any HRT Hx of STDs: Denies Last pap: 2022 History of abnormal pap smears: Yes, see HPI  SCREENING STUDIES:  Last mammogram: 09/2020  Last colonoscopy: 06/2020 Last bone mineral density: 12/2020  MEDICATIONS: Outpatient Encounter Medications as of 01/27/2021  Medication Sig   acetaminophen (TYLENOL) 500 MG tablet Take 1,000 mg by mouth every 6 (six) hours as needed for mild pain, moderate pain, fever or headache.   ALPRAZolam (XANAX) 0.25 MG tablet Take 0.25 mg by mouth at bedtime as needed for anxiety.   aspirin EC 81 MG tablet Take 81 mg by mouth daily. Swallow whole.   atorvastatin (LIPITOR) 20 MG tablet Take 20 mg by mouth daily.   buPROPion (WELLBUTRIN XL) 150 MG 24 hr tablet TAKE 1 TABLET BY MOUTH EVERY DAY.   Calcium Carb-Cholecalciferol (CALCIUM-VITAMIN D) 500-200 MG-UNIT tablet Take 1 tablet by mouth  daily.    cholecalciferol (VITAMIN D) 1000 units tablet Take 1,000 Units by mouth daily.   Docusate Sodium (STOOL SOFTENER LAXATIVE PO) Take 1 tablet by mouth daily at 6 (six) AM.   hyaluronate sodium (RADIAPLEXRX) GEL APPLY TOPICALLY ONCE AS NEEDED FOR UP TO 1 DOSE   ibuprofen (ADVIL,MOTRIN) 200 MG tablet Take 400 mg by mouth every 6 (six) hours as needed for fever, headache, mild pain, moderate pain or cramping.   latanoprost (XALATAN) 0.005 % ophthalmic solution Place 1 drop into both eyes at bedtime.   letrozole (FEMARA) 2.5 MG tablet TAKE 1 TABLET BY MOUTH ONCE A DAY.   LORazepam (ATIVAN) 1 MG tablet Take 1 mg by mouth 3 (  three) times daily.   ondansetron (ZOFRAN-ODT) 4 MG disintegrating tablet Take 1 tablet (4 mg total) by mouth every 8 (eight) hours as needed. for nausea   potassium chloride (KLOR-CON) 20 MEQ packet Take 20 mEq by mouth daily.   PROCTO-MED HC 2.5 % rectal cream Apply topically daily as needed.   Saccharomyces boulardii (DIGESTIVE PROBIOTIC PO) Take 1 capsule by mouth daily at 6 (six) AM.   traMADol (ULTRAM) 50 MG tablet Take 1 tablet (50 mg total) by mouth every 12 (twelve) hours as needed.   zoledronic acid (RECLAST) 5 MG/100ML SOLN injection Inject 5 mg into the vein. EVERY 6MOS (due in May/Nov)   zolpidem (AMBIEN) 10 MG tablet Take 5 mg by mouth at bedtime as needed for sleep.   EPINEPHrine 0.3 mg/0.3 mL IJ SOAJ injection Inject into the muscle as needed. (Patient not taking: Reported on 01/26/2021)   LORazepam (ATIVAN) 0.5 MG tablet Take 0.5-1 tablets (0.25-0.5 mg total) at bedtime as needed by mouth for anxiety. 1 tablet po 30 minutes prior to radiation and or qHS prn anxiety (Patient not taking: Reported on 01/26/2021)   No facility-administered encounter medications on file as of 01/27/2021.    ALLERGIES:  Allergies  Allergen Reactions   Bee Venom Anaphylaxis   Elemental Sulfur Hives    itching   Erythromycin Base    Latex Itching    welps   Sulfa Antibiotics  Hives and Swelling   Wound Dressing Adhesive    Contrast Media [Iodinated Diagnostic Agents] Rash     FAMILY HISTORY:  Family History  Problem Relation Age of Onset   Breast cancer Mother 11       Contralateral breast cancer a few years later that metastasized.   Colon cancer Neg Hx    Ovarian cancer Neg Hx    Endometrial cancer Neg Hx    Pancreatic cancer Neg Hx    Prostate cancer Neg Hx      SOCIAL HISTORY:  Social Connections: Not on file    REVIEW OF SYSTEMS:  Pertinent positives include healing wound on left chest. Denies appetite changes, fevers, chills, fatigue, unexplained weight changes. Denies hearing loss, neck lumps or masses, mouth sores, ringing in ears or voice changes. Denies cough or wheezing.  Denies shortness of breath. Denies chest pain or palpitations. Denies leg swelling. Denies abdominal distention, pain, blood in stools, constipation, diarrhea, nausea, vomiting, or early satiety. Denies pain with intercourse, dysuria, frequency, hematuria or incontinence. Denies hot flashes, pelvic pain, vaginal bleeding or vaginal discharge.   Denies joint pain, back pain or muscle pain/cramps. Denies itching, rash. Denies dizziness, headaches, numbness or seizures. Denies swollen lymph nodes or glands, denies easy bruising or bleeding. Denies anxiety, depression, confusion, or decreased concentration.  Physical Exam:  Vital Signs for this encounter:  Blood pressure 112/60, pulse 82, temperature 98 F (36.7 C), temperature source Oral, resp. rate 16, height $RemoveBe'5\' 3"'EVxSjcoJs$  (1.6 m), weight 100 lb 4.8 oz (45.5 kg), last menstrual period 11/29/2006, SpO2 100 %. Body mass index is 17.77 kg/m. General: Alert, oriented, no acute distress.  HEENT: Normocephalic, atraumatic. Sclera anicteric.  Chest: Clear to auscultation bilaterally. No wheezes, rhonchi, or rales. Cardiovascular: Regular rate and rhythm, no murmurs, rubs, or gallops.  Abdomen: Normoactive bowel sounds. Soft,  nondistended, nontender to palpation. No masses or hepatosplenomegaly appreciated. No palpable fluid wave.  Extremities: Grossly normal range of motion. Warm, well perfused. No edema bilaterally.  Skin: No rashes or lesions.  Lymphatics: No cervical, supraclavicular, or inguinal adenopathy.  GU: Normal external female genitalia.  No lesions.  Vaginal mucosa is moderately atrophic, no lesions or discharge.  Cervix is normal in appearance and flush with the vaginal apex.  No lesions noted.  On bimanual exam, uterus is small and mobile, no parametrial involvement or nodularity.  No adnexal masses appreciated.  Rectovaginal exam confirms these findings.  LABORATORY AND RADIOLOGIC DATA:  Outside medical records were reviewed to synthesize the above history, along with the history and physical obtained during the visit.   Lab Results  Component Value Date   WBC 5.1 01/17/2021   HGB 12.0 01/17/2021   HCT 34.5 (L) 01/17/2021   PLT 206 01/17/2021   GLUCOSE 84 01/17/2021   ALT 25 01/17/2021   AST 25 01/17/2021   NA 139 01/17/2021   K 3.3 (L) 01/17/2021   CL 105 01/17/2021   CREATININE 0.68 01/17/2021   BUN 9 01/17/2021   CO2 28 01/17/2021

## 2021-01-26 NOTE — Progress Notes (Signed)
[address]       [place] [address]             [phone]                    [name] [facility] Medical Record #[id number] Date of Birth: 1961/02/01  Referring: Wallace Going, DO Primary Care: Clinton Quant, MD Primary Cardiologist: None  Chief Complaint:    Chief Complaint  Patient presents with  . chest wall wound    Surgical consult, Chest CT 05/05/20    History of Present Illness:    Sally Smith 60 y.o. female referred by Dr. Marla Roe in for a left chest wall postradiation wound.  She does have a history of breast cancer and over the last several years has had a chronic wound.  She did undergo biopsy of this which was negative.  She comes in today to discuss surgical planning for chest wall resection in combination with plastic surgery.      Past Medical History:  Diagnosis Date  . Allergy    seasonal allergies  . Anxiety    on meds  . Breast cancer ([facility]) 07/03/2016  . Cancer of left breast ([facility])    [facility] genetic testing negative on 05/31/16  . Complication of anesthesia    Vagals easily to pain  . Diverticulitis   . Genetic testing 06/18/2016   Ms. Mchaney underwent genetic counseling and testing for hereditary cancer syndromes on 05/31/2016. Her results were negative for mutations in all 46 genes analyzed by [facility]'s 46-gene Common Hereditary Cancers Panel. Genes analyzed include: APC, ATM, AXIN2, BARD1, BMPR1A, BRCA1, BRCA2, BRIP1, CDH1, CDKN2A, CHEK2, CTNNA1, DICER1, EPCAM, GREM1, HOXB13, KIT, MEN1, MLH1, MSH2, MSH3, MSH6, MUTYH, NBN,  . Glaucoma    on meds  . Hyperlipidemia    on meds  . Personal history of radiation therapy 2018   Left Breast Cancer  . Skin cancer    left breast     Past Surgical History:  Procedure Laterality Date  . BREAST EXCISIONAL BIOPSY Left 04/2020  . BREAST LUMPECTOMY Left 07/03/2016  . BREAST LUMPECTOMY WITH RADIOACTIVE SEED AND SENTINEL LYMPH NODE BIOPSY  Left 07/04/2016   Procedure: LEFT BREAST LUMPECTOMY WITH RADIOACTIVE SEED X 2 AND SENTINEL LYMPH NODE BIOPSY;  Surgeon: Stark Klein, MD;  Location: [facility];  Service: General;  Laterality: Left;  . EYE SURGERY Bilateral 11/25/2020   cataract surgery  . SKIN BIOPSY Left 05/17/2020   Procedure: INCISIONAL BIOPSY LEFT  CHEST WALL MASS;  Surgeon: Stark Klein, MD;  Location: [facility];  Service: General;  Laterality: Left;  . surgery on right leg    . TONSILLECTOMY    . WISDOM TOOTH EXTRACTION  1985    Family History  Problem Relation Age of Onset  . Breast cancer Mother 51       Contralateral breast cancer a few years later that metastasized.  . Colon cancer Neg Hx   . Ovarian cancer Neg Hx   . Endometrial cancer Neg Hx   . Pancreatic cancer Neg Hx   . Prostate cancer Neg Hx      Social History   Tobacco Use  Smoking Status Never  Smokeless Tobacco Never    Social History   Substance and Sexual Activity  Alcohol Use Yes   Comment: rare     Allergies  Allergen Reactions  . Bee Venom Anaphylaxis  . Elemental  Sulfur Hives    itching  . Erythromycin Base   . Latex Itching    welps  . Sulfa Antibiotics Hives and Swelling  . Wound Dressing Adhesive   . Contrast Media [Iodinated Diagnostic Agents] Rash    Current Outpatient Medications  Medication Sig Dispense Refill  . ALPRAZolam (XANAX) 0.25 MG tablet Take 0.25 mg by mouth at bedtime as needed for anxiety.    Marland Kitchen aspirin EC 81 MG tablet Take 81 mg by mouth daily. Swallow whole.    Marland Kitchen atorvastatin (LIPITOR) 20 MG tablet Take 20 mg by mouth daily.    Marland Kitchen buPROPion (WELLBUTRIN XL) 150 MG 24 hr tablet TAKE 1 TABLET BY MOUTH EVERY DAY. 30 tablet 0  . Calcium Carb-Cholecalciferol (CALCIUM-VITAMIN D) 500-200 MG-UNIT tablet Take 1 tablet by mouth daily.     . cholecalciferol (VITAMIN D) 1000 units tablet Take 1,000 Units by mouth daily.    Mariane Baumgarten Sodium (STOOL SOFTENER LAXATIVE PO) Take 1  tablet by mouth daily at 6 (six) AM.    . EPINEPHrine 0.3 mg/0.3 mL IJ SOAJ injection Inject into the muscle as needed.    . hyaluronate sodium (RADIAPLEXRX) GEL APPLY TOPICALLY ONCE AS NEEDED FOR UP TO 1 DOSE 170 g 0  . latanoprost (XALATAN) 0.005 % ophthalmic solution Place 1 drop into both eyes at bedtime.    Marland Kitchen letrozole (FEMARA) 2.5 MG tablet TAKE 1 TABLET BY MOUTH ONCE A DAY. 90 tablet 3  . LORazepam (ATIVAN) 1 MG tablet Take 1 mg by mouth 3 (three) times daily.    . ondansetron (ZOFRAN-ODT) 4 MG disintegrating tablet Take 1 tablet (4 mg total) by mouth every 8 (eight) hours as needed. for nausea 30 tablet 0  . potassium chloride (KLOR-CON) 20 MEQ packet Take 20 mEq by mouth daily. 20 packet 0  . PROCTO-MED HC 2.5 % rectal cream Apply topically daily as needed.    . Saccharomyces boulardii (DIGESTIVE PROBIOTIC PO) Take 1 capsule by mouth daily at 6 (six) AM.    . traMADol (ULTRAM) 50 MG tablet Take 1 tablet (50 mg total) by mouth every 12 (twelve) hours as needed. 20 tablet 1  . zoledronic acid (RECLAST) 5 MG/100ML SOLN injection Inject 5 mg into the vein. EVERY 6MOS (due in May/Nov)    . zolpidem (AMBIEN) 10 MG tablet Take 5 mg by mouth at bedtime as needed for sleep.    Marland Kitchen acetaminophen (TYLENOL) 500 MG tablet Take 1,000 mg by mouth every 6 (six) hours as needed for mild pain, moderate pain, fever or headache. (Patient not taking: Reported on 01/27/2021)    . ibuprofen (ADVIL,MOTRIN) 200 MG tablet Take 400 mg by mouth every 6 (six) hours as needed for fever, headache, mild pain, moderate pain or cramping. (Patient not taking: Reported on 01/27/2021)     No current facility-administered medications for this visit.    Review of Systems  Constitutional:  Negative for malaise/fatigue.  Cardiovascular:  Positive for chest pain.  Musculoskeletal:  Positive for myalgias.  Neurological: Negative.    PHYSICAL EXAMINATION: BP 110/64 (BP Location: Right Arm, Patient Position: Sitting)   Pulse 79    Resp 20   Ht 5' 3" (1.6 m)   Wt 100 lb 8 oz (45.6 kg)   LMP 11/29/2006 (Exact Date)   SpO2 100% Comment: RA  BMI 17.80 kg/m   Physical Exam Constitutional:      General: She is not in acute distress.    Appearance: Normal appearance. She is not ill-appearing.  HENT:     Head: Normocephalic and atraumatic.  Eyes:     Extraocular Movements: Extraocular movements intact.  Cardiovascular:     Rate and Rhythm: Normal rate.  Chest:    Musculoskeletal:     Cervical back: Normal range of motion.  Neurological:     Mental Status: She is alert.     Diagnostic Studies & Laboratory data:     Recent Radiology Findings:   DG Bone Density  Result Date: 01/02/2021 EXAM: DUAL X-RAY ABSORPTIOMETRY (DXA) FOR BONE MINERAL DENSITY IMPRESSION: Referring Physician:  [name] Your patient completed a bone mineral density test using GE Lunar iDXA system (analysis version: 16). Technologist: Andrews PATIENT: Name: Devlin, Mcveigh Patient ID: [id number] Birth Date: 1961-01-25 Height: 62.0 in. Sex: Female Measured: 01/02/2021 Weight: 100.6 lbs. Indications: Breast Cancer History, Caucasian, Estrogen Deficient, Letrozole, Postmenopausal Fractures: NONE Treatments: Calcium (E943.0), Hormone Therapy For Cancer, Reclast, Vitamin D (E933.5) ASSESSMENT: The BMD measured at Femur Neck Right is 0.744 g/cm2 with a T-score of -2.1. This patient is considered osteopenic/low bone mass according to [facility]) criteria. The quality of the exam is good. Patient does not meet criteria for FRAX due to current use of Reclast. Site Region Measured Date Measured Age YA BMD Significant CHANGE T-score AP Spine  L1-L4      01[age]02/20[age].0         -1.5    1.001 g/cm2 DualFemur Neck Right 01/02/2021    60.0         -2.1    0.744 g/cm2 DualFemur Neck Right 12/04/2018 59.9 -2.1 0.741 g/cm2 * DualFemur Total Mean 01/02/2021    62.0         -1.6    0.802 g/cm2 DualFemur Total Mean 12/04/2018 59.9 -1.6 0.801 g/cm2 *  World Health Organization [facility]) criteria for post-menopausal, Caucasian Women: Normal       T-score at or above -1 SD Osteopenia   T-score between -1 and -2.5 SD Osteoporosis T-score at or below -2.5 SD RECOMMENDATION: 1. All patients should optimize calcium and vitamin D intake. 2. Consider FDA-approved medical therapies in postmenopausal women and men aged 44 years and older, based on the following: a. A hip or vertebral (clinical or morphometric) fracture. b. T-score = -2.5 at the femoral neck or spine after appropriate evaluation to exclude secondary causes. c. Low bone mass (T-score between -1.0 and -2.5 at the femoral neck or spine) and a 10-year probability of a hip fracture = 3% or a 10-year probability of a major osteoporosis-related fracture = 20% based on the [place]-adapted WHO algorithm. d. Clinician judgment and/or patient preferences may indicate treatment for people with 10-year fracture probabilities above or below these levels. FOLLOW-UP: Patients with diagnosis of osteoporosis or at high risk for fracture should have regular bone mineral density tests.? Patients eligible for Medicare are allowed routine testing every 2 years.? The testing frequency can be increased to one year for patients who have rapidly progressing disease, are receiving or discontinuing medical therapy to restore bone mass, or have additional risk factors. I have reviewed this study and agree with the findings. [facility] Electronically Signed   By: Elmer Picker M.D.   On: 01/02/2021 11:34       I have independently reviewed the above radiology studies  and reviewed the findings with the patient.   Recent Lab Findings: Lab Results  Component Value Date   WBC 5.1 01/17/2021   HGB 12.0 01/17/2021  HCT 34.5 (L) 01/17/2021   PLT 206 01/17/2021   GLUCOSE 84 01/17/2021   ALT 25 01/17/2021   AST 25 01/17/2021   NA 139 01/17/2021   K 3.3 (L) 01/17/2021   CL 105 01/17/2021   CREATININE 0.68  01/17/2021   BUN 9 01/17/2021   CO2 28 01/17/2021        Assessment / Plan:   60 year old female with history of left breast cancer that was treated with partial mastectomy and radiation.  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  Biopsies have been negative for recurrence.  Due to the chronicity of this she will require a resection along with tissue advancement flap performed by plastic surgery.  I am available to assist with the chest wall resection.  I personally reviewed the CT scan from March 2022.  She does have a chronic tract that does appear to involve the rib.  The sternum appears uninvolved.  I would like to obtain updated cross-sectional imaging prior to surgery.  This has been ordered.  We will coordinate timing to perform this procedure.      Lajuana Matte 01/27/2021 4:12 PM

## 2021-01-27 ENCOUNTER — Inpatient Hospital Stay: Payer: BC Managed Care – PPO | Attending: Hematology | Admitting: Gynecologic Oncology

## 2021-01-27 ENCOUNTER — Institutional Professional Consult (permissible substitution) (INDEPENDENT_AMBULATORY_CARE_PROVIDER_SITE_OTHER): Payer: BC Managed Care – PPO | Admitting: Thoracic Surgery (Cardiothoracic Vascular Surgery)

## 2021-01-27 ENCOUNTER — Other Ambulatory Visit: Payer: Self-pay

## 2021-01-27 ENCOUNTER — Other Ambulatory Visit: Payer: Self-pay | Admitting: Thoracic Surgery (Cardiothoracic Vascular Surgery)

## 2021-01-27 ENCOUNTER — Encounter: Payer: Self-pay | Admitting: Gynecologic Oncology

## 2021-01-27 VITALS — BP 110/64 | HR 79 | Resp 20 | Ht 63.0 in | Wt 100.5 lb

## 2021-01-27 VITALS — BP 112/60 | HR 82 | Temp 98.0°F | Resp 16 | Ht 63.0 in | Wt 100.3 lb

## 2021-01-27 DIAGNOSIS — Z79811 Long term (current) use of aromatase inhibitors: Secondary | ICD-10-CM | POA: Insufficient documentation

## 2021-01-27 DIAGNOSIS — C50412 Malignant neoplasm of upper-outer quadrant of left female breast: Secondary | ICD-10-CM

## 2021-01-27 DIAGNOSIS — Z170 Estrogen receptor positive status [ER+]: Secondary | ICD-10-CM | POA: Insufficient documentation

## 2021-01-27 DIAGNOSIS — Z1502 Genetic susceptibility to malignant neoplasm of ovary: Secondary | ICD-10-CM | POA: Diagnosis not present

## 2021-01-27 DIAGNOSIS — Z780 Asymptomatic menopausal state: Secondary | ICD-10-CM | POA: Diagnosis not present

## 2021-01-27 DIAGNOSIS — S21109A Unspecified open wound of unspecified front wall of thorax without penetration into thoracic cavity, initial encounter: Secondary | ICD-10-CM

## 2021-01-27 DIAGNOSIS — Z87410 Personal history of cervical dysplasia: Secondary | ICD-10-CM | POA: Diagnosis not present

## 2021-01-27 DIAGNOSIS — Z803 Family history of malignant neoplasm of breast: Secondary | ICD-10-CM | POA: Diagnosis not present

## 2021-01-27 DIAGNOSIS — C50212 Malignant neoplasm of upper-inner quadrant of left female breast: Secondary | ICD-10-CM | POA: Insufficient documentation

## 2021-01-27 NOTE — Patient Instructions (Signed)
It was a pleasure meeting you today.  Please call and let me know or send a MyChart message when you are ready to make a decision about surgery or not.  As we talked about today, we could move forward with scheduling surgery to remove your tubes and ovaries.  If you felt strongly about a hysterectomy at the same time, we can discuss this as a possibility.

## 2021-01-28 ENCOUNTER — Other Ambulatory Visit: Payer: Self-pay | Admitting: Hematology

## 2021-01-28 ENCOUNTER — Encounter: Payer: Self-pay | Admitting: Hematology

## 2021-01-28 DIAGNOSIS — Z170 Estrogen receptor positive status [ER+]: Secondary | ICD-10-CM

## 2021-01-28 DIAGNOSIS — C50212 Malignant neoplasm of upper-inner quadrant of left female breast: Secondary | ICD-10-CM

## 2021-01-31 ENCOUNTER — Telehealth: Payer: Self-pay

## 2021-01-31 NOTE — Telephone Encounter (Signed)
LVM regarding pt's call into Triage GYN RN Charlyne Petrin) regarding Covid exposure on Thursday.  Pt stated she was negative when tested on Sunday and currently does not have S/S of Covid.  Pt wants to know should she continue taking her oral chemotherapy.  Instructed pt to continue to take her oral chemotherapy until she starts showing s/s of Covid at which time to contact Dr. Ernestina Penna office for further evaluation.

## 2021-02-02 ENCOUNTER — Other Ambulatory Visit: Payer: Self-pay | Admitting: *Deleted

## 2021-02-02 ENCOUNTER — Encounter: Payer: Self-pay | Admitting: *Deleted

## 2021-02-02 DIAGNOSIS — S21109A Unspecified open wound of unspecified front wall of thorax without penetration into thoracic cavity, initial encounter: Secondary | ICD-10-CM

## 2021-02-09 ENCOUNTER — Ambulatory Visit (HOSPITAL_COMMUNITY)
Admission: RE | Admit: 2021-02-09 | Discharge: 2021-02-09 | Disposition: A | Discharge status: Home / Self Care | Payer: BC Managed Care – PPO | Source: Ambulatory Visit | Attending: Vascular Surgery | Admitting: Vascular Surgery

## 2021-02-09 ENCOUNTER — Ambulatory Visit (INDEPENDENT_AMBULATORY_CARE_PROVIDER_SITE_OTHER): Payer: BC Managed Care – PPO | Admitting: Vascular Surgery

## 2021-02-09 ENCOUNTER — Other Ambulatory Visit: Payer: Self-pay

## 2021-02-09 ENCOUNTER — Encounter: Payer: Self-pay | Admitting: Vascular Surgery

## 2021-02-09 VITALS — BP 121/76 | HR 58 | Temp 98.0°F | Resp 20 | Ht 63.0 in | Wt 102.0 lb

## 2021-02-09 DIAGNOSIS — I6529 Occlusion and stenosis of unspecified carotid artery: Secondary | ICD-10-CM | POA: Insufficient documentation

## 2021-02-09 DIAGNOSIS — I6522 Occlusion and stenosis of left carotid artery: Secondary | ICD-10-CM | POA: Diagnosis not present

## 2021-02-09 NOTE — Progress Notes (Signed)
ASSESSMENT & PLAN   ASYMPTOMATIC 40 TO 59% LEFT CAROTID STENOSIS: This patient has an asymptomatic 40 to 59% left carotid stenosis.  I have explained that we would not consider left carotid endarterectomy unless the stenosis progressed to greater than 80% or she develop new left hemispheric symptoms.  I have ordered a follow-up carotid duplex scan in 1 year and I will see her at that time.  She is on aspirin and is on a statin.  We have discussed the importance of exercise and nutrition.  We have also reviewed the potential symptoms of cerebrovascular disease and she knows to call if she develops any new symptoms.  REASON FOR CONSULT:    Bilateral carotid bruits.  The consult is requested by Dr. Georgiann Mccoy.  HPI:   Sally Smith is a 60 y.o. female who was referred with bilateral carotid bruits.  I have reviewed the records from the referring office.  The patient was noted to have bilateral carotid bruits at the time of her last exam.  The patient also has a history of breast cancer, diverticulosis of the colon, osteopenia, mild recurrent major depression.  On my history, the patient is left-handed.  She denies any history of stroke, TIAs, expressive or receptive aphasia, or amaurosis fugax.  She has had no problems with dizziness.  She does have a wound on her chest and is scheduled to have some ribs resected with a latissimus flap.  This is in mid-to-late January.  She is on aspirin and is on a statin.  Past Medical History:  Diagnosis Date   Allergy    seasonal allergies   Anxiety    on meds   Breast cancer ([facility]) 07/03/2016   Cancer of left breast ([facility])    [facility] genetic testing negative on 05/31/16   Complication of anesthesia    Vagals easily to pain   Diverticulitis    Genetic testing 06/18/2016   Ms. Nantz underwent genetic counseling and testing for hereditary cancer syndromes on 05/31/2016. Her results were negative for mutations in all 46 genes analyzed by  [facility]'s 46-gene Common Hereditary Cancers Panel. Genes analyzed include: APC, ATM, AXIN2, BARD1, BMPR1A, BRCA1, BRCA2, BRIP1, CDH1, CDKN2A, CHEK2, CTNNA1, DICER1, EPCAM, GREM1, HOXB13, KIT, MEN1, MLH1, MSH2, MSH3, MSH6, MUTYH, NBN,   Glaucoma    on meds   Hyperlipidemia    on meds   Personal history of radiation therapy 2018   Left Breast Cancer   Skin cancer    left breast     Family History  Problem Relation Age of Onset   Breast cancer Mother 54       Contralateral breast cancer a few years later that metastasized.   Colon cancer Neg Hx    Ovarian cancer Neg Hx    Endometrial cancer Neg Hx    Pancreatic cancer Neg Hx    Prostate cancer Neg Hx     SOCIAL HISTORY: Social History   Tobacco Use   Smoking status: Never   Smokeless tobacco: Never  Substance Use Topics   Alcohol use: Yes    Comment: rare    Allergies  Allergen Reactions   Bee Venom Anaphylaxis   Elemental Sulfur Hives    itching   Erythromycin Base    Latex Itching    welps   Sulfa Antibiotics Hives and Swelling   Wound Dressing Adhesive    Contrast Media [Iodinated Diagnostic Agents] Rash    Current Outpatient Medications  Medication Sig Dispense Refill  acetaminophen (TYLENOL) 500 MG tablet Take 1,000 mg by mouth every 6 (six) hours as needed for mild pain, moderate pain, fever or headache.     ALPRAZolam (XANAX) 0.25 MG tablet Take 0.25 mg by mouth at bedtime as needed for anxiety.     aspirin EC 81 MG tablet Take 81 mg by mouth daily. Swallow whole.     atorvastatin (LIPITOR) 20 MG tablet Take 20 mg by mouth daily.     buPROPion (WELLBUTRIN XL) 150 MG 24 hr tablet TAKE 1 TABLET BY MOUTH EVERY DAY. 30 tablet 0   Calcium Carb-Cholecalciferol (CALCIUM-VITAMIN D) 500-200 MG-UNIT tablet Take 1 tablet by mouth daily.      cholecalciferol (VITAMIN D) 1000 units tablet Take 1,000 Units by mouth daily.     Docusate Sodium (STOOL SOFTENER LAXATIVE PO) Take 1 tablet by mouth daily at 6 (six) AM.      EPINEPHrine 0.3 mg/0.3 mL IJ SOAJ injection Inject into the muscle as needed.     hyaluronate sodium (RADIAPLEXRX) GEL APPLY TOPICALLY ONCE AS NEEDED FOR UP TO 1 DOSE 170 g 0   ibuprofen (ADVIL,MOTRIN) 200 MG tablet Take 400 mg by mouth every 6 (six) hours as needed for fever, headache, mild pain, moderate pain or cramping.     latanoprost (XALATAN) 0.005 % ophthalmic solution Place 1 drop into both eyes at bedtime.     letrozole (FEMARA) 2.5 MG tablet TAKE 1 TABLET BY MOUTH ONCE A DAY. 90 tablet 3   LORazepam (ATIVAN) 1 MG tablet Take 1 mg by mouth 3 (three) times daily.     ondansetron (ZOFRAN-ODT) 4 MG disintegrating tablet Take 1 tablet (4 mg total) by mouth every 8 (eight) hours as needed. for nausea 30 tablet 0   potassium chloride (KLOR-CON) 20 MEQ packet Take 20 mEq by mouth daily. 20 packet 0   PROCTO-MED HC 2.5 % rectal cream Apply topically daily as needed.     Saccharomyces boulardii (DIGESTIVE PROBIOTIC PO) Take 1 capsule by mouth daily at 6 (six) AM.     traMADol (ULTRAM) 50 MG tablet Take 1 tablet (50 mg total) by mouth every 12 (twelve) hours as needed. 20 tablet 1   zoledronic acid (RECLAST) 5 MG/100ML SOLN injection Inject 5 mg into the vein. EVERY 6MOS (due in May/Nov)     zolpidem (AMBIEN) 10 MG tablet Take 5 mg by mouth at bedtime as needed for sleep.     No current facility-administered medications for this visit.    REVIEW OF SYSTEMS:  $RemoveB'[X]'QzylUgzj$  denotes positive finding, $RemoveBeforeDEI'[ ]'oFvaVJvXznpdWIeU$  denotes negative finding Cardiac  Comments:  Chest pain or chest pressure:    Shortness of breath upon exertion:    Short of breath when lying flat:    Irregular heart rhythm:        Vascular    Pain in calf, thigh, or hip brought on by ambulation:    Pain in feet at night that wakes you up from your sleep:     Blood clot in your veins:    Leg swelling:         Pulmonary    Oxygen at home:    Productive cough:     Wheezing:         Neurologic    Sudden weakness in arms or legs:     Sudden  numbness in arms or legs:     Sudden onset of difficulty speaking or slurred speech:    Temporary loss of vision in one eye:  Problems with dizziness:         Gastrointestinal    Blood in stool:     Vomited blood:         Genitourinary    Burning when urinating:     Blood in urine:        Psychiatric    Major depression:         Hematologic    Bleeding problems:    Problems with blood clotting too easily:        Skin    Rashes or ulcers:        Constitutional    Fever or chills:    -  PHYSICAL EXAM:   Vitals:   02/09/21 1423  BP: 121/76  Pulse: (!) 58  Resp: 20  Temp: 98 F (36.7 C)  SpO2: 98%  Weight: 102 lb (46.3 kg)  Height: $Remove'5\' 3"'qlsMSdA$  (1.6 m)   Body mass index is 18.07 kg/m.  GENERAL: The patient is a well-nourished female, in no acute distress. The vital signs are documented above. CARDIAC: There is a regular rate and rhythm.  VASCULAR: She has a right carotid bruit.  I did not detect a left carotid bruit. She has palpable femoral and pedal pulses. She has no significant lower extremity swelling. PULMONARY: There is good air exchange bilaterally without wheezing or rales. ABDOMEN: Soft and non-tender with normal pitched bowel sounds.  MUSCULOSKELETAL: There are no major deformities. NEUROLOGIC: No focal weakness or paresthesias are detected. SKIN: There are no ulcers or rashes noted. PSYCHIATRIC: The patient has a normal affect.  DATA:    CAROTID DUPLEX: I have independently interpreted her carotid duplex scan today.  On the right side there is a less than 39% stenosis.  The right vertebral artery is patent with antegrade flow.  On the left side there is a 40 to 59% stenosis.  The left vertebral artery is patent with antegrade flow.   Both subclavian arteries are patent with no areas of significant stenosis noted.   Deitra Mayo [facility]

## 2021-02-14 ENCOUNTER — Other Ambulatory Visit: Payer: Self-pay | Admitting: Hematology

## 2021-02-14 DIAGNOSIS — Z170 Estrogen receptor positive status [ER+]: Secondary | ICD-10-CM

## 2021-02-15 ENCOUNTER — Telehealth: Payer: Self-pay | Admitting: *Deleted

## 2021-02-15 NOTE — Telephone Encounter (Signed)
Sally Smith contacted the office with questions regarding pre admission testing. Patient is scheduled for latissmus muscle flap for chest wall reconstruction with Dr. Marla Roe and Dr. Kipp Brood 03/15/2021. Patient states she has received anti nausea mediations prior to anesthesia in the past that have worked. Advised patient to discuss this with pre admission testing during appt 1/23. Patient with concerns regarding pain management at discharge stating she has received tramadol in the past which has not helped her surgical pain. Advised patient to discuss this with MD's prior to discharge. Patient states the sore on her chest reopened on 1/25. Patient states this is not unusual for her. Advised if the area worsens to contact [name] for further wound care advice. Reassured patient regarding surgery. Reviewed [name] location with patient.  Encouraged patient to call back with any further questions she may have. Patient verbalized understanding.

## 2021-02-21 ENCOUNTER — Ambulatory Visit
Admission: RE | Admit: 2021-02-21 | Discharge: 2021-02-21 | Disposition: A | Discharge status: Home / Self Care | Payer: BC Managed Care – PPO | Source: Ambulatory Visit | Attending: Thoracic Surgery (Cardiothoracic Vascular Surgery) | Admitting: Thoracic Surgery (Cardiothoracic Vascular Surgery)

## 2021-02-21 ENCOUNTER — Other Ambulatory Visit: Payer: Self-pay

## 2021-02-21 DIAGNOSIS — S21109A Unspecified open wound of unspecified front wall of thorax without penetration into thoracic cavity, initial encounter: Secondary | ICD-10-CM

## 2021-02-22 ENCOUNTER — Telehealth: Payer: Self-pay | Admitting: Plastic Surgery

## 2021-02-22 NOTE — Telephone Encounter (Signed)
Patient would like to know if she should get the shingles shot before or wait until after surgery. She would also like to know if she will be put on an antibiotic before or after the surgery and if she should stop taking aspirin before bed with her cholesterol medicine.  She is now aware that she has a Pre-Op on the 10th, so she knows some of those questions may be answered then but she would just like to know if there is anything she needs to stop or start early.  Please follow up with patient.

## 2021-02-24 NOTE — Telephone Encounter (Signed)
Called Sally Smith and informed her of Matt's recommendations. Sally Smith conveyed understanding. Sally Smith reported her primary care physician, Dr. Jarome Lamas prescribed the aspirin. I adv that I would call his office Monday to confirm the process of surgical clearance for a blood thinner. His phone # is [phone].

## 2021-02-28 ENCOUNTER — Other Ambulatory Visit: Payer: Self-pay

## 2021-02-28 ENCOUNTER — Telehealth: Payer: Self-pay

## 2021-02-28 ENCOUNTER — Encounter: Payer: Self-pay | Admitting: Surgical

## 2021-02-28 ENCOUNTER — Ambulatory Visit (INDEPENDENT_AMBULATORY_CARE_PROVIDER_SITE_OTHER): Payer: BC Managed Care – PPO | Admitting: Surgical

## 2021-02-28 VITALS — BP 124/76 | HR 78 | Ht 63.0 in | Wt 102.0 lb

## 2021-02-28 DIAGNOSIS — S21109A Unspecified open wound of unspecified front wall of thorax without penetration into thoracic cavity, initial encounter: Secondary | ICD-10-CM

## 2021-02-28 MED ORDER — ONDANSETRON HCL 4 MG PO TABS: 4.0000 mg | ORAL_TABLET | Freq: Three times a day (TID) | ORAL | 0 refills | Status: DC | PRN

## 2021-02-28 MED ORDER — HYDROCODONE-ACETAMINOPHEN 5-325 MG PO TABS: 1.0000 | ORAL_TABLET | Freq: Four times a day (QID) | ORAL | 0 refills | Status: AC | PRN

## 2021-02-28 MED ORDER — CEPHALEXIN 500 MG PO CAPS: 500.0000 mg | ORAL_CAPSULE | Freq: Four times a day (QID) | ORAL | 0 refills | Status: AC

## 2021-02-28 NOTE — H&P (View-Only) (Signed)
Patient ID: Sally Smith, female    DOB: 03/04/1960, 61 y.o.   MRN: [id number]  Chief Complaint  Patient presents with   Pre-op Exam      ICD-10-CM   1. Open wound of chest wall, unspecified laterality, initial encounter  S21.109A        History of Present Illness: Sally Smith is a 61 y.o.  female  with a history of left breast cancer that was subsequently treated with partial mastectomy and radiation.  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  She presents for preoperative evaluation for upcoming procedure, left latissimus myocutaneous muscle flap for chest wall reconstruction, scheduled for 03/15/2021 with Dr. Marla Roe and Dr. Kipp Brood.  She reports a history of nausea after anesthesia, reports she had scopolamine patch in the past which was helpful. No history of DVT/PE.  No family history of DVT/PE.  No family or personal history of bleeding or clotting disorders.  No history of CVA/MI.   She is currently on aspirin 81 mg, this is prescribed by her PCP.  She has a history of carotid stenosis, recently seen by vascular surgery.  No current plans for any vascular interventions.  Summary of Previous Visit: Patient had breast cancer 4 years ago and was treated with partial mastectomy and postop radiation.  Since then she had breakdown of her skin.  She had excision in March due to concerns for cancer, it was negative.  Now the concern is she has a nonhealing wound.  PMH Significant for: Carotid stenosis, hyperlipidemia, anxiety, left breast cancer  Patient reports she has been feeling well lately.  She has not had any recent changes in her health.  She is anxious for surgery, otherwise she is doing well. She denies any cardiac or pulmonary history.  Patient had CT scan on 02/21/2021 which showed stable postsurgical changes in the left breast and left axilla.  Past Medical History: Allergies: Allergies  Allergen Reactions   Bee Venom  Anaphylaxis   Elemental Sulfur Hives    itching   Erythromycin Base    Latex Itching    welps   Sulfa Antibiotics Hives and Swelling   Wound Dressing Adhesive    Contrast Media [Iodinated Contrast Media] Rash    Current Medications:  Current Outpatient Medications:    acetaminophen (TYLENOL) 500 MG tablet, Take 1,000 mg by mouth every 6 (six) hours as needed for mild pain, moderate pain, fever or headache., Disp: , Rfl:    ALPRAZolam (XANAX) 0.25 MG tablet, Take 0.25 mg by mouth at bedtime as needed for anxiety., Disp: , Rfl:    aspirin EC 81 MG tablet, Take 81 mg by mouth daily. Swallow whole., Disp: , Rfl:    atorvastatin (LIPITOR) 20 MG tablet, Take 20 mg by mouth daily., Disp: , Rfl:    buPROPion (WELLBUTRIN XL) 150 MG 24 hr tablet, TAKE 1 TABLET BY MOUTH EVERY DAY., Disp: 30 tablet, Rfl: 0   Calcium Carb-Cholecalciferol (CALCIUM-VITAMIN D) 500-200 MG-UNIT tablet, Take 1 tablet by mouth daily. , Disp: , Rfl:    cephALEXin (KEFLEX) 500 MG capsule, Take 1 capsule (500 mg total) by mouth 4 (four) times daily for 3 days., Disp: 12 capsule, Rfl: 0   cholecalciferol (VITAMIN D) 1000 units tablet, Take 1,000 Units by mouth daily., Disp: , Rfl:    Docusate Sodium (STOOL SOFTENER LAXATIVE PO), Take 1 tablet by mouth daily at 6 (six) AM., Disp: , Rfl:    EPINEPHrine 0.3 mg/0.3  mL IJ SOAJ injection, Inject into the muscle as needed., Disp: , Rfl:    hyaluronate sodium (RADIAPLEXRX) GEL, APPLY TOPICALLY ONCE AS NEEDED FOR UP TO 1 DOSE, Disp: 170 g, Rfl: 0   HYDROcodone-acetaminophen (NORCO) 5-325 MG tablet, Take 1 tablet by mouth every 6 (six) hours as needed for up to 5 days for severe pain., Disp: 20 tablet, Rfl: 0   ibuprofen (ADVIL,MOTRIN) 200 MG tablet, Take 400 mg by mouth every 6 (six) hours as needed for fever, headache, mild pain, moderate pain or cramping., Disp: , Rfl:    latanoprost (XALATAN) 0.005 % ophthalmic solution, Place 1 drop into both eyes at bedtime., Disp: , Rfl:     letrozole (FEMARA) 2.5 MG tablet, TAKE 1 TABLET BY MOUTH ONCE A DAY., Disp: 90 tablet, Rfl: 3   LORazepam (ATIVAN) 1 MG tablet, Take 1 mg by mouth 3 (three) times daily., Disp: , Rfl:    ondansetron (ZOFRAN) 4 MG tablet, Take 1 tablet (4 mg total) by mouth every 8 (eight) hours as needed for nausea or vomiting., Disp: 20 tablet, Rfl: 0   ondansetron (ZOFRAN-ODT) 4 MG disintegrating tablet, DISSOLVE 1 TABLET IN MOUTH EVERY 8 HOURS AS NEEDED FOR NAUSEA, Disp: 30 tablet, Rfl: 0   potassium chloride (KLOR-CON) 20 MEQ packet, Take 20 mEq by mouth daily., Disp: 20 packet, Rfl: 0   PROCTO-MED HC 2.5 % rectal cream, Apply topically daily as needed., Disp: , Rfl:    Saccharomyces boulardii (DIGESTIVE PROBIOTIC PO), Take 1 capsule by mouth daily at 6 (six) AM., Disp: , Rfl:    traMADol (ULTRAM) 50 MG tablet, Take 1 tablet (50 mg total) by mouth every 12 (twelve) hours as needed., Disp: 20 tablet, Rfl: 1   zoledronic acid (RECLAST) 5 MG/100ML SOLN injection, Inject 5 mg into the vein. EVERY 6MOS (due in May/Nov), Disp: , Rfl:    zolpidem (AMBIEN) 10 MG tablet, Take 5 mg by mouth at bedtime as needed for sleep., Disp: , Rfl:   Past Medical Problems: Past Medical History:  Diagnosis Date   Allergy    seasonal allergies   Anxiety    on meds   Breast cancer ([place]) 07/03/2016   Cancer of left breast ([place])    [employer] genetic testing negative on 3/33/54   Complication of anesthesia    Vagals easily to pain   Diverticulitis    Genetic testing 06/18/2016   Ms. Freimark underwent genetic counseling and testing for hereditary cancer syndromes on 05/31/2016. Her results were negative for mutations in all 46 genes analyzed by [facility]'s 46-gene Common Hereditary Cancers Panel. Genes analyzed include: APC, ATM, AXIN2, BARD1, BMPR1A, BRCA1, BRCA2, BRIP1, CDH1, CDKN2A, CHEK2, CTNNA1, DICER1, EPCAM, GREM1, HOXB13, KIT, MEN1, MLH1, MSH2, MSH3, MSH6, MUTYH, NBN,   Glaucoma    on meds   Hyperlipidemia    on meds    Personal history of radiation therapy 2018   Left Breast Cancer   Skin cancer    left breast     Past Surgical History: Past Surgical History:  Procedure Laterality Date   BREAST EXCISIONAL BIOPSY Left 04/2020   BREAST LUMPECTOMY Left 07/03/2016   BREAST LUMPECTOMY WITH RADIOACTIVE SEED AND SENTINEL LYMPH NODE BIOPSY Left 07/04/2016   Procedure: LEFT BREAST LUMPECTOMY WITH RADIOACTIVE SEED X 2 AND SENTINEL LYMPH NODE BIOPSY;  Surgeon: Stark Klein, MD;  Location: [facility];  Service: General;  Laterality: Left;   EYE SURGERY Bilateral 11/25/2020   cataract surgery   SKIN BIOPSY Left 05/17/2020  Procedure: INCISIONAL BIOPSY LEFT  CHEST WALL MASS;  Surgeon: Stark Klein, MD;  Location: [facility];  Service: General;  Laterality: Left;   surgery on right leg     TONSILLECTOMY     WISDOM TOOTH EXTRACTION  1985    Social History: Social History   Socioeconomic History   Marital status: Married    Spouse name: Not on file   Number of children: Not on file   Years of education: Not on file   Highest education level: Not on file  Occupational History   Not on file  Tobacco Use   Smoking status: Never   Smokeless tobacco: Never  Vaping Use   Vaping Use: Never used  Substance and Sexual Activity   Alcohol use: Yes    Comment: rare   Drug use: No    Comment: uses CBD oil from [facility]   Sexual activity: Yes    Birth control/protection: Surgical    Comment: husband had vasectomy  Other Topics Concern   Not on file  Social History Narrative   Not on file   Social Determinants of Health   Financial Resource Strain: Not on file  Food Insecurity: Not on file  Transportation Needs: Not on file  Physical Activity: Not on file  Stress: Not on file  Social Connections: Not on file  Intimate Partner Violence: Not on file    Family History: Family History  Problem Relation Age of Onset   Breast cancer Mother 42       Contralateral  breast cancer a few years later that metastasized.   Colon cancer Neg Hx    Ovarian cancer Neg Hx    Endometrial cancer Neg Hx    Pancreatic cancer Neg Hx    Prostate cancer Neg Hx     Review of Systems: Review of Systems  Constitutional: Negative.   Respiratory: Negative.    Cardiovascular: Negative.   Gastrointestinal: Negative.   Neurological: Negative.    Physical Exam: Vital Signs BP 124/76 (BP Location: Left Arm, Patient Position: Sitting, Cuff Size: Small)    Pulse 78    Ht $R'5\' 3"'MT$  (1.6 m)    Wt 102 lb (46.3 kg)    LMP 11/29/2006 (Exact Date)    SpO2 100%    BMI 18.07 kg/m   Physical Exam  Constitutional:      General: Not in acute distress.    Appearance: Normal appearance. Not ill-appearing.  HENT:     Head: Normocephalic and atraumatic.  Eyes:     Pupils: Pupils are equal, round Neck:     Musculoskeletal: Normal range of motion.  Cardiovascular:     Rate and Rhythm: Normal rate    Pulses: Normal pulses.  Pulmonary:     Effort: Pulmonary effort is normal. No respiratory distress.  Abdominal:     General: Abdomen is flat. There is no distension.  Musculoskeletal: Normal range of motion.  Skin:    General: Skin is warm and dry.     Findings: No erythema or rash.  Neurological:     General: No focal deficit present.     Mental Status: Alert and oriented to person, place, and time. Mental status is at baseline.     Motor: No weakness.  Psychiatric:        Mood and Affect: Mood normal.        Behavior: Behavior normal.    Assessment/Plan: The patient is scheduled for left latissimus myocutaneous muscle flap for chest  wall reconstruction, with Dr. Marla Roe and Dr. Kipp Brood.  Risks, benefits, and alternatives of procedure discussed, questions answered and consent obtained.    Smoking Status: Non-smoker; Counseling Given?  N/A Last Mammogram: 09/28/2020; Results: No mammographic evidence of malignancy  Caprini Score: 7; Risk Factors include: Age, history of  breast cancer, on letrozole, and length of planned surgery. Recommendation for mechanical prophylaxis. Encourage early ambulation.  May require postop Lovenox while hospitalized.  Pictures obtained: @consult   Post-op Rx sent to pharmacy: Norco, Zofran, Keflex  Patient does currently take tramadol for "ghost" pain related to her left breast cancer/partial mastectomy.  She reports that she only occasionally takes this.  Patient was provided with the General Surgical Risk consent document and Pain Medication Agreement prior to their appointment.  They had adequate time to read through the risk consent documents and Pain Medication Agreement. We also discussed them in person together during this preop appointment. All of their questions were answered to their satisfaction.  Recommended calling if they have any further questions.  Risk consent form and Pain Medication Agreement to be scanned into patient's chart.  We discussed the risks specific to left latissimus myocutaneous muscle flap for chest wall reconstruction.  All the patient's questions and her husband's questions were answered to their content.  We will send clearance the patient's PCP Dr. Harmon Pier to request hold of aspirin prior to surgery. We will also send clearance the patient's oncology to request hold of letrozole prior to surgery.  Electronically signed by: Carola Rhine Zacary Bauer, PA-C 02/28/2021 3:50 PM

## 2021-02-28 NOTE — Telephone Encounter (Signed)
Faxed surgical/medical clearance to Dr. Burr Medico when to stop Letrozole, Dr. Harmon Pier -[facility]

## 2021-02-28 NOTE — Progress Notes (Signed)
Patient ID: Sally Smith, female    DOB: 17-Feb-1961, 61 y.o.   MRN: [id number]  Chief Complaint  Patient presents with   Pre-op Exam      ICD-10-CM   1. Open wound of chest wall, unspecified laterality, initial encounter  S21.109A        History of Present Illness: Sally Smith is a 61 y.o.  female  with a history of left breast cancer that was subsequently treated with partial mastectomy and radiation.  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  She presents for preoperative evaluation for upcoming procedure, left latissimus myocutaneous muscle flap for chest wall reconstruction, scheduled for 03/15/2021 with Dr. Marla Roe and Dr. Kipp Brood.  She reports a history of nausea after anesthesia, reports she had scopolamine patch in the past which was helpful. No history of DVT/PE.  No family history of DVT/PE.  No family or personal history of bleeding or clotting disorders.  No history of CVA/MI.   She is currently on aspirin 81 mg, this is prescribed by her PCP.  She has a history of carotid stenosis, recently seen by vascular surgery.  No current plans for any vascular interventions.  Summary of Previous Visit: Patient had breast cancer 4 years ago and was treated with partial mastectomy and postop radiation.  Since then she had breakdown of her skin.  She had excision in March due to concerns for cancer, it was negative.  Now the concern is she has a nonhealing wound.  PMH Significant for: Carotid stenosis, hyperlipidemia, anxiety, left breast cancer  Patient reports she has been feeling well lately.  She has not had any recent changes in her health.  She is anxious for surgery, otherwise she is doing well. She denies any cardiac or pulmonary history.  Patient had CT scan on 02/21/2021 which showed stable postsurgical changes in the left breast and left axilla.  Past Medical History: Allergies: Allergies  Allergen Reactions   Bee Venom  Anaphylaxis   Elemental Sulfur Hives    itching   Erythromycin Base    Latex Itching    welps   Sulfa Antibiotics Hives and Swelling   Wound Dressing Adhesive    Contrast Media [Iodinated Contrast Media] Rash    Current Medications:  Current Outpatient Medications:    acetaminophen (TYLENOL) 500 MG tablet, Take 1,000 mg by mouth every 6 (six) hours as needed for mild pain, moderate pain, fever or headache., Disp: , Rfl:    ALPRAZolam (XANAX) 0.25 MG tablet, Take 0.25 mg by mouth at bedtime as needed for anxiety., Disp: , Rfl:    aspirin EC 81 MG tablet, Take 81 mg by mouth daily. Swallow whole., Disp: , Rfl:    atorvastatin (LIPITOR) 20 MG tablet, Take 20 mg by mouth daily., Disp: , Rfl:    buPROPion (WELLBUTRIN XL) 150 MG 24 hr tablet, TAKE 1 TABLET BY MOUTH EVERY DAY., Disp: 30 tablet, Rfl: 0   Calcium Carb-Cholecalciferol (CALCIUM-VITAMIN D) 500-200 MG-UNIT tablet, Take 1 tablet by mouth daily. , Disp: , Rfl:    cephALEXin (KEFLEX) 500 MG capsule, Take 1 capsule (500 mg total) by mouth 4 (four) times daily for 3 days., Disp: 12 capsule, Rfl: 0   cholecalciferol (VITAMIN D) 1000 units tablet, Take 1,000 Units by mouth daily., Disp: , Rfl:    Docusate Sodium (STOOL SOFTENER LAXATIVE PO), Take 1 tablet by mouth daily at 6 (six) AM., Disp: , Rfl:    EPINEPHrine 0.3 mg/0.3  mL IJ SOAJ injection, Inject into the muscle as needed., Disp: , Rfl:    hyaluronate sodium (RADIAPLEXRX) GEL, APPLY TOPICALLY ONCE AS NEEDED FOR UP TO 1 DOSE, Disp: 170 g, Rfl: 0   HYDROcodone-acetaminophen (NORCO) 5-325 MG tablet, Take 1 tablet by mouth every 6 (six) hours as needed for up to 5 days for severe pain., Disp: 20 tablet, Rfl: 0   ibuprofen (ADVIL,MOTRIN) 200 MG tablet, Take 400 mg by mouth every 6 (six) hours as needed for fever, headache, mild pain, moderate pain or cramping., Disp: , Rfl:    latanoprost (XALATAN) 0.005 % ophthalmic solution, Place 1 drop into both eyes at bedtime., Disp: , Rfl:     letrozole (FEMARA) 2.5 MG tablet, TAKE 1 TABLET BY MOUTH ONCE A DAY., Disp: 90 tablet, Rfl: 3   LORazepam (ATIVAN) 1 MG tablet, Take 1 mg by mouth 3 (three) times daily., Disp: , Rfl:    ondansetron (ZOFRAN) 4 MG tablet, Take 1 tablet (4 mg total) by mouth every 8 (eight) hours as needed for nausea or vomiting., Disp: 20 tablet, Rfl: 0   ondansetron (ZOFRAN-ODT) 4 MG disintegrating tablet, DISSOLVE 1 TABLET IN MOUTH EVERY 8 HOURS AS NEEDED FOR NAUSEA, Disp: 30 tablet, Rfl: 0   potassium chloride (KLOR-CON) 20 MEQ packet, Take 20 mEq by mouth daily., Disp: 20 packet, Rfl: 0   PROCTO-MED HC 2.5 % rectal cream, Apply topically daily as needed., Disp: , Rfl:    Saccharomyces boulardii (DIGESTIVE PROBIOTIC PO), Take 1 capsule by mouth daily at 6 (six) AM., Disp: , Rfl:    traMADol (ULTRAM) 50 MG tablet, Take 1 tablet (50 mg total) by mouth every 12 (twelve) hours as needed., Disp: 20 tablet, Rfl: 1   zoledronic acid (RECLAST) 5 MG/100ML SOLN injection, Inject 5 mg into the vein. EVERY 6MOS (due in May/Nov), Disp: , Rfl:    zolpidem (AMBIEN) 10 MG tablet, Take 5 mg by mouth at bedtime as needed for sleep., Disp: , Rfl:   Past Medical Problems: Past Medical History:  Diagnosis Date   Allergy    seasonal allergies   Anxiety    on meds   Breast cancer ([place]) 07/03/2016   Cancer of left breast ([place])    [employer] genetic testing negative on 08/13/61   Complication of anesthesia    Vagals easily to pain   Diverticulitis    Genetic testing 06/18/2016   Ms. Heuberger underwent genetic counseling and testing for hereditary cancer syndromes on 05/31/2016. Her results were negative for mutations in all 46 genes analyzed by [facility]'s 46-gene Common Hereditary Cancers Panel. Genes analyzed include: APC, ATM, AXIN2, BARD1, BMPR1A, BRCA1, BRCA2, BRIP1, CDH1, CDKN2A, CHEK2, CTNNA1, DICER1, EPCAM, GREM1, HOXB13, KIT, MEN1, MLH1, MSH2, MSH3, MSH6, MUTYH, NBN,   Glaucoma    on meds   Hyperlipidemia    on meds    Personal history of radiation therapy 2018   Left Breast Cancer   Skin cancer    left breast     Past Surgical History: Past Surgical History:  Procedure Laterality Date   BREAST EXCISIONAL BIOPSY Left 04/2020   BREAST LUMPECTOMY Left 07/03/2016   BREAST LUMPECTOMY WITH RADIOACTIVE SEED AND SENTINEL LYMPH NODE BIOPSY Left 07/04/2016   Procedure: LEFT BREAST LUMPECTOMY WITH RADIOACTIVE SEED X 2 AND SENTINEL LYMPH NODE BIOPSY;  Surgeon: Stark Klein, MD;  Location: [facility];  Service: General;  Laterality: Left;   EYE SURGERY Bilateral 11/25/2020   cataract surgery   SKIN BIOPSY Left 05/17/2020  Procedure: INCISIONAL BIOPSY LEFT  CHEST WALL MASS;  Surgeon: Stark Klein, MD;  Location: [facility];  Service: General;  Laterality: Left;   surgery on right leg     TONSILLECTOMY     WISDOM TOOTH EXTRACTION  1985    Social History: Social History   Socioeconomic History   Marital status: Married    Spouse name: Not on file   Number of children: Not on file   Years of education: Not on file   Highest education level: Not on file  Occupational History   Not on file  Tobacco Use   Smoking status: Never   Smokeless tobacco: Never  Vaping Use   Vaping Use: Never used  Substance and Sexual Activity   Alcohol use: Yes    Comment: rare   Drug use: No    Comment: uses CBD oil from [facility]   Sexual activity: Yes    Birth control/protection: Surgical    Comment: husband had vasectomy  Other Topics Concern   Not on file  Social History Narrative   Not on file   Social Determinants of Health   Financial Resource Strain: Not on file  Food Insecurity: Not on file  Transportation Needs: Not on file  Physical Activity: Not on file  Stress: Not on file  Social Connections: Not on file  Intimate Partner Violence: Not on file    Family History: Family History  Problem Relation Age of Onset   Breast cancer Mother 5       Contralateral  breast cancer a few years later that metastasized.   Colon cancer Neg Hx    Ovarian cancer Neg Hx    Endometrial cancer Neg Hx    Pancreatic cancer Neg Hx    Prostate cancer Neg Hx     Review of Systems: Review of Systems  Constitutional: Negative.   Respiratory: Negative.    Cardiovascular: Negative.   Gastrointestinal: Negative.   Neurological: Negative.    Physical Exam: Vital Signs BP 124/76 (BP Location: Left Arm, Patient Position: Sitting, Cuff Size: Small)    Pulse 78    Ht $R'5\' 3"'Mg$  (1.6 m)    Wt 102 lb (46.3 kg)    LMP 11/29/2006 (Exact Date)    SpO2 100%    BMI 18.07 kg/m   Physical Exam  Constitutional:      General: Not in acute distress.    Appearance: Normal appearance. Not ill-appearing.  HENT:     Head: Normocephalic and atraumatic.  Eyes:     Pupils: Pupils are equal, round Neck:     Musculoskeletal: Normal range of motion.  Cardiovascular:     Rate and Rhythm: Normal rate    Pulses: Normal pulses.  Pulmonary:     Effort: Pulmonary effort is normal. No respiratory distress.  Abdominal:     General: Abdomen is flat. There is no distension.  Musculoskeletal: Normal range of motion.  Skin:    General: Skin is warm and dry.     Findings: No erythema or rash.  Neurological:     General: No focal deficit present.     Mental Status: Alert and oriented to person, place, and time. Mental status is at baseline.     Motor: No weakness.  Psychiatric:        Mood and Affect: Mood normal.        Behavior: Behavior normal.    Assessment/Plan: The patient is scheduled for left latissimus myocutaneous muscle flap for chest  wall reconstruction, with Dr. Marla Roe and Dr. Kipp Brood.  Risks, benefits, and alternatives of procedure discussed, questions answered and consent obtained.    Smoking Status: Non-smoker; Counseling Given?  N/A Last Mammogram: 09/28/2020; Results: No mammographic evidence of malignancy  Caprini Score: 7; Risk Factors include: Age, history of  breast cancer, on letrozole, and length of planned surgery. Recommendation for mechanical prophylaxis. Encourage early ambulation.  May require postop Lovenox while hospitalized.  Pictures obtained: @consult   Post-op Rx sent to pharmacy: Norco, Zofran, Keflex  Patient does currently take tramadol for "ghost" pain related to her left breast cancer/partial mastectomy.  She reports that she only occasionally takes this.  Patient was provided with the General Surgical Risk consent document and Pain Medication Agreement prior to their appointment.  They had adequate time to read through the risk consent documents and Pain Medication Agreement. We also discussed them in person together during this preop appointment. All of their questions were answered to their satisfaction.  Recommended calling if they have any further questions.  Risk consent form and Pain Medication Agreement to be scanned into patient's chart.  We discussed the risks specific to left latissimus myocutaneous muscle flap for chest wall reconstruction.  All the patient's questions and her husband's questions were answered to their content.  We will send clearance the patient's PCP Dr. Harmon Pier to request hold of aspirin prior to surgery. We will also send clearance the patient's oncology to request hold of letrozole prior to surgery.  Electronically signed by: Carola Rhine Larin Weissberg, PA-C 02/28/2021 3:50 PM

## 2021-03-01 ENCOUNTER — Telehealth: Payer: Self-pay

## 2021-03-01 NOTE — Telephone Encounter (Signed)
Patient would like to know if she is able to have her shingles shot before her surgery. Please call patient with answer.

## 2021-03-03 ENCOUNTER — Telehealth: Payer: Self-pay | Admitting: Plastic Surgery

## 2021-03-03 NOTE — Telephone Encounter (Signed)
Left voicemail detailing below information to patient.

## 2021-03-03 NOTE — Telephone Encounter (Signed)
Pt states she has an appointment with [facility] on Tuesday.   She asked about Keflex. I stated per pre-op note it is for after surgery. She is going to wait on the Shingles shot.

## 2021-03-03 NOTE — Telephone Encounter (Addendum)
Patient is looking for more information about Sally Smith and her prescription. She is just wanting to make sure she has everything before surgery.  Please follow up with patient. Patient has a few questions she would like to clarify about medications before surgery as well.   Please let front desk staff know if additional scheduling is needed.

## 2021-03-07 ENCOUNTER — Telehealth: Payer: Self-pay

## 2021-03-07 NOTE — Telephone Encounter (Signed)
Patient called with concerns about the results of her recent CT scan.  She said it indicated that she has new nodules on her right lung and a cyst on her liver since her last CT scan in March, 2022.  Patient would like to make sure this doesn't mean that there will be additional risk with her surgery which she is scheduled to have with [place] next week.  Please call.

## 2021-03-08 ENCOUNTER — Encounter: Payer: Self-pay | Admitting: Surgical

## 2021-03-08 NOTE — Progress Notes (Signed)
Surgical Clearance has been received from Dr. Burr Medico for patient's upcoming surgery with Dr. Marla Roe .  Do not hold Letrozole per Dr. Burr Medico.

## 2021-03-10 NOTE — Pre-Procedure Instructions (Signed)
Surgical Instructions    Your procedure is scheduled on Wednesday, January 25th.  Report to [facility] Main Entrance "A" at 5:30 A.M., then check in with the Admitting office.  Call this number if you have problems the morning of surgery:  [phone]   If you have any questions prior to your surgery date call [phone]: Open Monday-Friday 8am-4pm    Remember:  Do not eat or drink after midnight the night before your surgery   Take these medicines the morning of surgery with A SIP OF WATER  atorvastatin (LIPITOR)  buPROPion (WELLBUTRIN XL)  loratadine (CLARITIN)  timolol (TIMOPTIC)  As needed: acetaminophen (TYLENOL)  LORazepam (ATIVAN) ondansetron (ZOFRAN) traMADol (ULTRAM)   Follow your surgeon's instructions on when to stop Aspirin.  If no instructions were given by your surgeon then you will need to call the office to get those instructions.    As of today, STOP taking any Aleve, Naproxen, Ibuprofen, Motrin, Advil, Goody's, BC's, all herbal medications, fish oil, and all vitamins.                     Do NOT Smoke (Tobacco/Vaping) for 24 hours prior to your procedure.  If you use a CPAP at night, you may bring your mask/headgear for your overnight stay.   Contacts, glasses, piercing's, hearing aid's, dentures or partials may not be worn into surgery, please bring cases for these belongings.    For patients admitted to the hospital, discharge time will be determined by your treatment team.   Patients discharged the day of surgery will not be allowed to drive home, and someone needs to stay with them for 24 hours.  NO VISITORS WILL BE ALLOWED IN PRE-OP WHERE PATIENTS ARE PREPPED FOR SURGERY.  ONLY 1 SUPPORT PERSON MAY BE PRESENT IN THE WAITING ROOM WHILE YOU ARE IN SURGERY.  IF YOU ARE TO BE ADMITTED, ONCE YOU ARE IN YOUR ROOM YOU WILL BE ALLOWED TWO (2) VISITORS. (1) VISITOR MAY STAY OVERNIGHT BUT MUST ARRIVE TO THE ROOM BY 8pm.  Minor children may have two parents  present. Special consideration for safety and communication needs will be reviewed on a case by case basis.   Special instructions:   [place]- Preparing For Surgery  Before surgery, you can play an important role. Because skin is not sterile, your skin needs to be as free of germs as possible. You can reduce the number of germs on your skin by washing with CHG (chlorahexidine gluconate) Soap before surgery.  CHG is an antiseptic cleaner which kills germs and bonds with the skin to continue killing germs even after washing.    Oral Hygiene is also important to reduce your risk of infection.  Remember - BRUSH YOUR TEETH THE MORNING OF SURGERY WITH YOUR REGULAR TOOTHPASTE  Please do not use if you have an allergy to CHG or antibacterial soaps. If your skin becomes reddened/irritated stop using the CHG.  Do not shave (including legs and underarms) for at least 48 hours prior to first CHG shower. It is OK to shave your face.  Please follow these instructions carefully.   Shower the NIGHT BEFORE SURGERY and the MORNING OF SURGERY  If you chose to wash your hair, wash your hair first as usual with your normal shampoo.  After you shampoo, rinse your hair and body thoroughly to remove the shampoo.  Use CHG Soap as you would any other liquid soap. You can apply CHG directly to the skin and wash  gently with a scrungie or a clean washcloth.   Apply the CHG Soap to your body ONLY FROM THE NECK DOWN.  Do not use on open wounds or open sores. Avoid contact with your eyes, ears, mouth and genitals (private parts). Wash Face and genitals (private parts)  with your normal soap.   Wash thoroughly, paying special attention to the area where your surgery will be performed.  Thoroughly rinse your body with warm water from the neck down.  DO NOT shower/wash with your normal soap after using and rinsing off the CHG Soap.  Pat yourself dry with a CLEAN TOWEL.  Wear CLEAN PAJAMAS to bed the night before  surgery  Place CLEAN SHEETS on your bed the night before your surgery  DO NOT SLEEP WITH PETS.   Day of Surgery: Shower with CHG soap. Do not wear jewelry, make up, nail polish, gel polish, artificial nails, or any other type of covering on natural nails including finger and toenails. If patients have artificial nails, gel coating, etc. that need to be removed by a nail salon please have this removed prior to surgery. Surgery may need to be canceled/delayed if the surgeon/anesthesiologist feels like the patient is unable to be adequately monitored. Do not wear lotions, powders, perfumes, or deodorant. Do not shave 48 hours prior to surgery.   Do not bring valuables to the hospital. [facility] is not responsible for any belongings or valuables. Wear Clean/Comfortable clothing the morning of surgery Remember to brush your teeth WITH YOUR REGULAR TOOTHPASTE.   Please read over the following fact sheets that you were given.   3 days prior to your procedure or After your COVID test   You are not required to quarantine however you are required to wear a well-fitting mask when you are out and around people not in your household. If your mask becomes wet or soiled, replace with a new one.   Wash your hands often with soap and water for 20 seconds or clean your hands with an alcohol-based hand sanitizer that contains at least 60% alcohol.   Do not share personal items.   Notify your provider:  o if you are in close contact with someone who has COVID  o or if you develop a fever of 100.4 or greater, sneezing, cough, sore throat, shortness of breath or body aches.

## 2021-03-10 NOTE — Telephone Encounter (Signed)
I have called and spoke with patient twice in regards to this matter, called patient initially on 03/07/2021 to discuss her concerns.  I also called patient yesterday, 03/09/2021 to discuss as well.  I discussed with the patient that we have reached out to Dr. Kipp Brood for his recommendation and input as well.  She reports she is not having any symptoms, denies any shortness of breath, chest pain.  She denies any upper respiratory infections, no cough, congestion.  She reports she feels well.  She is hopeful that we will be able to move forward with surgery.  I have discussed with her that she may need referral to pulmonology for more specified care.  She reports she saw her PCP recently.

## 2021-03-13 ENCOUNTER — Encounter (HOSPITAL_COMMUNITY): Payer: Self-pay

## 2021-03-13 ENCOUNTER — Ambulatory Visit (HOSPITAL_COMMUNITY)
Admission: RE | Admit: 2021-03-13 | Discharge: 2021-03-13 | Disposition: A | Discharge status: Home / Self Care | Payer: BC Managed Care – PPO | Source: Ambulatory Visit | Attending: Thoracic Surgery (Cardiothoracic Vascular Surgery) | Admitting: Thoracic Surgery (Cardiothoracic Vascular Surgery)

## 2021-03-13 ENCOUNTER — Other Ambulatory Visit: Payer: Self-pay

## 2021-03-13 ENCOUNTER — Encounter (HOSPITAL_COMMUNITY)
Admission: RE | Admit: 2021-03-13 | Discharge: 2021-03-13 | Disposition: A | Discharge status: Home / Self Care | Payer: BC Managed Care – PPO | Source: Ambulatory Visit | Attending: Plastic Surgery | Admitting: Plastic Surgery

## 2021-03-13 VITALS — BP 131/73 | HR 55 | Temp 97.8°F | Resp 17 | Ht 62.5 in | Wt 97.1 lb

## 2021-03-13 DIAGNOSIS — F909 Attention-deficit hyperactivity disorder, unspecified type: Secondary | ICD-10-CM | POA: Insufficient documentation

## 2021-03-13 DIAGNOSIS — S21109A Unspecified open wound of unspecified front wall of thorax without penetration into thoracic cavity, initial encounter: Secondary | ICD-10-CM

## 2021-03-13 DIAGNOSIS — Z20822 Contact with and (suspected) exposure to covid-19: Secondary | ICD-10-CM | POA: Insufficient documentation

## 2021-03-13 DIAGNOSIS — X58XXXA Exposure to other specified factors, initial encounter: Secondary | ICD-10-CM | POA: Insufficient documentation

## 2021-03-13 DIAGNOSIS — E785 Hyperlipidemia, unspecified: Secondary | ICD-10-CM | POA: Insufficient documentation

## 2021-03-13 DIAGNOSIS — H409 Unspecified glaucoma: Secondary | ICD-10-CM | POA: Insufficient documentation

## 2021-03-13 DIAGNOSIS — Z01818 Encounter for other preprocedural examination: Secondary | ICD-10-CM

## 2021-03-13 DIAGNOSIS — I6529 Occlusion and stenosis of unspecified carotid artery: Secondary | ICD-10-CM | POA: Insufficient documentation

## 2021-03-13 HISTORY — DX: Pneumonia, unspecified organism: J18.9

## 2021-03-13 HISTORY — DX: Attention-deficit hyperactivity disorder, unspecified type: F90.9

## 2021-03-13 HISTORY — DX: Nausea with vomiting, unspecified: R11.2

## 2021-03-13 HISTORY — DX: Other specified postprocedural states: Z98.890

## 2021-03-13 HISTORY — DX: Disorder of arteries and arterioles, unspecified: I77.9

## 2021-03-13 LAB — BLOOD GAS, ARTERIAL
Acid-base deficit: 0.3 mmol/L (ref 0.0–2.0)
Bicarbonate: 23.5 mmol/L (ref 20.0–28.0)
Drawn by: 58793
FIO2: 21
O2 Saturation: 99.2 %
Patient temperature: 37
pCO2 arterial: 35.8 mmHg (ref 32.0–48.0)
pH, Arterial: 7.432 (ref 7.350–7.450)
pO2, Arterial: 143 mmHg — ABNORMAL HIGH (ref 83.0–108.0)

## 2021-03-13 LAB — COMPREHENSIVE METABOLIC PANEL
ALT: 18 U/L (ref 0–44)
AST: 22 U/L (ref 15–41)
Albumin: 4 g/dL (ref 3.5–5.0)
Alkaline Phosphatase: 61 U/L (ref 38–126)
Anion gap: 8 (ref 5–15)
BUN: 8 mg/dL (ref 8–23)
CO2: 23 mmol/L (ref 22–32)
Calcium: 9.5 mg/dL (ref 8.9–10.3)
Chloride: 106 mmol/L (ref 98–111)
Creatinine, Ser: 0.69 mg/dL (ref 0.44–1.00)
GFR, Estimated: >60 mL/min (ref >60)
Glucose, Bld: 102 mg/dL — ABNORMAL HIGH (ref 70–99)
Potassium: 4 mmol/L (ref 3.5–5.1)
Sodium: 137 mmol/L (ref 135–145)
Total Bilirubin: 0.5 mg/dL (ref 0.3–1.2)
Total Protein: 7.1 g/dL (ref 6.5–8.1)

## 2021-03-13 LAB — TYPE AND SCREEN
ABO/RH(D): O NEG
Antibody Screen: NEGATIVE

## 2021-03-13 LAB — URINALYSIS, ROUTINE W REFLEX MICROSCOPIC
Bilirubin Urine: NEGATIVE
Glucose, UA: NEGATIVE mg/dL
Hgb urine dipstick: NEGATIVE
Ketones, ur: 5 mg/dL — AB
Leukocytes,Ua: NEGATIVE
Nitrite: NEGATIVE
Protein, ur: NEGATIVE mg/dL
Specific Gravity, Urine: 1.02 (ref 1.005–1.030)
pH: 5 (ref 5.0–8.0)

## 2021-03-13 LAB — CBC
HCT: 36.4 % (ref 36.0–46.0)
Hemoglobin: 13.1 g/dL (ref 12.0–15.0)
MCH: 35.8 pg — ABNORMAL HIGH (ref 26.0–34.0)
MCHC: 36 g/dL (ref 30.0–36.0)
MCV: 99.5 fL (ref 80.0–100.0)
Platelets: 168 10*3/uL (ref 150–400)
RBC: 3.66 MIL/uL — ABNORMAL LOW (ref 3.87–5.11)
RDW: 11.6 % (ref 11.5–15.5)
WBC: 5.1 10*3/uL (ref 4.0–10.5)
nRBC: 0 % (ref 0.0–0.2)

## 2021-03-13 LAB — APTT: aPTT: 30 seconds (ref 24–36)

## 2021-03-13 LAB — PROTIME-INR
INR: 1 (ref 0.8–1.2)
Prothrombin Time: 13.5 seconds (ref 11.4–15.2)

## 2021-03-13 LAB — SURGICAL PCR SCREEN
MRSA, PCR: NEGATIVE
Staphylococcus aureus: NEGATIVE

## 2021-03-13 NOTE — Progress Notes (Addendum)
PCP - Dr. Margaretha Sheffield Cardiologist - denies  PPM/ICD - n/a  Chest x-ray - 03/13/21 EKG - 03/13/21 Stress Test - denies ECHO - denies Cardiac Cath - denies  Sleep Study - denies CPAP - denies  Blood Thinner Instructions: n/a Aspirin Instructions: LD 10 days ago  NPO at midnight  COVID TEST- 03/13/21, done in PAT  Anesthesia review: Yes, cancer treatment.  Patient denies shortness of breath, fever, cough and chest pain at PAT appointment   All instructions explained to the patient, with a verbal understanding of the material. Patient agrees to go over the instructions while at home for a better understanding. Patient also instructed to self quarantine after being tested for COVID-19. The opportunity to ask questions was provided.

## 2021-03-14 ENCOUNTER — Encounter (HOSPITAL_COMMUNITY): Payer: Self-pay

## 2021-03-14 LAB — SARS CORONAVIRUS 2 (TAT 6-24 HRS): SARS Coronavirus 2: NEGATIVE

## 2021-03-14 NOTE — Anesthesia Preprocedure Evaluation (Addendum)
Anesthesia Evaluation  Patient identified by MRN, date of birth, ID band Patient awake    Reviewed: Allergy & Precautions, NPO status , Patient's Chart, lab work & pertinent test results  Airway Mallampati: II  TM Distance: >3 FB     Dental   Pulmonary pneumonia,    breath sounds clear to auscultation       Cardiovascular negative cardio ROS   Rhythm:Regular Rate:Normal     Neuro/Psych PSYCHIATRIC DISORDERS    GI/Hepatic Neg liver ROS,   Endo/Other  negative endocrine ROS  Renal/GU negative Renal ROS     Musculoskeletal   Abdominal   Peds  Hematology   Anesthesia Other Findings   Reproductive/Obstetrics                            Anesthesia Physical Anesthesia Plan  ASA: 3  Anesthesia Plan: General   Post-op Pain Management:    Induction:   PONV Risk Score and Plan: 3 and Dexamethasone, Ondansetron and Midazolam  Airway Management Planned: Oral ETT  Additional Equipment: Arterial line  Intra-op Plan:   Post-operative Plan: Possible Post-op intubation/ventilation  Informed Consent: I have reviewed the patients History and Physical, chart, labs and discussed the procedure including the risks, benefits and alternatives for the proposed anesthesia with the patient or authorized representative who has indicated his/her understanding and acceptance.     Dental advisory given  Plan Discussed with: Anesthesiologist, CRNA and Surgeon  Anesthesia Plan Comments: (PAT note written 03/14/2021 by Myra Gianotti, PA-C. )      Anesthesia Quick Evaluation

## 2021-03-14 NOTE — Progress Notes (Signed)
Anesthesia Chart Review:  Case: [id number] Date/Time: 03/15/21 0715   Procedures:      Latissimus muscle flap for chest wall reconstruction (Chest) - 4 hours     CHEST WALL tumor  EXCISION including ribs (Chest)   Anesthesia type: General   Pre-op diagnosis: Open wound of chest wall, unspecified laterality, initial encounter   Location: [facility] OR ROOM 38 / [facility]   Surgeons: Wallace Going, DO; Lajuana Matte, MD       DISCUSSION: Patient is a 61 year old female scheduled for the above procedure.  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   ASA is currently on hold > 10 days. Dr. Burr Medico advised to not hold Letrozole for surgery.   03/13/21 preoperative COVID-19 test was negative. Anesthesia team to evaluated on the day of surgery. 03/13/21 CXR is still in process.    VS: BP 131/73    Pulse (!) 55    Temp 36.6 C (Oral)    Resp 17    Ht 5' 2.5" (1.588 m)    Wt 44 kg    LMP 11/29/2006 (Exact Date)    SpO2 100%    BMI 17.48 kg/m    PROVIDERS: Pomposini, Cherly Anderson, MD is PCP [place], [place]; phone [phone], fax [phone]) [name], MD is HEM-ONC. Last visit 01/17/21. Zometa received. Continue Letrozole. Was considering reconstruction of left chest post-radiation wound.  Kyung Rudd, MD is RAD-ONC Deitra Mayo, MD is vascular surgeon. Seen on 02/09/21 for bilateral carotid bruits. Duplex findings as below. One year follow-up planned.   LABS: Labs reviewed: Acceptable for surgery. (all labs ordered are listed, but only abnormal results are displayed)  Labs Reviewed  CBC - Abnormal; Notable for the following components:      Result Value    RBC 3.66 (*)    MCH 35.8 (*)    All other components within normal limits  COMPREHENSIVE METABOLIC PANEL - Abnormal; Notable for the following components:   Glucose, Bld 102 (*)    All other components within normal limits  BLOOD GAS, ARTERIAL - Abnormal; Notable for the following components:   pO2, Arterial 143 (*)    All other components within normal limits  URINALYSIS, ROUTINE W REFLEX MICROSCOPIC - Abnormal; Notable for the following components:   APPearance HAZY (*)    Ketones, ur 5 (*)    All other components within normal limits  SARS CORONAVIRUS 2 (TAT 6-24 HRS)  SURGICAL PCR SCREEN  PROTIME-INR  APTT  TYPE AND SCREEN     IMAGES: CXR 03/13/21: In process.  CT Chest 02/21/21: IMPRESSION: 1. Stable postsurgical changes in the left breast and left axilla. No evidence for recurrent mass, lymphadenopathy or fluid collection. 2. New right middle lobe peripheral mucous plugging, tree-in-bud opacities and nodular densities measuring up to 5 mm. Findings are likely infectious/inflammatory. No follow-up needed if patient is low-risk (and has no known or suspected primary neoplasm). Non-contrast chest CT can be considered in 12 months if patient is high-risk. This recommendation follows the consensus statement: Guidelines for Management of Incidental Pulmonary Nodules Detected on CT Images: From the Fleischner Society 2017; Radiology 2017; 284:228-243. 3. Stable post radiation changes in the left upper lobe. 4. Cholelithiasis. 5. Nonobstructing left renal calculus.  EKG: 03/13/20:  Normal sinus rhythm Normal ECG Confirmed by Eleonore Chiquito ([id number]) on 03/13/2021 8:13:01 PM   CV: US Carotid 02/09/21: Summary:  - Right Carotid: There is no evidence of stenosis in the right ICA.  - Left Carotid: Velocities in the left ICA are consistent with a 40-59%  stenosis. 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  - Vertebrals:   Bilateral vertebral arteries demonstrate antegrade flow.  - Subclavians: Normal flow hemodynamics were seen in bilateral subclavian arteries.    Past Medical History:  Diagnosis Date   ADHD (attention deficit hyperactivity disorder)    Allergy    seasonal allergies   Anxiety    on meds   Breast cancer ([place]) 07/03/2016   Cancer of left breast ([place])    [facility] genetic testing negative on 9/89/21   Complication of anesthesia    Vagals easily to pain   Diverticulitis    Genetic testing 06/18/2016   Ms. Egger underwent genetic counseling and testing for hereditary cancer syndromes on 05/31/2016. Her results were negative for mutations in all 46 genes analyzed by [facility]'s 46-gene Common Hereditary Cancers Panel. Genes analyzed include: APC, ATM, AXIN2, BARD1, BMPR1A, BRCA1, BRCA2, BRIP1, CDH1, CDKN2A, CHEK2, CTNNA1, DICER1, EPCAM, GREM1, HOXB13, KIT, MEN1, MLH1, MSH2, MSH3, MSH6, MUTYH, NBN,   Glaucoma    on meds   Hyperlipidemia    on meds   Personal history of radiation therapy 2018   Left Breast Cancer   Pneumonia    PONV (postoperative nausea and vomiting)    Skin cancer    left breast     Past Surgical History:  Procedure Laterality Date   BREAST EXCISIONAL BIOPSY Left 04/2020   BREAST LUMPECTOMY Left 07/03/2016   BREAST LUMPECTOMY WITH RADIOACTIVE SEED AND SENTINEL LYMPH NODE BIOPSY Left 07/04/2016   Procedure: LEFT BREAST LUMPECTOMY WITH RADIOACTIVE SEED X 2 AND SENTINEL LYMPH NODE BIOPSY;  Surgeon: Stark Klein, MD;  Location: [place];  Service: General;  Laterality: Left;   EYE SURGERY Bilateral 11/25/2020   cataract surgery   SKIN BIOPSY Left 05/17/2020   Procedure: INCISIONAL BIOPSY LEFT  CHEST WALL MASS;  Surgeon: Stark Klein, MD;  Location: [place];  Service: General;  Laterality: Left;   surgery on right leg Right    melonoma   TONSILLECTOMY     WISDOM TOOTH EXTRACTION  1985    MEDICATIONS:  acetaminophen (TYLENOL) 500  MG tablet   ALPRAZolam (XANAX) 0.25 MG tablet   aspirin EC 81 MG tablet   atorvastatin (LIPITOR) 20 MG tablet   buPROPion (WELLBUTRIN XL) 150 MG 24 hr tablet   Calcium Carb-Cholecalciferol (CALCIUM-VITAMIN D) 500-200 MG-UNIT tablet   cholecalciferol (VITAMIN D) 1000 units tablet   docusate sodium (COLACE) 100 MG capsule   EPINEPHrine 0.3 mg/0.3 mL IJ SOAJ injection   hyaluronate sodium (RADIAPLEXRX) GEL   ibuprofen (ADVIL,MOTRIN) 200 MG tablet   latanoprost (XALATAN) 0.005 % ophthalmic solution   letrozole (FEMARA) 2.5 MG tablet   loratadine (CLARITIN) 5 MG chewable tablet   LORazepam (ATIVAN) 1 MG tablet   Multiple Vitamin (STRESS FORMULA PO)   ondansetron (ZOFRAN) 4 MG tablet   potassium chloride (KLOR-CON) 20 MEQ packet   PROCTO-MED HC 2.5 % rectal cream   Saccharomyces boulardii (DIGESTIVE PROBIOTIC PO)   timolol (TIMOPTIC) 0.5 % ophthalmic solution   traMADol (ULTRAM) 50 MG tablet   zoledronic acid (RECLAST) 5 MG/100ML SOLN injection   zolpidem (  AMBIEN) 5 MG tablet   No current facility-administered medications for this encounter.    Myra Gianotti, PA-C Surgical Short Stay/Anesthesiology [facility] Phone [phone] [facility] Phone [phone] 03/14/2021 10:24 AM

## 2021-03-15 ENCOUNTER — Inpatient Hospital Stay (HOSPITAL_COMMUNITY): Payer: BC Managed Care – PPO | Admitting: Vascular Surgery

## 2021-03-15 ENCOUNTER — Other Ambulatory Visit: Payer: Self-pay

## 2021-03-15 ENCOUNTER — Encounter (HOSPITAL_COMMUNITY)
Admission: RE | Disposition: A | Discharge status: Home / Self Care | Payer: Self-pay | Source: Home / Self Care | Attending: Plastic Surgery

## 2021-03-15 ENCOUNTER — Inpatient Hospital Stay (HOSPITAL_COMMUNITY): Payer: BC Managed Care – PPO | Admitting: Certified Registered Nurse Anesthetist

## 2021-03-15 ENCOUNTER — Encounter (HOSPITAL_COMMUNITY): Payer: Self-pay | Admitting: Plastic Surgery

## 2021-03-15 ENCOUNTER — Inpatient Hospital Stay (HOSPITAL_COMMUNITY)
Admission: RE | Admit: 2021-03-15 | Discharge: 2021-03-17 | DRG: 908 | Disposition: A | Discharge status: Home / Self Care | Payer: BC Managed Care – PPO | Attending: Plastic Surgery | Admitting: Plastic Surgery

## 2021-03-15 DIAGNOSIS — Z9012 Acquired absence of left breast and nipple: Secondary | ICD-10-CM

## 2021-03-15 DIAGNOSIS — Z91040 Latex allergy status: Secondary | ICD-10-CM

## 2021-03-15 DIAGNOSIS — T8189XA Other complications of procedures, not elsewhere classified, initial encounter: Principal | ICD-10-CM | POA: Diagnosis present

## 2021-03-15 DIAGNOSIS — E440 Moderate protein-calorie malnutrition: Secondary | ICD-10-CM | POA: Diagnosis present

## 2021-03-15 DIAGNOSIS — Z91030 Bee allergy status: Secondary | ICD-10-CM

## 2021-03-15 DIAGNOSIS — Z85828 Personal history of other malignant neoplasm of skin: Secondary | ICD-10-CM

## 2021-03-15 DIAGNOSIS — J302 Other seasonal allergic rhinitis: Secondary | ICD-10-CM | POA: Diagnosis present

## 2021-03-15 DIAGNOSIS — T2101XA Burn of unspecified degree of chest wall, initial encounter: Secondary | ICD-10-CM | POA: Diagnosis not present

## 2021-03-15 DIAGNOSIS — Z923 Personal history of irradiation: Secondary | ICD-10-CM

## 2021-03-15 DIAGNOSIS — Z79899 Other long term (current) drug therapy: Secondary | ICD-10-CM | POA: Diagnosis not present

## 2021-03-15 DIAGNOSIS — Z881 Allergy status to other antibiotic agents status: Secondary | ICD-10-CM | POA: Diagnosis not present

## 2021-03-15 DIAGNOSIS — S21109A Unspecified open wound of unspecified front wall of thorax without penetration into thoracic cavity, initial encounter: Secondary | ICD-10-CM

## 2021-03-15 DIAGNOSIS — F419 Anxiety disorder, unspecified: Secondary | ICD-10-CM | POA: Diagnosis present

## 2021-03-15 DIAGNOSIS — S21102A Unspecified open wound of left front wall of thorax without penetration into thoracic cavity, initial encounter: Secondary | ICD-10-CM | POA: Diagnosis present

## 2021-03-15 DIAGNOSIS — H409 Unspecified glaucoma: Secondary | ICD-10-CM | POA: Diagnosis present

## 2021-03-15 DIAGNOSIS — Z681 Body mass index (BMI) 19 or less, adult: Secondary | ICD-10-CM

## 2021-03-15 DIAGNOSIS — Z882 Allergy status to sulfonamides status: Secondary | ICD-10-CM

## 2021-03-15 DIAGNOSIS — Z20822 Contact with and (suspected) exposure to covid-19: Secondary | ICD-10-CM | POA: Diagnosis present

## 2021-03-15 DIAGNOSIS — Z7982 Long term (current) use of aspirin: Secondary | ICD-10-CM

## 2021-03-15 DIAGNOSIS — Y842 Radiological procedure and radiotherapy as the cause of abnormal reaction of the patient, or of later complication, without mention of misadventure at the time of the procedure: Secondary | ICD-10-CM | POA: Diagnosis present

## 2021-03-15 DIAGNOSIS — Z803 Family history of malignant neoplasm of breast: Secondary | ICD-10-CM

## 2021-03-15 DIAGNOSIS — Z853 Personal history of malignant neoplasm of breast: Secondary | ICD-10-CM

## 2021-03-15 DIAGNOSIS — Z888 Allergy status to other drugs, medicaments and biological substances status: Secondary | ICD-10-CM

## 2021-03-15 DIAGNOSIS — E785 Hyperlipidemia, unspecified: Secondary | ICD-10-CM | POA: Diagnosis present

## 2021-03-15 DIAGNOSIS — Z79891 Long term (current) use of opiate analgesic: Secondary | ICD-10-CM

## 2021-03-15 HISTORY — PX: MASS EXCISION: SHX2000

## 2021-03-15 HISTORY — PX: LATISSIMUS FLAP TO BREAST: SHX5357

## 2021-03-15 LAB — ABO/RH: ABO/RH(D): O NEG

## 2021-03-15 LAB — HIV ANTIBODY (ROUTINE TESTING W REFLEX): HIV Screen 4th Generation wRfx: NONREACTIVE

## 2021-03-15 SURGERY — RECONSTRUCTION, BREAST, USING LATISSIMUS DORSI MYOCUTANEOUS FLAP
Anesthesia: General | Site: Chest

## 2021-03-15 MED ORDER — LIDOCAINE 2% (20 MG/ML) 5 ML SYRINGE
INTRAMUSCULAR | Status: DC | PRN
Start: 2021-03-15 — End: 2021-03-15
  Administered 2021-03-15: 40 mg via INTRAVENOUS

## 2021-03-15 MED ORDER — PROPOFOL 10 MG/ML IV BOLUS
INTRAVENOUS | Status: AC
  Filled 2021-03-15: qty 20

## 2021-03-15 MED ORDER — CEFAZOLIN SODIUM-DEXTROSE 2-4 GM/100ML-% IV SOLN
2.0000 g | INTRAVENOUS | Status: AC
  Administered 2021-03-15: 08:00:00 2 g via INTRAVENOUS

## 2021-03-15 MED ORDER — FENTANYL CITRATE (PF) 100 MCG/2ML IJ SOLN
INTRAMUSCULAR | Status: AC
  Filled 2021-03-15: qty 2

## 2021-03-15 MED ORDER — FENTANYL CITRATE (PF) 250 MCG/5ML IJ SOLN
INTRAMUSCULAR | Status: AC
  Filled 2021-03-15: qty 5

## 2021-03-15 MED ORDER — ORAL CARE MOUTH RINSE: 15.0000 mL | Freq: Once | OROMUCOSAL | Status: AC

## 2021-03-15 MED ORDER — LACTATED RINGERS IV SOLN: INTRAVENOUS | Status: DC

## 2021-03-15 MED ORDER — AMISULPRIDE (ANTIEMETIC) 5 MG/2ML IV SOLN
INTRAVENOUS | Status: AC
  Filled 2021-03-15: qty 2

## 2021-03-15 MED ORDER — POLYETHYLENE GLYCOL 3350 17 G PO PACK: 17.0000 g | PACK | Freq: Every day | ORAL | Status: DC | PRN

## 2021-03-15 MED ORDER — KCL IN DEXTROSE-NACL 20-5-0.45 MEQ/L-%-% IV SOLN
INTRAVENOUS | Status: DC
  Filled 2021-03-15 (×5): qty 1000

## 2021-03-15 MED ORDER — ONDANSETRON HCL 4 MG/2ML IJ SOLN
4.0000 mg | Freq: Four times a day (QID) | INTRAMUSCULAR | Status: DC | PRN
  Administered 2021-03-17: 4 mg via INTRAVENOUS
  Filled 2021-03-15: qty 2

## 2021-03-15 MED ORDER — AMISULPRIDE (ANTIEMETIC) 5 MG/2ML IV SOLN
5.0000 mg | Freq: Once | INTRAVENOUS | Status: DC
  Administered 2021-03-15: 13:00:00 5 mg via INTRAVENOUS

## 2021-03-15 MED ORDER — FENTANYL CITRATE (PF) 100 MCG/2ML IJ SOLN: 25.0000 ug | INTRAMUSCULAR | Status: DC | PRN

## 2021-03-15 MED ORDER — TRAMADOL HCL 50 MG PO TABS: 50.0000 mg | ORAL_TABLET | Freq: Two times a day (BID) | ORAL | Status: DC | PRN

## 2021-03-15 MED ORDER — BUPIVACAINE HCL (PF) 0.25 % IJ SOLN
INTRAMUSCULAR | Status: AC
  Filled 2021-03-15: qty 30

## 2021-03-15 MED ORDER — FENTANYL CITRATE (PF) 100 MCG/2ML IJ SOLN
25.0000 ug | INTRAMUSCULAR | Status: DC | PRN
  Administered 2021-03-15 (×2): 25 ug via INTRAVENOUS

## 2021-03-15 MED ORDER — BUPIVACAINE LIPOSOME 1.3 % IJ SUSP
INTRAMUSCULAR | Status: AC
  Filled 2021-03-15: qty 20

## 2021-03-15 MED ORDER — DIPHENHYDRAMINE HCL 50 MG/ML IJ SOLN
12.5000 mg | Freq: Four times a day (QID) | INTRAMUSCULAR | Status: DC | PRN
  Administered 2021-03-16 (×2): 12.5 mg via INTRAVENOUS
  Filled 2021-03-15 (×2): qty 1

## 2021-03-15 MED ORDER — 0.9 % SODIUM CHLORIDE (POUR BTL) OPTIME
TOPICAL | Status: DC | PRN
Start: 2021-03-15 — End: 2021-03-15
  Administered 2021-03-15: 07:00:00 2000 mL

## 2021-03-15 MED ORDER — DIPHENHYDRAMINE HCL 12.5 MG/5ML PO ELIX: 12.5000 mg | ORAL_SOLUTION | Freq: Four times a day (QID) | ORAL | Status: DC | PRN

## 2021-03-15 MED ORDER — MIDAZOLAM HCL 5 MG/5ML IJ SOLN
INTRAMUSCULAR | Status: DC | PRN
  Administered 2021-03-15: 2 mg via INTRAVENOUS

## 2021-03-15 MED ORDER — PHENYLEPHRINE 40 MCG/ML (10ML) SYRINGE FOR IV PUSH (FOR BLOOD PRESSURE SUPPORT)
PREFILLED_SYRINGE | INTRAVENOUS | Status: AC
  Filled 2021-03-15: qty 10

## 2021-03-15 MED ORDER — BUPIVACAINE HCL 0.25 % IJ SOLN
INTRAMUSCULAR | Status: DC | PRN
  Administered 2021-03-15: 11 mL

## 2021-03-15 MED ORDER — ACETAMINOPHEN 325 MG PO TABS
325.0000 mg | ORAL_TABLET | Freq: Four times a day (QID) | ORAL | Status: DC
  Administered 2021-03-15 – 2021-03-17 (×7): 325 mg via ORAL
  Filled 2021-03-15 (×7): qty 1

## 2021-03-15 MED ORDER — ONDANSETRON 4 MG PO TBDP
4.0000 mg | ORAL_TABLET | Freq: Four times a day (QID) | ORAL | Status: DC | PRN
  Administered 2021-03-17: 4 mg via ORAL
  Filled 2021-03-15: qty 1

## 2021-03-15 MED ORDER — CHLORHEXIDINE GLUCONATE 0.12 % MT SOLN
OROMUCOSAL | Status: AC
  Administered 2021-03-15: 06:00:00 15 mL via OROMUCOSAL
  Filled 2021-03-15: qty 15

## 2021-03-15 MED ORDER — CHLORHEXIDINE GLUCONATE 0.12 % MT SOLN: 15.0000 mL | Freq: Once | OROMUCOSAL | Status: AC

## 2021-03-15 MED ORDER — HYDROMORPHONE HCL 1 MG/ML IJ SOLN: 0.2500 mg | INTRAMUSCULAR | Status: DC | PRN

## 2021-03-15 MED ORDER — CHLORHEXIDINE GLUCONATE CLOTH 2 % EX PADS: 6.0000 | MEDICATED_PAD | Freq: Once | CUTANEOUS | Status: DC

## 2021-03-15 MED ORDER — ALBUMIN HUMAN 5 % IV SOLN: INTRAVENOUS | Status: DC | PRN

## 2021-03-15 MED ORDER — PROPOFOL 1000 MG/100ML IV EMUL
INTRAVENOUS | Status: AC
  Filled 2021-03-15: qty 100

## 2021-03-15 MED ORDER — PROPOFOL 10 MG/ML IV BOLUS
INTRAVENOUS | Status: DC | PRN
  Administered 2021-03-15: 140 mg via INTRAVENOUS

## 2021-03-15 MED ORDER — METHOCARBAMOL 500 MG PO TABS
500.0000 mg | ORAL_TABLET | Freq: Four times a day (QID) | ORAL | Status: DC | PRN
  Administered 2021-03-16 – 2021-03-17 (×3): 500 mg via ORAL
  Filled 2021-03-15 (×3): qty 1

## 2021-03-15 MED ORDER — ONDANSETRON HCL 4 MG/2ML IJ SOLN
INTRAMUSCULAR | Status: AC
  Filled 2021-03-15: qty 2

## 2021-03-15 MED ORDER — ROCURONIUM BROMIDE 10 MG/ML (PF) SYRINGE
PREFILLED_SYRINGE | INTRAVENOUS | Status: DC | PRN
Start: 2021-03-15 — End: 2021-03-15
  Administered 2021-03-15 (×2): 20 mg via INTRAVENOUS
  Administered 2021-03-15: 50 mg via INTRAVENOUS

## 2021-03-15 MED ORDER — PHENYLEPHRINE HCL-NACL 20-0.9 MG/250ML-% IV SOLN
INTRAVENOUS | Status: DC | PRN
  Administered 2021-03-15: 25 ug/min via INTRAVENOUS

## 2021-03-15 MED ORDER — CEFAZOLIN SODIUM-DEXTROSE 1-4 GM/50ML-% IV SOLN
1.0000 g | Freq: Three times a day (TID) | INTRAVENOUS | Status: DC
  Administered 2021-03-15 – 2021-03-17 (×5): 1 g via INTRAVENOUS
  Filled 2021-03-15 (×9): qty 50

## 2021-03-15 MED ORDER — CEFAZOLIN SODIUM-DEXTROSE 2-4 GM/100ML-% IV SOLN: 2.0000 g | INTRAVENOUS | Status: DC

## 2021-03-15 MED ORDER — DEXAMETHASONE SODIUM PHOSPHATE 10 MG/ML IJ SOLN
INTRAMUSCULAR | Status: AC
  Filled 2021-03-15: qty 1

## 2021-03-15 MED ORDER — MIDAZOLAM HCL 2 MG/2ML IJ SOLN
INTRAMUSCULAR | Status: AC
  Filled 2021-03-15: qty 2

## 2021-03-15 MED ORDER — SENNA 8.6 MG PO TABS
1.0000 | ORAL_TABLET | Freq: Two times a day (BID) | ORAL | Status: DC
  Administered 2021-03-15 – 2021-03-17 (×4): 8.6 mg via ORAL
  Filled 2021-03-15 (×4): qty 1

## 2021-03-15 MED ORDER — FENTANYL CITRATE (PF) 250 MCG/5ML IJ SOLN
INTRAMUSCULAR | Status: DC | PRN
Start: 2021-03-15 — End: 2021-03-15
  Administered 2021-03-15 (×2): 50 ug via INTRAVENOUS
  Administered 2021-03-15: 25 ug via INTRAVENOUS
  Administered 2021-03-15: 100 ug via INTRAVENOUS
  Administered 2021-03-15: 50 ug via INTRAVENOUS
  Administered 2021-03-15: 25 ug via INTRAVENOUS

## 2021-03-15 MED ORDER — LACTATED RINGERS IV SOLN: INTRAVENOUS | Status: DC | PRN

## 2021-03-15 MED ORDER — SUGAMMADEX SODIUM 200 MG/2ML IV SOLN
INTRAVENOUS | Status: DC | PRN
  Administered 2021-03-15: 100 mg via INTRAVENOUS

## 2021-03-15 MED ORDER — HYDROCODONE-ACETAMINOPHEN 5-325 MG PO TABS
1.0000 | ORAL_TABLET | ORAL | Status: DC | PRN
  Administered 2021-03-15 – 2021-03-16 (×2): 2 via ORAL
  Filled 2021-03-15 (×2): qty 2

## 2021-03-15 MED ORDER — MORPHINE SULFATE (PF) 2 MG/ML IV SOLN
1.0000 mg | INTRAVENOUS | Status: DC | PRN
  Administered 2021-03-15 (×2): 1 mg via INTRAVENOUS
  Filled 2021-03-15 (×2): qty 1

## 2021-03-15 MED ORDER — DEXAMETHASONE SODIUM PHOSPHATE 10 MG/ML IJ SOLN
INTRAMUSCULAR | Status: DC | PRN
  Administered 2021-03-15: 5 mg via INTRAVENOUS

## 2021-03-15 MED ORDER — LIDOCAINE-EPINEPHRINE (PF) 1 %-1:200000 IJ SOLN
INTRAMUSCULAR | Status: AC
  Filled 2021-03-15: qty 30

## 2021-03-15 MED ORDER — LIDOCAINE 2% (20 MG/ML) 5 ML SYRINGE
INTRAMUSCULAR | Status: AC
  Filled 2021-03-15: qty 5

## 2021-03-15 MED ORDER — SCOPOLAMINE 1 MG/3DAYS TD PT72
MEDICATED_PATCH | TRANSDERMAL | Status: DC | PRN
  Administered 2021-03-15: 1 via TRANSDERMAL

## 2021-03-15 MED ORDER — ROCURONIUM BROMIDE 10 MG/ML (PF) SYRINGE
PREFILLED_SYRINGE | INTRAVENOUS | Status: AC
  Filled 2021-03-15: qty 10

## 2021-03-15 MED ORDER — CEFAZOLIN SODIUM-DEXTROSE 2-4 GM/100ML-% IV SOLN
INTRAVENOUS | Status: AC
  Filled 2021-03-15: qty 100

## 2021-03-15 MED ORDER — LIDOCAINE HCL 1 % IJ SOLN
INTRAMUSCULAR | Status: DC | PRN
  Administered 2021-03-15: 8 mL

## 2021-03-15 MED ORDER — PROPOFOL 500 MG/50ML IV EMUL
INTRAVENOUS | Status: DC | PRN
  Administered 2021-03-15: 20 ug/kg/min via INTRAVENOUS

## 2021-03-15 MED ORDER — PHENYLEPHRINE 40 MCG/ML (10ML) SYRINGE FOR IV PUSH (FOR BLOOD PRESSURE SUPPORT)
PREFILLED_SYRINGE | INTRAVENOUS | Status: DC | PRN
  Administered 2021-03-15 (×2): 40 ug via INTRAVENOUS
  Administered 2021-03-15 (×2): 80 ug via INTRAVENOUS

## 2021-03-15 MED ORDER — ONDANSETRON HCL 4 MG/2ML IJ SOLN
INTRAMUSCULAR | Status: DC | PRN
  Administered 2021-03-15: 4 mg via INTRAVENOUS

## 2021-03-15 MED ORDER — BUPIVACAINE HCL 0.5 % IJ SOLN
INTRAMUSCULAR | Status: AC
  Filled 2021-03-15: qty 1

## 2021-03-15 SURGICAL SUPPLY — 113 items
BAG DECANTER FOR FLEXI CONT (MISCELLANEOUS) ×3 IMPLANT
BIOPATCH RED 1 DISK 7.0 (GAUZE/BANDAGES/DRESSINGS) ×6 IMPLANT
BLADE CLIPPER SURG (BLADE) ×3 IMPLANT
BLADE SURG 10 STRL SS (BLADE) ×5 IMPLANT
BLADE SURG 15 STRL LF DISP TIS (BLADE) ×2 IMPLANT
BLADE SURG 15 STRL SS (BLADE) ×1
CANISTER SUCT 3000ML PPV (MISCELLANEOUS) ×6 IMPLANT
CATH KIT ON-Q SILVERSOAK 5 (CATHETERS) IMPLANT
CATH KIT ON-Q SILVERSOAK 5IN (CATHETERS) IMPLANT
CATH THORACIC 28FR (CATHETERS) IMPLANT
CATH THORACIC 36FR (CATHETERS) IMPLANT
CATH THORACIC 36FR RT ANG (CATHETERS) IMPLANT
CHLORAPREP W/TINT 26 (MISCELLANEOUS) ×6 IMPLANT
CLEANER TIP ELECTROSURG 2X2 (MISCELLANEOUS) ×3 IMPLANT
CLIP VESOCCLUDE MED 6/CT (CLIP) IMPLANT
CNTNR URN SCR LID CUP LEK RST (MISCELLANEOUS) ×4 IMPLANT
CONN ST 1/4X3/8  BEN (MISCELLANEOUS)
CONN ST 1/4X3/8 BEN (MISCELLANEOUS) IMPLANT
CONN Y 3/8X3/8X3/8  BEN (MISCELLANEOUS)
CONN Y 3/8X3/8X3/8 BEN (MISCELLANEOUS) IMPLANT
CONNECTOR 5 IN 1 STRAIGHT STRL (MISCELLANEOUS) ×3 IMPLANT
CONNECTOR BLAKE 2:1 CARIO BLK (MISCELLANEOUS) IMPLANT
CONT SPEC 4OZ STRL OR WHT (MISCELLANEOUS) ×2
COVER SURGICAL LIGHT HANDLE (MISCELLANEOUS) ×3 IMPLANT
DECANTER SPIKE VIAL GLASS SM (MISCELLANEOUS) ×3 IMPLANT
DERMABOND ADVANCED (GAUZE/BANDAGES/DRESSINGS) ×2
DERMABOND ADVANCED .7 DNX12 (GAUZE/BANDAGES/DRESSINGS) ×4 IMPLANT
DISSECTOR BLUNT TIP ENDO 5MM (MISCELLANEOUS) IMPLANT
DRAIN CHANNEL 19F RND (DRAIN) ×8 IMPLANT
DRAIN CHANNEL 28F RND 3/8 FF (WOUND CARE) IMPLANT
DRAPE CHEST BREAST 15X10 FENES (DRAPES) ×1 IMPLANT
DRAPE HALF SHEET 40X57 (DRAPES) ×6 IMPLANT
DRAPE INCISE 23X17 IOBAN STRL (DRAPES) ×1
DRAPE INCISE 23X17 STRL (DRAPES) ×2 IMPLANT
DRAPE INCISE IOBAN 23X17 STRL (DRAPES) ×2 IMPLANT
DRAPE INCISE IOBAN 85X60 (DRAPES) IMPLANT
DRAPE ORTHO SPLIT 77X108 STRL (DRAPES) ×2
DRAPE SURG ORHT 6 SPLT 77X108 (DRAPES) ×4 IMPLANT
DRAPE WARM FLUID 44X44 (DRAPES) ×6 IMPLANT
DRESSING MEPILEX FLEX 4X4 (GAUZE/BANDAGES/DRESSINGS) IMPLANT
DRSG MEPILEX BORDER 4X8 (GAUZE/BANDAGES/DRESSINGS) ×3 IMPLANT
DRSG MEPILEX FLEX 4X4 (GAUZE/BANDAGES/DRESSINGS) ×3
DRSG PAD ABDOMINAL 8X10 ST (GAUZE/BANDAGES/DRESSINGS) ×6 IMPLANT
ELECT BLADE 4.0 EZ CLEAN MEGAD (MISCELLANEOUS) ×6
ELECT CAUTERY BLADE 6.4 (BLADE) ×3 IMPLANT
ELECT REM PT RETURN 9FT ADLT (ELECTROSURGICAL) ×6
ELECTRODE BLDE 4.0 EZ CLN MEGD (MISCELLANEOUS) ×4 IMPLANT
ELECTRODE REM PT RTRN 9FT ADLT (ELECTROSURGICAL) ×4 IMPLANT
EVACUATOR SILICONE 100CC (DRAIN) ×8 IMPLANT
GAUZE 4X4 16PLY ~~LOC~~+RFID DBL (SPONGE) ×3 IMPLANT
GAUZE SPONGE 4X4 12PLY STRL (GAUZE/BANDAGES/DRESSINGS) ×9 IMPLANT
GLOVE SURG ENC MOIS LTX SZ6.5 (GLOVE) ×6 IMPLANT
GLOVE SURG ENC TEXT LTX SZ7 (GLOVE) ×3 IMPLANT
GLOVE SURG ENC TEXT LTX SZ7.5 (GLOVE) ×3 IMPLANT
GLOVE SURG POLYISO LF SZ6.5 (GLOVE) ×5 IMPLANT
GLOVE SURG POLYISO LF SZ7.5 (GLOVE) ×1 IMPLANT
GOWN STRL REUS W/ TWL LRG LVL3 (GOWN DISPOSABLE) ×10 IMPLANT
GOWN STRL REUS W/ TWL XL LVL3 (GOWN DISPOSABLE) ×2 IMPLANT
GOWN STRL REUS W/TWL LRG LVL3 (GOWN DISPOSABLE) ×5
GOWN STRL REUS W/TWL XL LVL3 (GOWN DISPOSABLE) ×1
KIT BASIN OR (CUSTOM PROCEDURE TRAY) ×6 IMPLANT
KIT SUCTION CATH 14FR (SUCTIONS) ×3 IMPLANT
KIT TURNOVER KIT B (KITS) ×3 IMPLANT
NDL HYPO 25GX1X1/2 BEV (NEEDLE) ×2 IMPLANT
NEEDLE HYPO 25GX1X1/2 BEV (NEEDLE) ×9 IMPLANT
NS IRRIG 1000ML POUR BTL (IV SOLUTION) ×18 IMPLANT
PACK CHEST (CUSTOM PROCEDURE TRAY) ×3 IMPLANT
PACK GENERAL/GYN (CUSTOM PROCEDURE TRAY) ×3 IMPLANT
PACK UNIVERSAL I (CUSTOM PROCEDURE TRAY) ×3 IMPLANT
PAD ARMBOARD 7.5X6 YLW CONV (MISCELLANEOUS) ×9 IMPLANT
PENCIL BUTTON HOLSTER BLD 10FT (ELECTRODE) ×3 IMPLANT
PIN SAFETY STERILE (MISCELLANEOUS) ×6 IMPLANT
SEALANT SURG COSEAL 8ML (VASCULAR PRODUCTS) IMPLANT
SPONGE T-LAP 18X18 ~~LOC~~+RFID (SPONGE) ×15 IMPLANT
STAPLER VISISTAT 35W (STAPLE) ×3 IMPLANT
STRIP CLOSURE SKIN 1/2X4 (GAUZE/BANDAGES/DRESSINGS) ×2 IMPLANT
SUT MNCRL AB 3-0 PS2 18 (SUTURE) ×10 IMPLANT
SUT MON AB 4-0 PS1 27 (SUTURE) ×6 IMPLANT
SUT PDS AB 2-0 CT1 27 (SUTURE) ×2 IMPLANT
SUT PDS AB 3-0 SH 27 (SUTURE) ×1 IMPLANT
SUT PROLENE 3 0 SH DA (SUTURE) IMPLANT
SUT PROLENE 4 0 RB 1 (SUTURE)
SUT PROLENE 4-0 RB1 .5 CRCL 36 (SUTURE) IMPLANT
SUT SILK  1 MH (SUTURE) ×4
SUT SILK 1 MH (SUTURE) ×8 IMPLANT
SUT SILK 1 TIES 10X30 (SUTURE) IMPLANT
SUT SILK 2 0SH CR/8 30 (SUTURE) IMPLANT
SUT SILK 3 0 (SUTURE) ×2
SUT SILK 3 0SH CR/8 30 (SUTURE) IMPLANT
SUT SILK 3-0 FS1 18XBRD (SUTURE) ×4 IMPLANT
SUT VIC AB 1 CTX 18 (SUTURE) IMPLANT
SUT VIC AB 1 CTX 36 (SUTURE)
SUT VIC AB 1 CTX36XBRD ANBCTR (SUTURE) IMPLANT
SUT VIC AB 2-0 CTX 36 (SUTURE) IMPLANT
SUT VIC AB 3-0 X1 27 (SUTURE) IMPLANT
SUT VICRYL 0 UR6 27IN ABS (SUTURE) IMPLANT
SUT VICRYL 2 TP 1 (SUTURE) IMPLANT
SWAB COLLECTION DEVICE MRSA (MISCELLANEOUS) IMPLANT
SWAB CULTURE ESWAB REG 1ML (MISCELLANEOUS) IMPLANT
SYR 30ML LL (SYRINGE) ×1 IMPLANT
SYR BULB IRRIG 60ML STRL (SYRINGE) ×3 IMPLANT
SYR CONTROL 10ML LL (SYRINGE) ×3 IMPLANT
SYSTEM SAHARA CHEST DRAIN ATS (WOUND CARE) ×3 IMPLANT
TAPE CLOTH 4X10 WHT NS (GAUZE/BANDAGES/DRESSINGS) ×3 IMPLANT
TIP APPLICATOR SPRAY EXTEND 16 (VASCULAR PRODUCTS) IMPLANT
TOWEL GREEN STERILE (TOWEL DISPOSABLE) ×6 IMPLANT
TOWEL GREEN STERILE FF (TOWEL DISPOSABLE) ×6 IMPLANT
TRAP SPECIMEN MUCUS 40CC (MISCELLANEOUS) IMPLANT
TRAY FOLEY SLVR 16FR LF STAT (SET/KITS/TRAYS/PACK) ×3 IMPLANT
TROCAR XCEL 12X100 BLDLESS (ENDOMECHANICALS) ×3 IMPLANT
TUBE CONNECTING 12X1/4 (SUCTIONS) ×3 IMPLANT
WATER STERILE IRR 1000ML POUR (IV SOLUTION) ×6 IMPLANT
YANKAUER SUCT BULB TIP NO VENT (SUCTIONS) ×3 IMPLANT

## 2021-03-15 NOTE — Op Note (Signed)
DATE OF OPERATION: 03/15/2021  LOCATION: [name] Main Operating Room Inpatient   PREOPERATIVE DIAGNOSIS:  Left breast wound. 2.   Left Breast Cancer S/P partial mastectomy. 3.   Left breast radiation burn.  POSTOPERATIVE DIAGNOSIS: Same  PROCEDURE: 1. Latissimus myocutaneous flap to reconstruct the left chest wall 2. Excision of left breast wound: skin 3 x 3 cm, soft tissue/muscle 2 x 2 cm and rib 1 x 2 cm  SURGEON: Sabir Charters [employer], DO  ASSISTANT: Roetta Sessions, PA and Dr. Kipp Brood, MD  EBL: 100 cc  SPECIMEN: None  DRAINS: two total 61 blake round drains  CONDITION: Stable  COMPLICATIONS: None  INDICATION: The patient, Sally Smith, is a 61 y.o. female born on Jan 09, 1961, is here for treatment of a left breast wound after a partial mastectomy and radiation burn.   PROCEDURE DETAILS:  The patient was seen on the morning of her surgery and marked for the location of the flap.  An IV was placed and IV antibiotics were given. The patient was taken to the operating room and placed on the operating room table.  A general anesthetic was administered.  A standard time out was performed and all information was confirmed to be correct by those in the room. Leg compression devices were placed on her legs and a foley catheter placed.  The patient was placed in the supine position.  The chest was prepped and draped.  Local with epinephrine was injected at the site.  The #10 blade was used to open the skin over the area of concern.  There were two prominent staples in place.  The #10 blade was used to excise the muscle and soft tissue including the staples for a 2 x 2 cm area.  The rib cartilage was then excised for a 1 x 2 cm area. The rib appeared intact and not necrotic.  The decision was made with Dr. Kipp Brood to send the tissue to pathology.  An Charlie Pitter was placed over the area for sterility.  The patient was then repositioned in the lateral decubitus position on a beanbag.  All key  prominent points were padded and an axillary roll was placed in the dependent axilla. The ipsilateral arm was placed on a padded rest anteriosuperiorly.  She was then prepped and draped in the standard sterile fashion. The paddle design and position were confirmed.  The procedure began by incising the skin at the marked skin paddle.  The #10 blade and bovie were used to dissect down to the latissimus muscle.  Anterior and posterior flaps were raised to expose the latissimus muscle edges.  The skin and fat flaps were elevated to the extent necessary for release of the muscle.  The muscle was released at the superior edge of the inferior angle of the scapula.  This aids in identification of the serratus muscle to prevent lifting it with the latissimus muscle.  The larger caliber perforator were clipped and the smaller vessels controlled with electrocautery.  The dissection continued toward the midline and inferior toward the iliac crest.  The latissimus muscle was very thin. The subscapular artery to the thoracodorsal artery was palpated in the axilla.  The branch to the serratus is ligated.  Care was taken to protect the vascular pedicle throughout this portion of the procedure. The paddle and muscle looked healthy throughout the case.  Hemostasis was achieved with the electrocautery. A #19 blake round drain was placed in the anterior skin flap and secured with 3-0 Silk.  The back  incision was closed in layers with buried 2-0 PDS, followed by 3-0 PDS and 3-0 Monocryl.  Dermabond and a protective dressing was applied.   The patient was placed in the supine position. The left breast was prepped and draped.  The wound was excised of nonviable skin and the defect was 3 x 3 cm.  The skin flaps were raised to allow for the muscle to be delivered into the site.  The posterior and anterior pockets were then connected superficial to the muscle. The latissimus muscle from the back and the skin paddle were then rotated into  the chest pocket. The flap and skin pedicle were inspected and there was no tension and good color.  Hemostases was achieved with electrocautery. The muscle was then secured superiorly to the pectoralis muscle with 3-0 Monocryl.  A drain was placed and secured with 3-0 Silk. The skin edges were closed with 3-0 Monocryl deep, followed by 4-0 Monocryl for the skin closure.  Dermabond was applied.    The patient was allowed to wake up and taken to recovery room in stable condition at the end of the case. The family was notified at the end of the case.   The advanced practice practitioner (APP) assisted throughout the case.  The APP was essential in retraction and counter traction when needed to make the case progress smoothly.  This retraction and assistance made it possible to see the tissue plans for the procedure.  The assistance was needed for blood control, tissue re-approximation and assisted with closure of the incision site.

## 2021-03-15 NOTE — Progress Notes (Signed)
Patient arrived to [place] room 8 alert and oriented. Bed in lowest position. Call light in reach. Will continue to monitor pt

## 2021-03-15 NOTE — Transfer of Care (Signed)
Immediate Anesthesia Transfer of Care Note  Patient: Sally Smith  Procedure(s) Performed: Latissimus muscle flap for chest wall reconstruction (Chest) CHEST WALL TUMOR EXCISION (Left: Chest)  Patient Location: PACU  Anesthesia Type:General  Level of Consciousness: drowsy  Airway & Oxygen Therapy: Patient Spontanous Breathing and Patient connected to nasal cannula oxygen  Post-op Assessment: Report given to RN and Post -op Vital signs reviewed and stable  Post vital signs: Reviewed and stable  Last Vitals:  Vitals Value Taken Time  BP 136/79 03/15/21 1043  Temp    Pulse 87 03/15/21 1045  Resp 15 03/15/21 1045  SpO2 100 % 03/15/21 1045  Vitals shown include unvalidated device data.  Last Pain:  Vitals:   03/15/21 7782  TempSrc: Oral         Complications: No notable events documented.

## 2021-03-15 NOTE — Interval H&P Note (Signed)
History and Physical Interval Note:  03/15/2021 7:03 AM  Sally Smith  has presented today for surgery, with the diagnosis of Open wound of chest wall, unspecified laterality, initial encounter.  The various methods of treatment have been discussed with the patient and family. After consideration of risks, benefits and other options for treatment, the patient has consented to  Procedure(s) with comments: Latissimus muscle flap for chest wall reconstruction (N/A) - 4 hours CHEST WALL tumor  EXCISION including ribs (N/A) as a surgical intervention.  The patient's history has been reviewed, patient examined, no change in status, stable for surgery.  I have reviewed the patient's chart and labs.  Questions were answered to the patient's satisfaction.     Loel Lofty Mister Krahenbuhl

## 2021-03-15 NOTE — Anesthesia Procedure Notes (Signed)
Procedure Name: Intubation Date/Time: 03/15/2021 7:55 AM Performed by: Colin Benton, CRNA Pre-anesthesia Checklist: Patient identified, Emergency Drugs available, Suction available and Patient being monitored Patient Re-evaluated:Patient Re-evaluated prior to induction Oxygen Delivery Method: Circle system utilized Preoxygenation: Pre-oxygenation with 100% oxygen Induction Type: IV induction Ventilation: Mask ventilation without difficulty Laryngoscope Size: Miller and 2 Grade View: Grade I Tube type: Oral Tube size: 7.0 mm Number of attempts: 1 Airway Equipment and Method: Stylet Placement Confirmation: ETT inserted through vocal cords under direct vision, positive ETCO2 and breath sounds checked- equal and bilateral Secured at: 22 cm Tube secured with: Tape Dental Injury: Teeth and Oropharynx as per pre-operative assessment

## 2021-03-15 NOTE — Anesthesia Procedure Notes (Signed)
Arterial Line Insertion Start/End1/25/2023 8:08 AM, 03/15/2021 8:11 AM Performed by: Colin Benton, CRNA, CRNA  Patient location: OR. Preanesthetic checklist: patient identified, IV checked, site marked, risks and benefits discussed, surgical consent, monitors and equipment checked, pre-op evaluation, timeout performed and anesthesia consent Left, radial was placed Catheter size: 20 G Hand hygiene performed , maximum sterile barriers used  and Seldinger technique used Allen's test indicative of satisfactory collateral circulation Attempts: 1 Procedure performed without using ultrasound guided technique. Following insertion, dressing applied and Biopatch. Post procedure assessment: normal and unchanged  Patient tolerated the procedure well with no immediate complications.

## 2021-03-15 NOTE — Progress Notes (Signed)
[address]       [place] [address]             [phone]                                                   Ryelle Twiford [facility] [place] Medical Record #[id number] Date of Birth: 02-Sep-1960   Referring: Wallace Going, DO Primary Care: Clinton Quant, MD Primary Cardiologist: None   Chief Complaint:        Chief Complaint  Patient presents with   chest wall wound      Surgical consult, Chest CT 05/05/20   No new issues since last clinic appointment Vitals:   03/15/21 0638  BP: 108/69  Pulse: (!) 103  Resp: 17  Temp: (!) 97.4 F (36.3 C)  SpO2: 99%   Alert NAD Sinus tach EWOB  OR today for chest wall resection, and muscle flap with Dr. Marla Roe.  Per my last clinic note   History of Present Illness:    Sally Smith 61 y.o. female referred by Dr. Marla Roe in for a left chest wall postradiation wound.  She does have a history of breast cancer and over the last several years has had a chronic wound.  She did undergo biopsy of this which was negative.  She comes in today to discuss surgical planning for chest wall resection in combination with plastic surgery.             Past Medical History:  Diagnosis Date   Allergy      seasonal allergies   Anxiety      on meds   Breast cancer ([place]) 07/03/2016   Cancer of left breast ([place])      [facility] genetic testing negative on 0/24/09   Complication of anesthesia      Vagals easily to pain   Diverticulitis     Genetic testing 06/18/2016    Ms. Galindez underwent genetic counseling and testing for hereditary cancer syndromes on 05/31/2016. Her results were negative for mutations in all 46 genes analyzed by [facility]'s 46-gene Common Hereditary Cancers Panel. Genes analyzed include: APC, ATM, AXIN2, BARD1, BMPR1A, BRCA1, BRCA2, BRIP1, CDH1, CDKN2A, CHEK2, CTNNA1, DICER1, EPCAM, GREM1, HOXB13, KIT, MEN1, MLH1, MSH2, MSH3, MSH6, MUTYH, NBN,   Glaucoma      on meds    Hyperlipidemia      on meds   Personal history of radiation therapy 2018    Left Breast Cancer   Skin cancer      left breast            Past Surgical History:  Procedure Laterality Date   BREAST EXCISIONAL BIOPSY Left 04/2020   BREAST LUMPECTOMY Left 07/03/2016   BREAST LUMPECTOMY WITH RADIOACTIVE SEED AND SENTINEL LYMPH NODE BIOPSY Left 07/04/2016    Procedure: LEFT BREAST LUMPECTOMY WITH RADIOACTIVE SEED X 2 AND SENTINEL LYMPH NODE BIOPSY;  Surgeon: Stark Klein, MD;  Location: [facility];  Service: General;  Laterality: Left;   EYE SURGERY Bilateral 11/25/2020    cataract surgery   SKIN BIOPSY Left 05/17/2020    Procedure: INCISIONAL BIOPSY LEFT  CHEST WALL MASS;  Surgeon: Stark Klein, MD;  Location: [facility];  Service: General;  Laterality: Left;   surgery on right leg  TONSILLECTOMY       WISDOM TOOTH EXTRACTION   1985           Family History  Problem Relation Age of Onset   Breast cancer Mother 52        Contralateral breast cancer a few years later that metastasized.   Colon cancer Neg Hx     Ovarian cancer Neg Hx     Endometrial cancer Neg Hx     Pancreatic cancer Neg Hx     Prostate cancer Neg Hx          Social History       Tobacco Use  Smoking Status Never  Smokeless Tobacco Never    Social History        Substance and Sexual Activity  Alcohol Use Yes    Comment: rare             Allergies  Allergen Reactions   Bee Venom Anaphylaxis   Elemental Sulfur Hives      itching   Erythromycin Base     Latex Itching      welps   Sulfa Antibiotics Hives and Swelling   Wound Dressing Adhesive     Contrast Media [Iodinated Diagnostic Agents] Rash            Current Outpatient Medications  Medication Sig Dispense Refill   ALPRAZolam (XANAX) 0.25 MG tablet Take 0.25 mg by mouth at bedtime as needed for anxiety.       aspirin EC 81 MG tablet Take 81 mg by mouth daily. Swallow whole.       atorvastatin  (LIPITOR) 20 MG tablet Take 20 mg by mouth daily.       buPROPion (WELLBUTRIN XL) 150 MG 24 hr tablet TAKE 1 TABLET BY MOUTH EVERY DAY. 30 tablet 0   Calcium Carb-Cholecalciferol (CALCIUM-VITAMIN D) 500-200 MG-UNIT tablet Take 1 tablet by mouth daily.        cholecalciferol (VITAMIN D) 1000 units tablet Take 1,000 Units by mouth daily.       Docusate Sodium (STOOL SOFTENER LAXATIVE PO) Take 1 tablet by mouth daily at 6 (six) AM.       EPINEPHrine 0.3 mg/0.3 mL IJ SOAJ injection Inject into the muscle as needed.       hyaluronate sodium (RADIAPLEXRX) GEL APPLY TOPICALLY ONCE AS NEEDED FOR UP TO 1 DOSE 170 g 0   latanoprost (XALATAN) 0.005 % ophthalmic solution Place 1 drop into both eyes at bedtime.       letrozole (FEMARA) 2.5 MG tablet TAKE 1 TABLET BY MOUTH ONCE A DAY. 90 tablet 3   LORazepam (ATIVAN) 1 MG tablet Take 1 mg by mouth 3 (three) times daily.       ondansetron (ZOFRAN-ODT) 4 MG disintegrating tablet Take 1 tablet (4 mg total) by mouth every 8 (eight) hours as needed. for nausea 30 tablet 0   potassium chloride (KLOR-CON) 20 MEQ packet Take 20 mEq by mouth daily. 20 packet 0   PROCTO-MED HC 2.5 % rectal cream Apply topically daily as needed.       Saccharomyces boulardii (DIGESTIVE PROBIOTIC PO) Take 1 capsule by mouth daily at 6 (six) AM.       traMADol (ULTRAM) 50 MG tablet Take 1 tablet (50 mg total) by mouth every 12 (twelve) hours as needed. 20 tablet 1   zoledronic acid (RECLAST) 5 MG/100ML SOLN injection Inject 5 mg into the vein. EVERY 6MOS (due in May/Nov)       zolpidem (  AMBIEN) 10 MG tablet Take 5 mg by mouth at bedtime as needed for sleep.       acetaminophen (TYLENOL) 500 MG tablet Take 1,000 mg by mouth every 6 (six) hours as needed for mild pain, moderate pain, fever or headache. (Patient not taking: Reported on 01/27/2021)       ibuprofen (ADVIL,MOTRIN) 200 MG tablet Take 400 mg by mouth every 6 (six) hours as needed for fever, headache, mild pain, moderate pain or  cramping. (Patient not taking: Reported on 01/27/2021)        No current facility-administered medications for this visit.      Review of Systems  Constitutional:  Negative for malaise/fatigue.  Cardiovascular:  Positive for chest pain.  Musculoskeletal:  Positive for myalgias.  Neurological: Negative.     PHYSICAL EXAMINATION: BP 110/64 (BP Location: Right Arm, Patient Position: Sitting)    Pulse 79    Resp 20    Ht _0  (1.6 m)    Wt 100 lb 8 oz (45.6 kg)    LMP 11/29/2006 (Exact Date)    SpO2 100% Comment: RA   BMI 17.80 kg/m    Physical Exam Constitutional:      General: She is not in acute distress.    Appearance: Normal appearance. She is not ill-appearing.  HENT:     Head: Normocephalic and atraumatic.  Eyes:     Extraocular Movements: Extraocular movements intact.  Cardiovascular:     Rate and Rhythm: Normal rate.  Chest:    Musculoskeletal:     Cervical back: Normal range of motion.  Neurological:     Mental Status: She is alert.      Diagnostic Studies & Laboratory data:     Recent Radiology Findings:    Imaging Results  DG Bone Density   Result Date: 01/02/2021 EXAM: DUAL X-RAY ABSORPTIOMETRY (DXA) FOR BONE MINERAL DENSITY IMPRESSION: Referring Physician:  [name] Your patient completed a bone mineral density test using GE Lunar iDXA system (analysis version: 16). Technologist: [employer] PATIENT: Name: Sally, Smith Patient ID: [id number] Birth Date: September 07, 1960 Height: 62.0 in. Sex: Female Measured: 01/02/2021 Weight: 100.6 lbs. Indications: Breast Cancer History, Caucasian, Estrogen Deficient, Letrozole, Postmenopausal Fractures: NONE Treatments: Calcium (E943.0), Hormone Therapy For Cancer, Reclast, Vitamin D (E933.5) ASSESSMENT: The BMD measured at Femur Neck Right is 0.744 g/cm2 with a T-score of -2.1. This patient is considered osteopenic/low bone mass according to [facility]) criteria. The quality of the exam is good. Patient does not meet  criteria for FRAX due to current use of Reclast. Site Region Measured Date Measured Age YA BMD Significant CHANGE T-score AP Spine  L1-L4      1/02/20[age].0         -1.5    1.001 g/cm2 DualFemur Neck Right 01/02/2021    60.0         -2.1    0.744 g/cm2 DualFemur Neck Right 12/04/2018 59.9 -2.1 0.741 g/cm2 * DualFemur Total Mean 01/02/2021    62.0         -1.6    0.802 g/cm2 DualFemur Total Mean 12/04/2018 59.9 -1.6 0.801 g/cm2 * World Health Organization [facility]) criteria for post-menopausal, Caucasian Women: Normal       T-score at or above -1 SD Osteopenia   T-score between -1 and -2.5 SD Osteoporosis T-score at or below -2.5 SD RECOMMENDATION: 1. All patients should optimize calcium and vitamin D intake. 2. Consider FDA-approved medical therapies in postmenopausal women and men  aged 81 years and older, based on the following: a. A hip or vertebral (clinical or morphometric) fracture. b. T-score = -2.5 at the femoral neck or spine after appropriate evaluation to exclude secondary causes. c. Low bone mass (T-score between -1.0 and -2.5 at the femoral neck or spine) and a 10-year probability of a hip fracture = 3% or a 10-year probability of a major osteoporosis-related fracture = 20% based on the [place]-adapted WHO algorithm. d. Clinician judgment and/or patient preferences may indicate treatment for people with 10-year fracture probabilities above or below these levels. FOLLOW-UP: Patients with diagnosis of osteoporosis or at high risk for fracture should have regular bone mineral density tests.? Patients eligible for Medicare are allowed routine testing every 2 years.? The testing frequency can be increased to one year for patients who have rapidly progressing disease, are receiving or discontinuing medical therapy to restore bone mass, or have additional risk factors. I have reviewed this study and agree with the findings. [facility] Electronically Signed   By: Elmer Picker M.D.   On:  01/02/2021 11:34           I have independently reviewed the above radiology studies  and reviewed the findings with the patient.    Recent Lab Findings: Recent Labs       Lab Results  Component Value Date    WBC 5.1 01/17/2021    HGB 12.0 01/17/2021    HCT 34.5 (L) 01/17/2021    PLT 206 01/17/2021    GLUCOSE 84 01/17/2021    ALT 25 01/17/2021    AST 25 01/17/2021    NA 139 01/17/2021    K 3.3 (L) 01/17/2021    CL 105 01/17/2021    CREATININE 0.68 01/17/2021    BUN 9 01/17/2021    CO2 28 01/17/2021                Assessment / Plan:   60 year old female with history of left breast cancer that was treated with partial mastectomy and radiation.  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  Biopsies have been negative for recurrence.  Due to the chronicity of this she will require a resection along with tissue advancement flap performed by plastic surgery.  I am available to assist with the chest wall resection.  I personally reviewed the CT scan from March 2022.  She does have a chronic tract that does appear to involve the rib.  The sternum appears uninvolved.  I would like to obtain updated cross-sectional imaging prior to surgery.  This has been ordered.  We will coordinate timing to perform this procedure.

## 2021-03-15 NOTE — Discharge Instructions (Signed)
INSTRUCTIONS FOR AFTER BREAST SURGERY   You will likely have some questions about what to expect following your operation.  The following information will help you and your family understand what to expect when you are discharged from the hospital.  Following these guidelines will help ensure a smooth recovery and reduce risks of complications.  Postoperative instructions include information on: diet, wound care, medications and physical activity.  AFTER SURGERY Expect to go home after the procedure.  In some cases, you may need to spend one night in the hospital for observation.  DIET Breast surgery does not require a specific diet.  However, the healthier you eat the better your body can start healing. It is important to increasing your protein intake.  This means limiting the foods with sugar and carbohydrates.  Focus on vegetables and some meat.  If you have any liposuction during your procedure be sure to drink water.  If your urine is bright yellow, then it is concentrated, and you need to drink more water.  As a general rule after surgery, you should have 8 ounces of water every hour while awake.  If you find you are persistently nauseated or unable to take in liquids let us know.  NO TOBACCO USE or EXPOSURE.  This will slow your healing process and increase the risk of a wound.  WOUND CARE Leave the ACE wrap or binder on for 3 days . Use fragrance free soap.   After 3 days you can remove the ACE wrap or binder to shower. Once dry apply ACE wrap, binder or sports bra.  Use a mild soap like Dial, Dove and [place]. You may have Topifoam or Lipofoam on.  It is soft and spongy and helps keep you from getting creases if you have liposuction.  This can be removed before the shower and then replaced.  If you need more it is available on Amazon (Lipofoam). If you have steri-strips / tape directly attached to your skin leave them in place. It is OK to get these wet.   No baths, pools or hot tubs for four  weeks. We close your incision to leave the smallest and best-looking scar. No ointment or creams on your incisions until given the go ahead.  Especially not Neosporin (Too many skin reactions with this one).  A few weeks after surgery you can use Mederma and start massaging the scar. We ask you to wear your binder or sports bra for the first 6 weeks around the clock, including while sleeping. This provides added comfort and helps reduce the fluid accumulation at the surgery site.  ACTIVITY No heavy lifting until cleared by the doctor.  This usually means no more than a half-gallon of milk.  It is OK to walk and climb stairs. In fact, moving your legs is very important to decrease your risk of a blood clot.  It will also help keep you from getting deconditioned.  Every 1 to 2 hours get up and walk for 5 minutes. This will help with a quicker recovery back to normal.  Let pain be your guide so you don't do too much.  This is not the time for spring cleaning and don't plan on taking care of anyone else.  This time is for you to recover,  You will be more comfortable if you sleep and rest with your head elevated either with a few pillows under you or in a recliner.  No stomach sleeping for a three months.  WORK Everyone  returns to work at different times. As a rough guide, most people take at least 1 - 2 weeks off prior to returning to work. If you need documentation for your job, bring the forms to your postoperative follow up visit.  DRIVING Arrange for someone to bring you home from the hospital.  You may be able to drive a few days after surgery but not while taking any narcotics or valium.  BOWEL MOVEMENTS Constipation can occur after anesthesia and while taking pain medication.  It is important to stay ahead for your comfort.  We recommend taking Milk of Magnesia (2 tablespoons; twice a day) while taking the pain pills.  MEDICATIONS You may be prescribed should start after surgery At your  preoperative visit for you history and physical you may have been given the following medications: An antibiotic: Start this medication when you get home and take according to the instructions on the bottle. Zofran 4 mg:  This is to treat nausea and vomiting.  You can take this every 6 hours as needed and only if needed. Valium 2 mg: This is for muscle tightness if you have an implant or expander. This will help relax your muscle which also helps with pain control.  This can be taken every 12 hours as needed. Don't drive after taking this medication. Norco (hydrocodone/acetaminophen) 5/325 mg:  This is only to be used after you have taken the motrin or the tylenol. Every 8 hours as needed.   Over the counter Medication to take: Ibuprofen (Motrin) 600 mg:  Take this every 6 hours.  If you have additional pain then take 500 mg of the tylenol every 8 hours.  Only take the Norco after you have tried these two. Miralax or stool softener of choice: Take this according to the bottle if you take the Norco.  WHEN TO CALL Call your surgeon's office if any of the following occur: Fever 101 degrees F or greater Excessive bleeding or fluid from the incision site. Pain that increases over time without aid from the medications Redness, warmth, or pus draining from incision sites Persistent nausea or inability to take in liquids Severe misshapen area that underwent the operation. 

## 2021-03-15 NOTE — Anesthesia Postprocedure Evaluation (Signed)
Anesthesia Post Note  Patient: Sally Smith  Procedure(s) Performed: Latissimus muscle flap for chest wall reconstruction (Chest) CHEST WALL TUMOR EXCISION (Left: Chest)     Patient location during evaluation: PACU Anesthesia Type: General Level of consciousness: awake Pain management: pain level controlled Vital Signs Assessment: post-procedure vital signs reviewed and stable Cardiovascular status: stable Postop Assessment: no apparent nausea or vomiting Anesthetic complications: no   No notable events documented.  Last Vitals:  Vitals:   03/15/21 1115 03/15/21 1130  BP: 135/83 135/72  Pulse: 95 91  Resp: 15 (!) 26  Temp:    SpO2: 100% 100%    Last Pain:  Vitals:   03/15/21 1115  TempSrc:   PainSc: 0-No pain                 Kennisha Qin

## 2021-03-16 ENCOUNTER — Encounter (HOSPITAL_COMMUNITY): Payer: Self-pay | Admitting: Plastic Surgery

## 2021-03-16 ENCOUNTER — Telehealth: Payer: Self-pay | Admitting: *Deleted

## 2021-03-16 LAB — SURGICAL PATHOLOGY

## 2021-03-16 MED ORDER — KATE FARMS STANDARD 1.4 PO LIQD
325.0000 mL | Freq: Two times a day (BID) | ORAL | Status: DC
  Administered 2021-03-16 – 2021-03-17 (×3): 325 mL via ORAL
  Filled 2021-03-16 (×4): qty 325

## 2021-03-16 MED ORDER — ADULT MULTIVITAMIN W/MINERALS CH
1.0000 | ORAL_TABLET | Freq: Every day | ORAL | Status: DC
  Administered 2021-03-16 – 2021-03-17 (×2): 1 via ORAL
  Filled 2021-03-16 (×2): qty 1

## 2021-03-16 MED ORDER — LETROZOLE 2.5 MG PO TABS
2.5000 mg | ORAL_TABLET | Freq: Every day | ORAL | Status: DC
  Administered 2021-03-16 – 2021-03-17 (×2): 2.5 mg via ORAL
  Filled 2021-03-16 (×2): qty 1

## 2021-03-16 MED ORDER — OXYCODONE-ACETAMINOPHEN 5-325 MG PO TABS
1.0000 | ORAL_TABLET | Freq: Four times a day (QID) | ORAL | Status: DC | PRN
  Administered 2021-03-16: 1 via ORAL
  Filled 2021-03-16: qty 1

## 2021-03-16 MED ORDER — HYDROCODONE-ACETAMINOPHEN 5-325 MG PO TABS
1.0000 | ORAL_TABLET | Freq: Four times a day (QID) | ORAL | Status: DC | PRN
  Administered 2021-03-16 – 2021-03-17 (×2): 1 via ORAL
  Filled 2021-03-16 (×2): qty 1

## 2021-03-16 NOTE — Progress Notes (Signed)
Initial Nutrition Assessment  DOCUMENTATION CODES:   Non-severe (moderate) malnutrition in context of chronic illness  INTERVENTION:  -[employer] [place] Standard 1.4 BID, between meals, each supplement provides 455 kcal and 20 grams of protein  -MVI  -High calorie, high protein diet education and handout provided    NUTRITION DIAGNOSIS:   Moderate Malnutrition related to chronic illness (chronic nonhealing wound) as evidenced by mild muscle depletion, mild fat depletion.  GOAL:   Patient will meet greater than or equal to 90% of their needs   MONITOR:   PO intake, Supplement acceptance, Weight trends, Skin, I & O's  REASON FOR ASSESSMENT:   Malnutrition Screening Tool    ASSESSMENT:    Pt is a 61 y.o. female with a history of left breast cancer s/p partial mastectomy and XRT. Pt presented for surgery due to chronic nonhealing in the radiation field  1/25 - latissimus muscle flap for chest wall reconstruction   Visited with pt at bedside. Pt's husband is in the room during visit with pt. Pt's breakfast tray was across pt in bed and pt had eaten some of her blueberry muffin and a few bits of one half of toast. Per pt, she liked the blueberry muffin. Per pt, pt enjoys cooking at home and states that the kitchen is her "happy place." Pt reports that typically for breakfast she has a boiled egg with avocado toast. She also reports that on Wednesday's she has scrambled eggs with sauteed spinach and peppers, shredded hash browns made with real potatoes, nitrate-free bacon and oatmeal with craisans and blueberries. Per pt, pt is very health-conscious and reads all food labels. When entering pt's room, pt was reading the food label on the butter as pt states that she eats plant-based butter spreads. Per pt, pt cooks at home and rarely eats fast-food. Pt typically has homemade baked spaghetti with chickpea pasta for dinner, or a shrimp pasta made with white wine and red peppers.   Discussed with  pt the importance of good nutrition and po intake to promote healing and for a healthful diet. Provided pt with handout from the Academy of Nutrition and Dietetics, "High-Calorie, High-Protein Nutrition Therapy," Encouraged pt to consume high-calorie foods and protein foods to promote post-op healing. Discussed with pt plant-based protein shakes offered here in the hospital and pt agreeable to trying [facility] vanilla oral supplement shake.   Per weight encounters, pt has maintained a stable weight. Per pt, pt has lost 35# since her cancer dx four years ago. Noted that pt felt uncomfortable at the weight that she was at after her cancer treatments. Unclear if this weight loss was intentional, unintentional or a mix of both.   Medications reviewed and include: senokot, D5 1/2 NS with Kcl @ 100 ml/hr  Labs: reviewed   JP Drain 1: 161 ml x 24 hrs JP Drain 2: 113 ml x 24 hrs    NUTRITION - FOCUSED PHYSICAL EXAM:  Flowsheet Row Most Recent Value  Orbital Region Mild depletion  Upper Arm Region Mild depletion  Thoracic and Lumbar Region Mild depletion  Buccal Region Mild depletion  Temple Region Mild depletion  Clavicle Bone Region Mild depletion  Clavicle and Acromion Bone Region Mild depletion  Scapular Bone Region Moderate depletion  Dorsal Hand Mild depletion  Patellar Region Mild depletion  Anterior Thigh Region Mild depletion  Posterior Calf Region Mild depletion  Edema (RD Assessment) None  Hair Reviewed  Eyes Reviewed  Mouth Reviewed  Skin Reviewed  Nails Reviewed  Diet Order:   Diet Order             Diet regular Room service appropriate? Yes; Fluid consistency: Thin  Diet effective now                   EDUCATION NEEDS:   Education needs have been addressed  Skin:  Skin Assessment: Skin Integrity Issues: Skin Integrity Issues:: Incisions Incisions: left chest, left flank  Last BM:  unknown  Height:   Ht Readings from Last 1 Encounters:   03/15/21 5' 2.5" (1.588 m)    Weight:   Wt Readings from Last 1 Encounters:  03/15/21 44 kg    Ideal Body Weight:  51.1 kg  BMI:  Body mass index is 17.48 kg/m.  Estimated Nutritional Needs:   Kcal:  1450 - 1650  Protein:  70 - 85 grams  Fluid:  > 1.4 L    Maryruth Hancock, Dietetic Intern 03/16/2021 3:29 PM

## 2021-03-16 NOTE — Telephone Encounter (Signed)
Received on (03/07/2021) via of fax from [facility] Standard Written Order.  Requesting signature and return.  Given to provider to sign.  DME Standard Written Order signed and faxed back to [facility].  Confirmation received and copy scanned into the chart.//AB/CMA

## 2021-03-16 NOTE — Plan of Care (Signed)
  Problem: Education: Goal: Knowledge of General Education information will improve Description: Including pain rating scale, medication(s)/side effects and non-pharmacologic comfort measures Outcome: Progressing   Problem: Pain Managment: Goal: General experience of comfort will improve Outcome: Progressing   Problem: Safety: Goal: Ability to remain free from injury will improve Outcome: Progressing   

## 2021-03-16 NOTE — Progress Notes (Signed)
1 Day Post-Op  Subjective: Patient resting in bed, no acute overnight events.  Husband at bedside.  She reports ongoing pain and tenderness to left breast and left back.  She is also had some nausea, but this is well controlled with Zofran.  She did require some IV pain medications.  She would like to stay an additional night for pain control.  Objective: Vital signs in last 24 hours: Temp:  [96.6 F (35.9 C)-98.3 F (36.8 C)] 98.3 F (36.8 C) (01/26 0812) Pulse Rate:  [84-97] 88 (01/26 0812) Resp:  [15-26] 17 (01/26 0812) BP: (97-145)/(48-83) 97/48 (01/26 0812) SpO2:  [9 %-100 %] 100 % (01/26 0812) FiO2 (%):  [21 %] 21 % (01/25 1158)    Intake/Output from previous day: 01/25 0701 - 01/26 0700 In: 2085.6 [I.V.:1708.7; IV Piggyback:376.9] Out: 0947 [Urine:1150; Drains:274; Blood:25] Intake/Output this shift: Total I/O In: 120 [P.O.:120] Out: 1000 [Urine:1000]  General appearance: alert, cooperative, no distress, and resting in bed, husband at bedside Head: Normocephalic, without obvious abnormality, atraumatic Back: Dressing over left back incision, no drainage noted.  No erythema or cellulitic changes.  Mild tenderness to palpation.  No subcutaneous fluid collections or hematoma noted with palpation.  JP drain in place with serosanguineous drainage in bulb. Resp: Unlabored, symmetric rise and fall Breasts: Left breast with radiation tattoo marks noted, latissimus skin paddle in place with good color and capillary refill.  There is no surrounding erythema or cellulitic changes.  No subcutaneous fluid collection noted palpation.  Skin paddle does not appear dusky in color.  Left breast incisions intact.  JP drain in place with serosanguineous drainage in bulb. Extremities: extremities normal, atraumatic, no cyanosis or edema  Lab Results:  CBC Latest Ref Rng & Units 03/13/2021 01/17/2021 07/13/2020  WBC 4.0 - 10.5 K/uL 5.1 5.1 6.0  Hemoglobin 12.0 - 15.0 g/dL 13.1 12.0 13.7   Hematocrit 36.0 - 46.0 % 36.4 34.5(L) 38.7  Platelets 150 - 400 K/uL 168 206 180    BMET Recent Labs    03/13/21 1428  NA 137  K 4.0  CL 106  CO2 23  GLUCOSE 102*  BUN 8  CREATININE 0.69  CALCIUM 9.5   PT/INR Recent Labs    03/13/21 1428  LABPROT 13.5  INR 1.0   ABG Recent Labs    03/13/21 1428  PHART 7.432  HCO3 23.5    Studies/Results: No results found.  Anti-infectives: Anti-infectives (From admission, onward)    Start     Dose/Rate Route Frequency Ordered Stop   03/15/21 1600  ceFAZolin (ANCEF) IVPB 1 g/50 mL premix        1 g 100 mL/hr over 30 Minutes Intravenous Every 8 hours 03/15/21 1256 03/22/21 1559   03/15/21 0715  ceFAZolin (ANCEF) IVPB 2g/100 mL premix        2 g 200 mL/hr over 30 Minutes Intravenous On call to O.R. 03/15/21 0962 03/15/21 0834   03/15/21 0555  ceFAZolin (ANCEF) 2-4 GM/100ML-% IVPB       Note to Pharmacy: Humberto Leep O: cabinet override      03/15/21 0555 03/15/21 0814   03/15/21 0551  ceFAZolin (ANCEF) IVPB 2g/100 mL premix  Status:  Discontinued        2 g 200 mL/hr over 30 Minutes Intravenous 30 min pre-op 03/15/21 0551 03/15/21 0711       Assessment/Plan: s/p Procedure(s): Latissimus muscle flap for chest wall reconstruction CHEST WALL TUMOR EXCISION  61 year old female status post latissimus myocutaneous muscle flap, excision  of left breast wound, soft tissue, muscle and rib yesterday 03/15/2021 with Dr. Marla Roe.  She is overall doing well, having some issues with pain control.  Pain control: Continue with p.o. pain medications, IV for breakthrough.  Discussed with patient and nursing staff that should hold off on IV pain medications unless necessary to help prepare her for discharge.  DVT prophylaxis: Out of bed, ambulating, SCDs.  FEN: Normal, protein shakes for supplement.  Possible plan for discharge tomorrow, continue to monitor JP drain output.   LOS: 1 day    Charlies Constable, PA-C 03/16/2021

## 2021-03-17 DIAGNOSIS — E440 Moderate protein-calorie malnutrition: Secondary | ICD-10-CM | POA: Insufficient documentation

## 2021-03-17 MED ORDER — METHOCARBAMOL 500 MG PO TABS: 500.0000 mg | ORAL_TABLET | Freq: Three times a day (TID) | ORAL | 0 refills | Status: DC | PRN

## 2021-03-17 NOTE — Progress Notes (Signed)
Discharge instructions provided to patient and husband. Drain training and equipment provided to patient. Ivs removed. Patient discharged

## 2021-03-20 ENCOUNTER — Other Ambulatory Visit: Payer: Self-pay | Admitting: Hematology

## 2021-03-20 NOTE — Discharge Summary (Signed)
Physician Discharge Summary  Patient ID: Sally Smith MRN: [id number] DOB/AGE: 04-04-1960 61 y.o.  Admit date: 03/15/2021 Discharge date: 03/20/2021  Admission Diagnoses:  Discharge Diagnoses:  Principal Problem:   Open wound of chest wall, left, initial encounter Active Problems:   Malnutrition of moderate degree   Discharged Condition: good  Hospital Course: 61 year old female presented to the [facility] operating room on 03/15/2021 for latissimus muscle flap to reconstruct her left chest wall, excision of left breast wound, soft tissue, muscle and rib with Dr. Marla Roe and Dr. Kipp Brood.  Patient stayed overnight for 2 nights.  She had an uncomplicated postoperative course, pain was controlled on p.o. pain medication prior to discharge.  Husband has been at bedside since surgery, she is overall doing well.  She has some occasional bouts of severe pain which p.o. narcotics have been helpful with.  Bilateral JP drains with expected output.  She feels comfortable going home  Consults: none  Significant Diagnostic Studies: None  Treatments: IV hydration, antibiotics: Ancef, and analgesia: acetaminophen, Vicodin, and Morphine  Discharge Exam: Blood pressure (!) 113/55, pulse (!) 102, temperature 98 F (36.7 C), temperature source Oral, resp. rate 18, height 5' 2.5" (1.588 m), weight 44 kg, last menstrual period 11/29/2006, SpO2 99 %. General appearance: alert, cooperative, no distress, and husband at bedside Head: Normocephalic, without obvious abnormality, atraumatic Back: Left back incision is intact, dressing in place with no drainage noted.  Mild bruising Resp: Unlabored Breasts: Left breast with latissimus skin paddle in place, good color and capillary refill noted.  Left NAC is viable.  There is no erythema or cellulitic changes noted.  Incision is intact.  No subcutaneous fluid collections noted. Extremities: extremities normal, atraumatic, no cyanosis or  edema Pulses: 2+ and symmetric   Disposition: Discharge disposition: 01-Home or Self Care       Discharge Instructions     Call MD for:  difficulty breathing, headache or visual disturbances   Complete by: As directed    Call MD for:  extreme fatigue   Complete by: As directed    Call MD for:  hives   Complete by: As directed    Call MD for:  persistant dizziness or light-headedness   Complete by: As directed    Call MD for:  persistant nausea and vomiting   Complete by: As directed    Call MD for:  redness, tenderness, or signs of infection (pain, swelling, redness, odor or green/yellow discharge around incision site)   Complete by: As directed    Call MD for:  severe uncontrolled pain   Complete by: As directed    Call MD for:  temperature >100.4   Complete by: As directed    Diet - low sodium heart healthy   Complete by: As directed    Increase activity slowly   Complete by: As directed       Allergies as of 03/17/2021       Reactions   Bee Venom Anaphylaxis   Erythromycin Base Nausea And Vomiting   Latex Itching   welps   Sulfa Antibiotics Hives, Swelling   Contrast Media [iodinated Contrast Media] Rash   Wound Dressing Adhesive Itching, Rash        Medication List     TAKE these medications    acetaminophen 500 MG tablet Commonly known as: TYLENOL Take 1,000 mg by mouth every 6 (six) hours as needed for mild pain, moderate pain, fever or headache.   ALPRAZolam 0.25 MG tablet Commonly known  as: XANAX Take 0.25 mg by mouth at bedtime as needed for anxiety.   aspirin EC 81 MG tablet Take 81 mg by mouth daily. Swallow whole.   atorvastatin 20 MG tablet Commonly known as: LIPITOR Take 20 mg by mouth daily.   buPROPion 150 MG 24 hr tablet Commonly known as: WELLBUTRIN XL TAKE 1 TABLET BY MOUTH EVERY DAY.   calcium-vitamin D 500-200 MG-UNIT tablet Take 1 tablet by mouth daily.   cholecalciferol 1000 units tablet Commonly known as: VITAMIN  D Take 1,000 Units by mouth daily.   DIGESTIVE PROBIOTIC PO Take 1 capsule by mouth daily.   docusate sodium 100 MG capsule Commonly known as: COLACE Take 100 mg by mouth daily.   EPINEPHrine 0.3 mg/0.3 mL Soaj injection Commonly known as: EPI-PEN Inject 0.3 mg into the muscle as needed for anaphylaxis.   hyaluronate sodium Gel APPLY TOPICALLY ONCE AS NEEDED FOR UP TO 1 DOSE What changed: See the new instructions.   ibuprofen 200 MG tablet Commonly known as: ADVIL Take 400 mg by mouth every 6 (six) hours as needed for fever, headache, mild pain, moderate pain or cramping.   latanoprost 0.005 % ophthalmic solution Commonly known as: XALATAN Place 1 drop into both eyes at bedtime.   letrozole 2.5 MG tablet Commonly known as: FEMARA TAKE 1 TABLET BY MOUTH ONCE A DAY.   loratadine 5 MG chewable tablet Commonly known as: CLARITIN Chew 5 mg by mouth daily.   LORazepam 1 MG tablet Commonly known as: ATIVAN Take 1 mg by mouth 3 (three) times daily as needed for anxiety.   methocarbamol 500 MG tablet Commonly known as: Robaxin Take 1 tablet (500 mg total) by mouth every 8 (eight) hours as needed for muscle spasms.   ondansetron 4 MG tablet Commonly known as: Zofran Take 1 tablet (4 mg total) by mouth every 8 (eight) hours as needed for nausea or vomiting.   potassium chloride 20 MEQ packet Commonly known as: KLOR-CON Take 20 mEq by mouth daily.   Procto-Med HC 2.5 % rectal cream Generic drug: hydrocortisone Place 1 application rectally daily as needed for hemorrhoids.   STRESS FORMULA PO Take 1 tablet by mouth daily as needed (stress).   timolol 0.5 % ophthalmic solution Commonly known as: TIMOPTIC Place 1 drop into both eyes daily.   traMADol 50 MG tablet Commonly known as: ULTRAM Take 1 tablet (50 mg total) by mouth every 12 (twelve) hours as needed.   zoledronic acid 5 MG/100ML Soln injection Commonly known as: RECLAST Inject 5 mg into the vein every 6  (six) months. EVERY 6MOS (due in May/Nov)   zolpidem 5 MG tablet Commonly known as: AMBIEN Take 5 mg by mouth at bedtime as needed for sleep.        Follow-up Information     Dillingham, Loel Lofty, DO Follow up in 10 day(s).   Specialty: Plastic Surgery Contact information: [address] [place] [address] [facility] [address] [place], [place] [address] [phone]  Signed: Carola Rhine Jacora Hopkins 03/20/2021, 8:35 AM

## 2021-03-21 ENCOUNTER — Other Ambulatory Visit: Payer: Self-pay | Admitting: Hematology

## 2021-03-21 DIAGNOSIS — C50212 Malignant neoplasm of upper-inner quadrant of left female breast: Secondary | ICD-10-CM

## 2021-03-24 ENCOUNTER — Encounter: Payer: Self-pay | Admitting: Plastic Surgery

## 2021-03-24 ENCOUNTER — Other Ambulatory Visit: Payer: Self-pay

## 2021-03-24 ENCOUNTER — Ambulatory Visit (INDEPENDENT_AMBULATORY_CARE_PROVIDER_SITE_OTHER): Payer: BC Managed Care – PPO | Admitting: Plastic Surgery

## 2021-03-24 DIAGNOSIS — S21109A Unspecified open wound of unspecified front wall of thorax without penetration into thoracic cavity, initial encounter: Secondary | ICD-10-CM

## 2021-03-24 MED ORDER — HYDROCODONE-ACETAMINOPHEN 5-325 MG PO TABS: 1.0000 | ORAL_TABLET | Freq: Two times a day (BID) | ORAL | 0 refills | Status: AC | PRN

## 2021-03-24 NOTE — Progress Notes (Signed)
The patient is a 61 year old female here with her husband for follow-up after a latissimus muscle flap from her left chest wall reconstruction.  I was able to remove the drain that was in the left breast.  We should be able to remove the posterior drain in the next week or 2.  She has significant bruising as expected but no seroma or hematoma in either location.  Overall she is doing really well I am so happy for her.  She has been using the Norco about twice a day and is out I will give her 1 more refill.  We will plan to see her in the next week.

## 2021-03-27 ENCOUNTER — Telehealth: Payer: Self-pay

## 2021-03-27 NOTE — Telephone Encounter (Signed)
Patient called she is requesting a rx refill for Robaxin to be sent to [facility] in [place] call [phone]

## 2021-03-27 NOTE — Telephone Encounter (Signed)
Patient called she is also requesting a rx refill for Zofran.

## 2021-03-28 MED ORDER — ONDANSETRON HCL 4 MG PO TABS: 4.0000 mg | ORAL_TABLET | Freq: Three times a day (TID) | ORAL | 0 refills | Status: DC | PRN

## 2021-03-28 MED ORDER — METHOCARBAMOL 500 MG PO TABS: 500.0000 mg | ORAL_TABLET | Freq: Three times a day (TID) | ORAL | 0 refills | Status: DC | PRN

## 2021-03-28 NOTE — Addendum Note (Signed)
Addended byRoetta Sessions on: 03/28/2021 09:57 AM   Modules accepted: Orders

## 2021-03-31 ENCOUNTER — Encounter: Payer: Self-pay | Admitting: Plastic Surgery

## 2021-03-31 ENCOUNTER — Ambulatory Visit (INDEPENDENT_AMBULATORY_CARE_PROVIDER_SITE_OTHER): Payer: BC Managed Care – PPO | Admitting: Plastic Surgery

## 2021-03-31 ENCOUNTER — Other Ambulatory Visit: Payer: Self-pay

## 2021-03-31 DIAGNOSIS — S21109A Unspecified open wound of unspecified front wall of thorax without penetration into thoracic cavity, initial encounter: Secondary | ICD-10-CM

## 2021-03-31 NOTE — Progress Notes (Signed)
The patient is a 61 year old female here for follow-up after undergoing a left latissimus muscle flap for chest wall reconstruction March 15, 2021.  I was able to remove the VAC drain as the output was minimal.  She has significant swelling and bruising as expected for her skin coloring.  It does seem to be improving.  Overall she feels like she is doing much better this week.  Her pain is much better controlled.  There is no sign of seroma or hematoma.  No sign of infection.  I would like to see her back in 10 to 12 days.

## 2021-04-06 ENCOUNTER — Telehealth: Payer: Self-pay

## 2021-04-06 NOTE — Telephone Encounter (Signed)
Patient called to say Dr. Marla Roe asked her to let us know when she was running low on the Robaxin, which is a muscle relaxer that has helped her with the back and front pain.  Patient said she is out and would like for [place] to please refill it at the [facility] in [place], [place].  Patient said she would like to thank [place] for being so nice.  Please call.

## 2021-04-07 MED ORDER — METHOCARBAMOL 500 MG PO TABS
500.0000 mg | ORAL_TABLET | Freq: Three times a day (TID) | ORAL | 0 refills | Status: DC | PRN
Start: 2021-04-07 — End: 2021-06-19

## 2021-04-07 NOTE — Telephone Encounter (Signed)
Called and spoke with the patient and informed her that I spoke with Dr. Marla Roe, and she approved the request for the refill on the Robaxin.    Informed the patient that I refilled the Robaxin, and she can check with the pharmacy to see when she can pick it up.  Patient verbalized understanding and agreed.  Called the pharmacy to make sure the prescription was received.  Was informed the prescription was received.//AB/CMA

## 2021-04-07 NOTE — Telephone Encounter (Signed)
Called and spoke with the patient on (04/06/21) regarding the message below.  Informed the patient that Dr. Marla Roe was not in the office for me to address her request for a refill on the Robaxin.    Informed the patient that I will have to speak with Dr. Marla Roe on Friday regarding her request.  Patient verbalized understanding and agreed.//AB/CMA

## 2021-04-14 ENCOUNTER — Other Ambulatory Visit: Payer: Self-pay

## 2021-04-14 ENCOUNTER — Ambulatory Visit (INDEPENDENT_AMBULATORY_CARE_PROVIDER_SITE_OTHER): Payer: BC Managed Care – PPO | Admitting: Plastic Surgery

## 2021-04-14 ENCOUNTER — Encounter: Payer: Self-pay | Admitting: Plastic Surgery

## 2021-04-14 DIAGNOSIS — S21109A Unspecified open wound of unspecified front wall of thorax without penetration into thoracic cavity, initial encounter: Secondary | ICD-10-CM

## 2021-04-14 MED ORDER — METHOCARBAMOL 500 MG PO TABS: 500.0000 mg | ORAL_TABLET | Freq: Two times a day (BID) | ORAL | 0 refills | Status: DC | PRN

## 2021-04-14 NOTE — Progress Notes (Signed)
° °  Subjective:    Patient ID: Sally Smith, female    DOB: 06-Jun-1960, 61 y.o.   MRN: [id number]  The patient is a 61 year old female here for follow-up on her left latissimus muscle flap for chest wall wound.  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  She had this done in January.  She is doing incredibly well.  The swelling has resolved but there is still bruising.  She is getting more active and able to sleep in the bed now.  She is extremely happy with her results.    Review of Systems  Constitutional: Negative.   Eyes: Negative.   Respiratory: Negative.    Cardiovascular: Negative.   Gastrointestinal: Negative.   Endocrine: Negative.       Objective:   Physical Exam Constitutional:      Appearance: Normal appearance.  Cardiovascular:     Rate and Rhythm: Normal rate.     Pulses: Normal pulses.  Skin:    Capillary Refill: Capillary refill takes less than 2 seconds.  Neurological:     Mental Status: She is alert and oriented to person, place, and time.  Psychiatric:        Mood and Affect: Mood normal.        Behavior: Behavior normal.        Thought Content: Thought content normal.       Assessment & Plan:     ICD-10-CM   1. Open wound of chest wall, unspecified laterality, initial encounter  S21.109A       Continue with binder or sports bra whichever feels more comfortable.  Will refill Robaxin 1 more time.  Sure to do Motrin once or twice a day to help with any swelling.  Follow-up in 1 month.  Pictures were obtained of the patient and placed in the chart with the patient's or guardian's permission.

## 2021-04-17 ENCOUNTER — Other Ambulatory Visit: Payer: Self-pay | Admitting: Hematology

## 2021-04-17 DIAGNOSIS — C50212 Malignant neoplasm of upper-inner quadrant of left female breast: Secondary | ICD-10-CM

## 2021-04-17 DIAGNOSIS — Z170 Estrogen receptor positive status [ER+]: Secondary | ICD-10-CM

## 2021-04-18 ENCOUNTER — Encounter: Payer: Self-pay | Admitting: Hematology

## 2021-04-28 ENCOUNTER — Ambulatory Visit: Payer: BC Managed Care – PPO | Admitting: Plastic Surgery

## 2021-05-12 ENCOUNTER — Other Ambulatory Visit: Payer: Self-pay

## 2021-05-12 ENCOUNTER — Ambulatory Visit (INDEPENDENT_AMBULATORY_CARE_PROVIDER_SITE_OTHER): Payer: BC Managed Care – PPO | Admitting: Surgical

## 2021-05-12 DIAGNOSIS — S21109A Unspecified open wound of unspecified front wall of thorax without penetration into thoracic cavity, initial encounter: Secondary | ICD-10-CM

## 2021-05-12 NOTE — Progress Notes (Addendum)
? ?  Referring Provider ?Pomposini, Cherly Anderson, MD ?No address on file  ? ?CC: No chief complaint on file. ?   ? ?Sally Smith is an 61 y.o. female.  ?HPI: 61 year old female here for follow-up after excision of left chest wall with Dr. Kipp Brood followed by latissimus myocutaneous muscle flap reconstruction for chest wall wound with Dr. Marla Roe on 03/15/2021.  She is approximately 2 months postop. ? ?She presents today with her husband.  She reports overall she is doing well, she continues to have some tenderness of her left breast extending towards her left axilla and left back.  She reports worsening tenderness with lifting heavy objects.  She is not having any infectious symptoms.  We reviewed preop and postop photos and is very pleased with her overall outcome and has noted some improvement in the last few weeks. ? ?She is not having any infectious symptoms ? ?Review of Systems ?General: No fevers or chills ?Physical Exam ? ?  03/17/2021  ?  8:50 AM 03/17/2021  ?  4:41 AM 03/16/2021  ? 11:41 PM  ?Vitals with BMI  ?Systolic 450 388 828  ?Diastolic 55 53 51  ?Pulse 102 103 48  ?  ?General:  No acute distress,  Alert and oriented, Non-Toxic, Normal speech and affect ?Left breast myocutaneous muscle flap paddle with good color and capillary refill, bruising has resolved.  There is no erythema or cellulitic changes.  No subcutaneous fluid collection with palpation.  Left NAC is viable.  Left back latissimus myocutaneous muscle flap incision is intact and well-healed. ? ?Assessment/Plan ?61 year old female, well-healed after excision of left chest wound with CT surgery followed by left latissimus myocutaneous muscle flap with Dr. Marla Roe on 03/15/2021.  She is doing really well.  There is no signs of infection on exam.  She has a mammogram scheduled for July, discussed with patient this would be fine given she would be about 6 months postop. ? ?We discussed that physical therapy would be an option to  increase her range of motion and improve strengthening/conditioning after surgery, she is going to allow [place] more time to heal after surgery and will call if she would like a referral to PT. ? ?We discussed that no further follow-up is necessary, she is healing really well.   ? ?Recommend calling with questions or concerns.  Recommend following up as needed. ? ?Carola Rhine Luay Balding ?05/12/2021, 9:06 AM  ? ? ?    ?

## 2021-05-15 ENCOUNTER — Other Ambulatory Visit: Payer: Self-pay | Admitting: Hematology

## 2021-06-12 ENCOUNTER — Telehealth: Payer: Self-pay | Admitting: Plastic Surgery

## 2021-06-12 NOTE — Telephone Encounter (Signed)
Patient was recovering and doing light exercise and somewhere along the way she bruised the area along the top of her breast and skin graft and open of the incision. The area is very tender and darkened.  ?

## 2021-06-13 NOTE — Telephone Encounter (Signed)
Patient scheduled with Krista Blue PA-C on 06/15/21 at 2:20pm and patient instructed to use vaseline and gauze on the incision site until the appointment can be completed.  ?

## 2021-06-14 ENCOUNTER — Telehealth: Payer: Self-pay | Admitting: Hematology and Oncology

## 2021-06-14 NOTE — Telephone Encounter (Signed)
.  Called patient to schedule appointment per 4/25 inbasket, patient is aware of date and time.   ?

## 2021-06-15 ENCOUNTER — Ambulatory Visit: Payer: BC Managed Care – PPO | Admitting: Physician Assistant

## 2021-06-15 ENCOUNTER — Other Ambulatory Visit: Payer: Self-pay | Admitting: Hematology

## 2021-06-19 ENCOUNTER — Other Ambulatory Visit: Payer: Self-pay

## 2021-06-19 ENCOUNTER — Inpatient Hospital Stay: Payer: BC Managed Care – PPO | Attending: Hematology and Oncology

## 2021-06-19 ENCOUNTER — Inpatient Hospital Stay (HOSPITAL_BASED_OUTPATIENT_CLINIC_OR_DEPARTMENT_OTHER): Payer: BC Managed Care – PPO | Admitting: Hematology and Oncology

## 2021-06-19 DIAGNOSIS — Z79811 Long term (current) use of aromatase inhibitors: Secondary | ICD-10-CM | POA: Insufficient documentation

## 2021-06-19 DIAGNOSIS — M8580 Other specified disorders of bone density and structure, unspecified site: Secondary | ICD-10-CM | POA: Diagnosis not present

## 2021-06-19 DIAGNOSIS — M2560 Stiffness of unspecified joint, not elsewhere classified: Secondary | ICD-10-CM | POA: Diagnosis not present

## 2021-06-19 DIAGNOSIS — C50212 Malignant neoplasm of upper-inner quadrant of left female breast: Secondary | ICD-10-CM | POA: Insufficient documentation

## 2021-06-19 DIAGNOSIS — Z170 Estrogen receptor positive status [ER+]: Secondary | ICD-10-CM

## 2021-06-19 LAB — CBC WITH DIFFERENTIAL (CANCER CENTER ONLY)
Abs Immature Granulocytes: 0.01 10*3/uL (ref 0.00–0.07)
Basophils Absolute: 0 10*3/uL (ref 0.0–0.1)
Basophils Relative: 1 %
Eosinophils Absolute: 0.1 10*3/uL (ref 0.0–0.5)
Eosinophils Relative: 2 %
HCT: 37.8 % (ref 36.0–46.0)
Hemoglobin: 13.2 g/dL (ref 12.0–15.0)
Immature Granulocytes: 0 %
Lymphocytes Relative: 35 %
Lymphs Abs: 2 10*3/uL (ref 0.7–4.0)
MCH: 35.1 pg — ABNORMAL HIGH (ref 26.0–34.0)
MCHC: 34.9 g/dL (ref 30.0–36.0)
MCV: 100.5 fL — ABNORMAL HIGH (ref 80.0–100.0)
Monocytes Absolute: 0.5 10*3/uL (ref 0.1–1.0)
Monocytes Relative: 10 %
Neutro Abs: 2.9 10*3/uL (ref 1.7–7.7)
Neutrophils Relative %: 52 %
Platelet Count: 177 10*3/uL (ref 150–400)
RBC: 3.76 MIL/uL — ABNORMAL LOW (ref 3.87–5.11)
RDW: 11.9 % (ref 11.5–15.5)
WBC Count: 5.6 10*3/uL (ref 4.0–10.5)
nRBC: 0 % (ref 0.0–0.2)

## 2021-06-19 LAB — CMP (CANCER CENTER ONLY)
ALT: 18 U/L (ref 0–44)
AST: 24 U/L (ref 15–41)
Albumin: 4.4 g/dL (ref 3.5–5.0)
Alkaline Phosphatase: 59 U/L (ref 38–126)
Anion gap: 4 — ABNORMAL LOW (ref 5–15)
BUN: 11 mg/dL (ref 8–23)
CO2: 32 mmol/L (ref 22–32)
Calcium: 9.7 mg/dL (ref 8.9–10.3)
Chloride: 102 mmol/L (ref 98–111)
Creatinine: 0.72 mg/dL (ref 0.44–1.00)
GFR, Estimated: >60 mL/min (ref >60)
Glucose, Bld: 66 mg/dL — ABNORMAL LOW (ref 70–99)
Potassium: 3.5 mmol/L (ref 3.5–5.1)
Sodium: 138 mmol/L (ref 135–145)
Total Bilirubin: 0.3 mg/dL (ref 0.3–1.2)
Total Protein: 7.8 g/dL (ref 6.5–8.1)

## 2021-06-19 NOTE — Progress Notes (Signed)
? ?Patient Care Team: ?Smith, Sally Anderson, MD as PCP - General (Internal Medicine) ?Sally Merle, MD as Consulting Physician (Hematology) ?Sally Klein, MD as Consulting Physician (General Surgery) ?Sally Rudd, MD as Consulting Physician (Radiation Oncology) ?Sally Phlegm, NP as Nurse Practitioner (Hematology and Oncology) ? ?DIAGNOSIS:  ?Encounter Diagnosis  ?Name Primary?  ? Malignant neoplasm of upper-inner quadrant of left breast in female, estrogen receptor positive ([place])   ? ? ?SUMMARY OF ONCOLOGIC HISTORY: ?Oncology History Overview Note  ?Cancer Staging ?Malignant neoplasm of upper-inner quadrant of left breast in female, estrogen receptor positive ([place]) ?Staging form: Breast, AJCC 8th Edition ?- Clinical stage from 05/22/2016: Stage IA (cT1a, cN0, cM0, G2, ER: Positive, PR: Positive, HER2: Negative) - Signed by Sally Merle, MD on 05/29/2016 ? ? ?  ?Malignant neoplasm of upper-inner quadrant of left breast in female, estrogen receptor positive ([place])  ?05/10/2016 Mammogram  ? Bilateral screening mammogram on 05/10/16 showed a possible distortion with calcifications in the left breast. ?  ?05/16/2016 Mammogram  ? Diagnostic mammogram and US showed Persistent distortion in the upper inner quadrant of the left breast possibly correlating with the sonographic area of acoustic shadowing, measuring about 62m. UKoreaof left axilla (-) ? ?  ?05/22/2016 Receptors her2  ? ER 100%, PR 70%+, Ki67 5% ? ?  ?05/22/2016 Initial Biopsy  ? Diagnosis ?Breast, left, needle core biopsy, upper inner ?- INVASIVE AND IN SITU LOBULAR CARCINOMA, G1-2 ? ?  ?05/22/2016 Initial Diagnosis  ? Malignant neoplasm of upper-inner quadrant of left breast in female, estrogen receptor positive ([place] ? ?  ?05/31/2016 Genetic Testing  ? Genetic counseling and testing for hereditary cancer syndromes performed on 05/31/2016. Results are negative for pathogenic mutations in 46 genes analyzed by [employer]'s Common Hereditary Cancers Panel. Results are dated  06/11/2016. Genes analyzed include: APC, ATM, AXIN2, BARD1, BMPR1A, BRCA1, BRCA2, BRIP1, CDH1, CDKN2A, CHEK2, CTNNA1, DICER1, EPCAM, GREM1, HOXB13, KIT, MEN1, MLH1, MSH2, MSH3, MSH6, MUTYH, NBN, NF1, NTHL1, PALB2, PDGFRA, PMS2, POLD1, POLE, PTEN, RAD50, RAD51C, RAD51D, SDHA, SDHB, SDHC, SDHD, SMAD4, SMARCA4, STK11, TP53, TSC1, TSC2, and VHL. ? ? ?  ?07/01/2016 Oncotype testing  ? Oncotype testing showed reoccurance score of 11. With 10 year risk of distant reoccurrence 7% with tamoxifen alone ? ?  ?07/04/2016 Surgery  ? Left breast lumpectomy with radioactive seed x2 and sentinel lymph node biopsy by Dr. BBarry Smith? ?  ?08/08/2016 - 09/24/2016 Radiation Therapy  ? Patient underwent radiation with Dr.Moody ? ?  ?09/2016 -  Anti-estrogen oral therapy  ? Letrozole 2.5 mg daily starting 09/2016 ?  ?05/13/2017 Mammogram  ? Bilateral diagnostic mammogram and left ultrasonography  ?IMPRESSION:  ?1. No suspicious mammographic or sonographic abnormalities in the areas of patient's LEFT breast pain.  ?2. No mammographic evidence of breast malignancy. ? ?  ?10/14/2017 Imaging  ? 10/14/2017 Breast MRI ?IMPRESSION: ?No MRI evidence of malignancy in either breast. Left lumpectomy ?changes. No adenopathy. ? ?  ? ? ?CHIEF COMPLIANT: Follow-up on Letrozole and to establish oncology care with me ? ?INTERVAL HISTORY: PShyna Duignanis a 61 Y.o. with the above mention on Letrozole. She state that she had fatigue joint aches stiffness,and loss of hair. She states that her appetite is good. She has had some vaginal dryness. She states that she feels better than 3 weeks ago from the open wound she had.  Overall she thinks that she is tolerating letrozole reasonably well.  She is experiencing some side effects but she is made up her mind  to continue the antiestrogen therapy for the full duration of treatment. ? ? ?ALLERGIES:  is allergic to bee venom, erythromycin base, latex, sulfa antibiotics, contrast media [iodinated contrast media], and  wound dressing adhesive. ? ?MEDICATIONS:  ?Current Outpatient Medications  ?Medication Sig Dispense Refill  ? ALPRAZolam (XANAX) 0.25 MG tablet Take 0.25 mg by mouth at bedtime as needed for anxiety.    ? aspirin EC 81 MG tablet Take 81 mg by mouth daily. Swallow whole.    ? atorvastatin (LIPITOR) 20 MG tablet Take 20 mg by mouth daily.    ? buPROPion (WELLBUTRIN XL) 150 MG 24 hr tablet TAKE 1 TABLET BY MOUTH EVERY DAY. 30 tablet 0  ? Calcium Carb-Cholecalciferol (CALCIUM-VITAMIN D) 500-200 MG-UNIT tablet Take 1 tablet by mouth daily.     ? cholecalciferol (VITAMIN D) 1000 units tablet Take 1,000 Units by mouth daily.    ? docusate sodium (COLACE) 100 MG capsule Take 100 mg by mouth daily.    ? EPINEPHrine 0.3 mg/0.3 mL IJ SOAJ injection Inject 0.3 mg into the muscle as needed for anaphylaxis.    ? fluticasone (FLONASE) 50 MCG/ACT nasal spray fluticasone propionate 50 mcg/actuation nasal spray,suspension ? Spray 2 sprays every day by intranasal route as directed.    ? hyaluronate sodium (RADIAPLEXRX) GEL APPLY TOPICALLY ONCE AS NEEDED FOR UP TO 1 DOSE 170 g 0  ? ibuprofen (ADVIL,MOTRIN) 200 MG tablet Take 400 mg by mouth every 6 (six) hours as needed for fever, headache, mild pain, moderate pain or cramping.    ? latanoprost (XALATAN) 0.005 % ophthalmic solution Place 1 drop into both eyes at bedtime.    ? letrozole (FEMARA) 2.5 MG tablet TAKE 1 TABLET BY MOUTH ONCE A DAY. 30 tablet 0  ? loratadine (CLARITIN) 5 MG chewable tablet Chew 5 mg by mouth daily.    ? LORazepam (ATIVAN) 1 MG tablet Take 1 mg by mouth 3 (three) times daily as needed for anxiety.    ? Multiple Vitamin (STRESS FORMULA PO) Take 1 tablet by mouth daily as needed (stress).    ? ondansetron (ZOFRAN-ODT) 4 MG disintegrating tablet DISSOLVE 1 TABLET IN MOUTH EVERY 8 HOURS AS NEEDED FOR NAUSEA 30 tablet 0  ? potassium chloride (KLOR-CON) 20 MEQ packet Take 20 mEq by mouth daily. 20 packet 0  ? PROCTO-MED HC 2.5 % rectal cream Place 1 application  rectally daily as needed for hemorrhoids.    ? Saccharomyces boulardii (DIGESTIVE PROBIOTIC PO) Take 1 capsule by mouth daily.    ? timolol (TIMOPTIC) 0.5 % ophthalmic solution Place 1 drop into both eyes daily.    ? zoledronic acid (RECLAST) 5 MG/100ML SOLN injection Inject 5 mg into the vein every 6 (six) months. EVERY 6MOS (due in May/Nov)    ? zolpidem (AMBIEN) 10 MG tablet Take 10 mg by mouth at bedtime as needed.    ? zolpidem (AMBIEN) 5 MG tablet Take 5 mg by mouth at bedtime as needed for sleep.    ? ?No current facility-administered medications for this visit.  ? ? ?PHYSICAL EXAMINATION: ?ECOG PERFORMANCE STATUS: 1 - Symptomatic but completely ambulatory ? ?Vitals:  ? 06/19/21 1037  ?BP: 131/61  ?Pulse: 64  ?Resp: 18  ?Temp: (!) 97.2 ?F (36.2 ?C)  ?SpO2: 100%  ? ?Filed Weights  ? 06/19/21 1037  ?Weight: 97 lb (44 kg)  ? ?  ? ?LABORATORY DATA:  ?I have reviewed the data as listed ? ?  Latest Ref Rng & Units 06/19/2021  ?   10:28 AM 03/13/2021  ?  2:28 PM 01/17/2021  ?  9:38 AM  ?CMP  ?Glucose 70 - 99 mg/dL 66   102   84    ?BUN 8 - 23 mg/dL 11   8   9    ?Creatinine 0.44 - 1.00 mg/dL 0.72   0.69   0.68    ?Sodium 135 - 145 mmol/L 138   137   139    ?Potassium 3.5 - 5.1 mmol/L 3.5   4.0   3.3    ?Chloride 98 - 111 mmol/L 102   106   105    ?CO2 22 - 32 mmol/L 32   23   28    ?Calcium 8.9 - 10.3 mg/dL 9.7   9.5   8.9    ?Total Protein 6.5 - 8.1 g/dL 7.8   7.1   7.7    ?Total Bilirubin 0.3 - 1.2 mg/dL 0.3   0.5   0.7    ?Alkaline Phos 38 - 126 U/L 59   61   74    ?AST 15 - 41 U/L 24   22   25    ?ALT 0 - 44 U/L 18   18   25    ? ? ?Lab Results  ?Component Value Date  ? WBC 5.6 06/19/2021  ? HGB 13.2 06/19/2021  ? HCT 37.8 06/19/2021  ? MCV 100.5 (H) 06/19/2021  ? PLT 177 06/19/2021  ? NEUTROABS 2.9 06/19/2021  ? ? ?ASSESSMENT & PLAN:  ?Malignant neoplasm of upper-inner quadrant of left breast in female, estrogen receptor positive ([facility]) ?07/04/2016: Left lumpectomy: 2.8 cm grade 1 ILC with LCIS, 0/4 lymph nodes  negative, posterior and anterior margins focally positive ?08/08/2016 -09/24/2016: Adjuvant radiation with Dr. Moody ? ?Current treatment: Letrozole 2.5 mg started August 2018 ?Letrozole toxicities: ?Hair thinning ?

## 2021-06-19 NOTE — Assessment & Plan Note (Addendum)
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 Lisbeth Renshaw ? ?Current treatment: Letrozole 2.5 mg started August 2018 ?Letrozole toxicities: ? ?Breast cancer surveillance: ?1.  Breast exam 06/19/2021: Benign ?2. Mammogram 09/28/2020: Benign breast density category C ?CT chest 02/21/2021: Postsurgical changes in the left breast and left axilla, tree-in-bud mucus plugging right middle lobe ?Bone density 01/02/2021: T score -2.1: Osteopenia ? ?Chest wall wound: Follows with Dr. Marla Roe ? ?Return to clinic in 1 year for follow-up ?

## 2021-06-20 ENCOUNTER — Other Ambulatory Visit: Payer: Self-pay | Admitting: Hematology

## 2021-06-22 ENCOUNTER — Ambulatory Visit: Payer: BC Managed Care – PPO | Admitting: Surgical

## 2021-06-28 ENCOUNTER — Ambulatory Visit: Payer: BC Managed Care – PPO | Admitting: Surgical

## 2021-07-05 ENCOUNTER — Ambulatory Visit: Payer: BC Managed Care – PPO | Admitting: Surgical

## 2021-07-06 ENCOUNTER — Encounter: Payer: Self-pay | Admitting: Surgical

## 2021-07-06 ENCOUNTER — Ambulatory Visit (INDEPENDENT_AMBULATORY_CARE_PROVIDER_SITE_OTHER): Payer: BC Managed Care – PPO | Admitting: Surgical

## 2021-07-06 DIAGNOSIS — M549 Dorsalgia, unspecified: Secondary | ICD-10-CM | POA: Diagnosis not present

## 2021-07-06 DIAGNOSIS — S21109A Unspecified open wound of unspecified front wall of thorax without penetration into thoracic cavity, initial encounter: Secondary | ICD-10-CM

## 2021-07-06 DIAGNOSIS — Z853 Personal history of malignant neoplasm of breast: Secondary | ICD-10-CM

## 2021-07-06 NOTE — Progress Notes (Signed)
   Referring Provider Pomposini, Cherly Anderson, MD No address on file   CC:  Chief Complaint  Patient presents with   Follow-up      Sally Smith is an 61 y.o. female.  HPI: Patient is a 61 year old female here for follow-up after left latissimus myocutaneous muscle flap for chest wall reconstruction with Dr. Marla Roe and Dr. Kipp Brood on 03/15/2021.  She is here with her husband.  She does have a history of radiation to the left breast and a partial mastectomy on the left side.  She presents today for concerns of increased tenderness to her back after lifting heavy object at home, concern for ongoing bruising.  She reports otherwise she is doing well, she is excited for her daughter's baby shower this weekend.  Review of Systems General: No fevers or chills  Physical Exam    06/19/2021   10:37 AM 03/17/2021    8:50 AM 03/17/2021    4:41 AM  Vitals with BMI  Height 5' 2.5"    Weight 97 lbs    BMI 70.01    Systolic [phone]  Diastolic 61 55 53  Pulse 64 102 103    General:  No acute distress,  Alert and oriented, Non-Toxic, Normal speech and affect Left breast: Left breast latissimus myocutaneous muscle flap paddle with good color and capillary refill, incision is well-healed.  There is some residual Dermabond still in place, no surrounding erythema or cellulitic changes.  I do not appreciate any specific areas of bruising noted.  Back: Left back incision is healing well, no subcutaneous fluid collection with palpation.  No overlying skin changes.  Some tenderness noted with palpation along her lower back where residual latissimus muscle is present after latissimus flap.  No ecchymosis noted.   Assessment/Plan Patient is doing well status post left myocutaneous muscle flap for reconstruction of the left chest wall with Dr. Marla Roe and Dr. Kipp Brood.  I do not see any signs of concern on exam, she appears to be healing really well.  Discussed with patient a referral to  physical therapy for improvement and strength in range of motion, she is very excited for this.  Recommend following up in 6 months for reevaluation, call with questions or concerns. Pictures were obtained of the patient and placed in the chart with the patient's or guardian's permission.   Carola Rhine Gurnie Duris 07/06/2021, 1:27 PM

## 2021-07-10 ENCOUNTER — Other Ambulatory Visit: Payer: Self-pay | Admitting: *Deleted

## 2021-07-10 MED ORDER — ONDANSETRON 4 MG PO TBDP: ORAL_TABLET | ORAL | 0 refills | Status: DC

## 2021-07-10 MED ORDER — LETROZOLE 2.5 MG PO TABS: 2.5000 mg | ORAL_TABLET | Freq: Every day | ORAL | 6 refills | Status: DC

## 2021-07-12 ENCOUNTER — Other Ambulatory Visit: Payer: Self-pay

## 2021-07-12 ENCOUNTER — Encounter: Payer: Self-pay | Admitting: *Deleted

## 2021-07-12 ENCOUNTER — Other Ambulatory Visit: Payer: BC Managed Care – PPO

## 2021-07-12 ENCOUNTER — Other Ambulatory Visit: Payer: Self-pay | Admitting: Hematology

## 2021-07-12 ENCOUNTER — Inpatient Hospital Stay: Payer: BC Managed Care – PPO

## 2021-07-12 ENCOUNTER — Ambulatory Visit: Payer: BC Managed Care – PPO | Admitting: Hematology

## 2021-07-12 VITALS — BP 108/56 | HR 59 | Temp 98.9°F | Resp 18 | Wt 96.5 lb

## 2021-07-12 DIAGNOSIS — C50212 Malignant neoplasm of upper-inner quadrant of left female breast: Secondary | ICD-10-CM | POA: Diagnosis not present

## 2021-07-12 MED ORDER — SODIUM CHLORIDE 0.9 % IV SOLN: Freq: Once | INTRAVENOUS | Status: AC

## 2021-07-12 MED ORDER — DIPHENHYDRAMINE HCL 50 MG/ML IJ SOLN
25.0000 mg | Freq: Once | INTRAMUSCULAR | Status: AC
  Administered 2021-07-12: 25 mg via INTRAVENOUS

## 2021-07-12 MED ORDER — ZOLEDRONIC ACID 4 MG/100ML IV SOLN
4.0000 mg | Freq: Once | INTRAVENOUS | Status: AC
  Administered 2021-07-12: 4 mg via INTRAVENOUS
  Filled 2021-07-12: qty 100

## 2021-07-12 NOTE — Progress Notes (Signed)
Patient had a sensitivity reaction to zometa today. During infusion- about halfway through the bottle- patient started itching her arm near the infusion and it traveled up and around that arm (R). The infusion was stopped, but it also showed across her neck where it was itching and she had a flush across all itchy areas and on her face (above and beyond her roseacea) and a little on her forehead. At no point was her airway involved- VSS and no SOB/Coughing etc. '25mg'$  od IV benadryl was given per Dr. Lindi Adie and the infusion was stopped. Appointment made with MD to discuss other options- patient was very concerned about loss of process.  Hypersensitivity Reaction note  Date of event: 07/12/21 Time of event: 1158 Generic name of drug involved: Zoledronic acid Name of provider notified of the hypersensitivity reaction: Dr. Lindi Adie Was agent that likely caused hypersensitivity reaction added to Allergies List within EMR? yes Chain of events including reaction signs/symptoms, treatment administered, and outcome (e.g., drug resumed; drug discontinued; sent to Emergency Department; etc.) see above note  Alvera Singh, RN 07/12/2021 4:55 PM

## 2021-07-12 NOTE — Patient Instructions (Signed)
[facility]  Discharge Instructions: Thank you for choosing [facility] to provide your oncology and hematology care.   If you have a lab appointment with the [facility], please go directly to the [facility] and check in at the registration area.   Wear comfortable clothing and clothing appropriate for easy access to any Portacath or PICC line.   We strive to give you quality time with your provider. You may need to reschedule your appointment if you arrive late (15 or more minutes).  Arriving late affects you and other patients whose appointments are after yours.  Also, if you miss three or more appointments without notifying the office, you may be dismissed from the clinic at the provider's discretion.      For prescription refill requests, have your pharmacy contact our office and allow 72 hours for refills to be completed.    Today you received the following chemotherapy and/or immunotherapy agent: Zometa     To help prevent nausea and vomiting after your treatment, we encourage you to take your nausea medication as directed.  BELOW ARE SYMPTOMS THAT SHOULD BE REPORTED IMMEDIATELY: *FEVER GREATER THAN 100.4 F (38 C) OR HIGHER *CHILLS OR SWEATING *NAUSEA AND VOMITING THAT IS NOT CONTROLLED WITH YOUR NAUSEA MEDICATION *UNUSUAL SHORTNESS OF BREATH *UNUSUAL BRUISING OR BLEEDING *URINARY PROBLEMS (pain or burning when urinating, or frequent urination) *BOWEL PROBLEMS (unusual diarrhea, constipation, pain near the anus) TENDERNESS IN MOUTH AND THROAT WITH OR WITHOUT PRESENCE OF ULCERS (sore throat, sores in mouth, or a toothache) UNUSUAL RASH, SWELLING OR PAIN  UNUSUAL VAGINAL DISCHARGE OR ITCHING   Items with * indicate a potential emergency and should be followed up as soon as possible or go to the Emergency Department if any problems should occur.  Please show the CHEMOTHERAPY ALERT CARD or IMMUNOTHERAPY ALERT CARD at check-in to the  Emergency Department and triage nurse.  Should you have questions after your visit or need to cancel or reschedule your appointment, please contact [facility]  Dept: [phone]  and follow the prompts.  Office hours are 8:00 a.m. to 4:30 p.m. Monday - Friday. Please note that voicemails left after 4:00 p.m. may not be returned until the following business day.  We are closed weekends and major holidays. You have access to a nurse at all times for urgent questions. Please call the main number to the clinic Dept: [phone] and follow the prompts.   For any non-urgent questions, you may also contact your provider using MyChart. We now offer e-Visits for anyone 63 and older to request care online for non-urgent symptoms. For details visit [email or web address].   Also download the MyChart app! Go to the app store, search "MyChart", open the app, select [facility], and log in with your MyChart username and password.  Due to Covid, a mask is required upon entering the hospital/clinic. If you do not have a mask, one will be given to you upon arrival. For doctor visits, patients may have 1 support person aged 63 or older with them. For treatment visits, patients cannot have anyone with them due to current Covid guidelines and our immunocompromised population.

## 2021-07-12 NOTE — Progress Notes (Signed)
Received call from infusion stating pt experiencing extreme itching half way through IV Zometa infusion today.  Per MD infusion to be stopped and pt to be administered Benadryl 25 mg IV and Zometa will not be restarted at this time. MD states we will hold off on infusions and reassess in 3 months.  Pt educated and very tearful stating she would like to continue with infusions.  RN educated pt of possible worsening allergic reaction if continuation, pt verbalized understanding.   MD f/u scheduled, pt notified and verbalized understanding.

## 2021-07-13 ENCOUNTER — Other Ambulatory Visit: Payer: Self-pay | Admitting: *Deleted

## 2021-07-13 MED ORDER — BUPROPION HCL ER (XL) 150 MG PO TB24: 150.0000 mg | ORAL_TABLET | Freq: Every day | ORAL | 3 refills | Status: DC

## 2021-08-14 ENCOUNTER — Other Ambulatory Visit: Payer: Self-pay | Admitting: *Deleted

## 2021-08-14 MED ORDER — ONDANSETRON 4 MG PO TBDP: ORAL_TABLET | ORAL | 1 refills | Status: DC

## 2021-08-14 MED ORDER — BUPROPION HCL ER (XL) 150 MG PO TB24: 150.0000 mg | ORAL_TABLET | Freq: Every day | ORAL | 3 refills | Status: DC

## 2021-09-06 ENCOUNTER — Telehealth: Payer: Self-pay | Admitting: Physician Assistant

## 2021-09-06 ENCOUNTER — Telehealth: Payer: Self-pay | Admitting: Plastic Surgery

## 2021-09-06 MED ORDER — TRAMADOL HCL 50 MG PO TABS: 50.0000 mg | ORAL_TABLET | Freq: Three times a day (TID) | ORAL | 0 refills | Status: AC | PRN

## 2021-09-06 NOTE — Telephone Encounter (Signed)
Patient has an issue with her back - extreme pain can you contact her

## 2021-09-06 NOTE — Telephone Encounter (Cosign Needed)
Patient hurt back requesting medication

## 2021-09-07 ENCOUNTER — Telehealth: Payer: Self-pay | Admitting: Plastic Surgery

## 2021-09-07 NOTE — Telephone Encounter (Signed)
Spoke with patient regarding pain in her back on the left.  She was cleaning the bathroom when she strained her back.  It is the same side as the latissimus muscle flap. I encouraged her to talk with her PCP as she may need an exam or an xray.  In the meantime we will send in ultram.  I instructed her to be careful with heat or ice so she does not burn herself.

## 2021-09-14 ENCOUNTER — Other Ambulatory Visit: Payer: Self-pay | Admitting: *Deleted

## 2021-09-14 DIAGNOSIS — Z170 Estrogen receptor positive status [ER+]: Secondary | ICD-10-CM

## 2021-09-14 MED ORDER — RADIAPLEXRX EX GEL: CUTANEOUS | 1 refills | Status: DC

## 2021-10-05 NOTE — Progress Notes (Signed)
Patient Care Team: Pomposini, Cherly Anderson, MD as PCP - General (Internal Medicine) Stark Klein, MD as Consulting Physician (General Surgery) Kyung Rudd, MD as Consulting Physician (Radiation Oncology) Delice Bison Charlestine Massed, NP as Nurse Practitioner (Hematology and Oncology) Nicholas Lose, MD as Consulting Physician (Hematology and Oncology)  DIAGNOSIS:  Encounter Diagnosis  Name Primary?   Malignant neoplasm of upper-inner quadrant of left breast in female, estrogen receptor positive ([place])     SUMMARY OF ONCOLOGIC HISTORY: Oncology History Overview Note  Cancer Staging Malignant neoplasm of upper-inner quadrant of left breast in female, estrogen receptor positive ([place]) Staging form: Breast, AJCC 8th Edition - Clinical stage from 05/22/2016: Stage IA (cT1a, cN0, cM0, G2, ER: Positive, PR: Positive, HER2: Negative) - Signed by [name], MD on 05/29/2016     Malignant neoplasm of upper-inner quadrant of left breast in female, estrogen receptor positive ([place])  05/10/2016 Mammogram   Bilateral screening mammogram on 05/10/16 showed a possible distortion with calcifications in the left breast.   05/16/2016 Mammogram   Diagnostic mammogram and US showed Persistent distortion in the upper inner quadrant of the left breast possibly correlating with the sonographic area of acoustic shadowing, measuring about 41mm. [place] of left axilla (-)   05/22/2016 Receptors her2   ER 100%, PR 70%+, Ki67 5%   05/22/2016 Initial Biopsy   Diagnosis Breast, left, needle core biopsy, upper inner - INVASIVE AND IN SITU LOBULAR CARCINOMA, G1-2   05/22/2016 Initial Diagnosis   Malignant neoplasm of upper-inner quadrant of left breast in female, estrogen receptor positive ([place])   05/31/2016 Genetic Testing   Genetic counseling and testing for hereditary cancer syndromes performed on 05/31/2016. Results are negative for pathogenic mutations in 46 genes analyzed by [facility]'s Common Hereditary Cancers Panel. Results are  dated 06/11/2016. Genes analyzed include: APC, ATM, AXIN2, BARD1, BMPR1A, BRCA1, BRCA2, BRIP1, CDH1, CDKN2A, CHEK2, CTNNA1, DICER1, EPCAM, GREM1, HOXB13, KIT, MEN1, MLH1, MSH2, MSH3, MSH6, MUTYH, NBN, NF1, NTHL1, PALB2, PDGFRA, PMS2, POLD1, POLE, PTEN, RAD50, RAD51C, RAD51D, SDHA, SDHB, SDHC, SDHD, SMAD4, SMARCA4, STK11, TP53, TSC1, TSC2, and VHL.    07/01/2016 Oncotype testing   Oncotype testing showed reoccurance score of 11. With 10 year risk of distant reoccurrence 7% with tamoxifen alone   07/04/2016 Surgery   Left breast lumpectomy with radioactive seed x2 and sentinel lymph node biopsy by Dr. Barry Dienes   08/08/2016 - 09/24/2016 Radiation Therapy   Patient underwent radiation with Dr.Moody   09/2016 -  Anti-estrogen oral therapy   Letrozole 2.5 mg daily starting 09/2016   05/13/2017 Mammogram   Bilateral diagnostic mammogram and left ultrasonography  IMPRESSION:  1. No suspicious mammographic or sonographic abnormalities in the areas of patient's LEFT breast pain.  2. No mammographic evidence of breast malignancy.    10/14/2017 Imaging   10/14/2017 Breast MRI IMPRESSION: No MRI evidence of malignancy in either breast. Left lumpectomy changes. No adenopathy.     CHIEF COMPLIANT: Follow-up to discuss Prolia after Zomata reaction  INTERVAL HISTORY: Sally Smith is a 61 Y.o. with the above mention on Letrozole. She presents to the clinic today to discuss Prolia. She states that she had itching on her neck and back. Denies sever itching. Overall she is fine.  ALLERGIES:  is allergic to bee venom, zometa [zoledronic acid], erythromycin base, latex, sulfa antibiotics, contrast media [iodinated contrast media], and wound dressing adhesive.  MEDICATIONS:  Current Outpatient Medications  Medication Sig Dispense Refill   aspirin EC 81 MG tablet Take 81 mg by mouth daily.  Swallow whole.     atorvastatin (LIPITOR) 20 MG tablet Take 20 mg by mouth daily.     buPROPion (WELLBUTRIN XL)  150 MG 24 hr tablet Take 1 tablet (150 mg total) by mouth daily. 90 tablet 3   Calcium Carb-Cholecalciferol (CALCIUM-VITAMIN D) 500-200 MG-UNIT tablet Take 1 tablet by mouth daily.      cholecalciferol (VITAMIN D) 1000 units tablet Take 1,000 Units by mouth daily.     docusate sodium (COLACE) 100 MG capsule Take 100 mg by mouth daily.     EPINEPHrine 0.3 mg/0.3 mL IJ SOAJ injection Inject 0.3 mg into the muscle as needed for anaphylaxis.     fluticasone (FLONASE) 50 MCG/ACT nasal spray fluticasone propionate 50 mcg/actuation nasal spray,suspension  Spray 2 sprays every day by intranasal route as directed.     hyaluronate sodium (RADIAPLEXRX) GEL APPLY TOPICALLY ONCE AS NEEDED FOR UP TO 1 DOSE 170 g 1   ibuprofen (ADVIL,MOTRIN) 200 MG tablet Take 400 mg by mouth every 6 (six) hours as needed for fever, headache, mild pain, moderate pain or cramping.     latanoprost (XALATAN) 0.005 % ophthalmic solution Place 1 drop into both eyes at bedtime.     letrozole (FEMARA) 2.5 MG tablet Take 1 tablet (2.5 mg total) by mouth daily. 30 tablet 6   loratadine (CLARITIN) 5 MG chewable tablet Chew 5 mg by mouth daily.     Multiple Vitamin (STRESS FORMULA PO) Take 1 tablet by mouth daily as needed (stress).     ondansetron (ZOFRAN-ODT) 4 MG disintegrating tablet DISSOLVE 1 TABLET IN MOUTH EVERY 8 HOURS AS NEEDED FOR NAUSEA 30 tablet 1   potassium chloride (KLOR-CON) 20 MEQ packet Take 20 mEq by mouth daily. 20 packet 0   PROCTO-MED HC 2.5 % rectal cream Place 1 application rectally daily as needed for hemorrhoids.     Saccharomyces boulardii (DIGESTIVE PROBIOTIC PO) Take 1 capsule by mouth daily.     timolol (TIMOPTIC) 0.5 % ophthalmic solution Place 1 drop into both eyes daily.     zoledronic acid (RECLAST) 5 MG/100ML SOLN injection Inject 5 mg into the vein every 6 (six) months. EVERY 6MOS (due in May/Nov)     LORazepam (ATIVAN) 1 MG tablet Take 1 tablet (1 mg total) by mouth 3 (three) times daily as needed for  anxiety. 30 tablet 3   zolpidem (AMBIEN) 10 MG tablet Take 1 tablet (10 mg total) by mouth at bedtime as needed. 30 tablet 3   No current facility-administered medications for this visit.    PHYSICAL EXAMINATION: ECOG PERFORMANCE STATUS: 1 - Symptomatic but completely ambulatory  Vitals:   10/06/21 1041  BP: 121/65  Pulse: 61  Temp: 98.6 F (37 C)  SpO2: 100%   Filed Weights   10/06/21 1041  Weight: 98 lb 4.8 oz (44.6 kg)     LABORATORY DATA:  I have reviewed the data as listed    Latest Ref Rng & Units 06/19/2021   10:28 AM 03/13/2021    2:28 PM 01/17/2021    9:38 AM  CMP  Glucose 70 - 99 mg/dL 66  102  84   BUN 8 - 23 mg/dL $Remove'11  8  9   'abGIluO$ Creatinine 0.44 - 1.00 mg/dL 0.72  0.69  0.68   Sodium 135 - 145 mmol/L 138  137  139   Potassium 3.5 - 5.1 mmol/L 3.5  4.0  3.3   Chloride 98 - 111 mmol/L 102  106  105   CO2  22 - 32 mmol/L 32  23  28   Calcium 8.9 - 10.3 mg/dL 9.7  9.5  8.9   Total Protein 6.5 - 8.1 g/dL 7.8  7.1  7.7   Total Bilirubin 0.3 - 1.2 mg/dL 0.3  0.5  0.7   Alkaline Phos 38 - 126 U/L 59  61  74   AST 15 - 41 U/L $Remo'24  22  25   'ABMuH$ ALT 0 - 44 U/L $Remo'18  18  25     'LAKxT$ Lab Results  Component Value Date   WBC 5.6 06/19/2021   HGB 13.2 06/19/2021   HCT 37.8 06/19/2021   MCV 100.5 (H) 06/19/2021   PLT 177 06/19/2021   NEUTROABS 2.9 06/19/2021    ASSESSMENT & PLAN:  Malignant neoplasm of upper-inner quadrant of left breast in female, estrogen receptor positive ([place]) 07/04/2016: Left lumpectomy: 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 Lisbeth Renshaw   Current treatment: Letrozole 2.5 mg started August 2018 Letrozole toxicities: Hair thinning Joint stiffness: Using CBD oil which has helped her significantly. Vaginal dryness: Patient is planning on starting a nonhormonal medication for the vaginal dryness.  We did briefly discussed about Josph Macho touch as well as  Replens.   Breast cancer surveillance: 1.  Breast exam 06/19/2021: Benign 2. Mammogram 09/28/2020: Benign breast density category C CT chest 02/21/2021: Postsurgical changes in the left breast and left axilla, tree-in-bud mucus plugging right middle lobe Bone density 01/02/2021: T score -2.1: Osteopenia: Zometa infusion: Caused   itching Patient is motivated to receive Zometa therefore we will premedicate her and proceed with infusion. We will need to premedicate her with Benadryl before the infusion.  Chest wall wound: Follows with Dr. Marla Roe, doing significantly better and healing better.  I renewed her Ambien and Ativan prescriptions today.  PCP has retired.   Return to clinic in May 2024 for follow-up    No orders of the defined types were placed in this encounter.  The patient has a good understanding of the overall plan. she agrees with it. she will call with any problems that may develop before the next visit here. Total time spent: 30 mins including face to face time and time spent for planning, charting and co-ordination of care   Harriette Ohara, MD 10/06/21    I Gardiner Coins am scribing for Dr. Lindi Adie  I have reviewed the above documentation for accuracy and completeness, and I agree with the above.

## 2021-10-06 ENCOUNTER — Inpatient Hospital Stay: Payer: BC Managed Care – PPO | Attending: Hematology and Oncology | Admitting: Hematology and Oncology

## 2021-10-06 ENCOUNTER — Other Ambulatory Visit: Payer: Self-pay

## 2021-10-06 DIAGNOSIS — C50212 Malignant neoplasm of upper-inner quadrant of left female breast: Secondary | ICD-10-CM | POA: Insufficient documentation

## 2021-10-06 DIAGNOSIS — M8580 Other specified disorders of bone density and structure, unspecified site: Secondary | ICD-10-CM | POA: Diagnosis not present

## 2021-10-06 DIAGNOSIS — Z79811 Long term (current) use of aromatase inhibitors: Secondary | ICD-10-CM | POA: Insufficient documentation

## 2021-10-06 DIAGNOSIS — Z170 Estrogen receptor positive status [ER+]: Secondary | ICD-10-CM | POA: Diagnosis not present

## 2021-10-06 MED ORDER — LORAZEPAM 1 MG PO TABS: 1.0000 mg | ORAL_TABLET | Freq: Three times a day (TID) | ORAL | 3 refills | Status: DC | PRN

## 2021-10-06 MED ORDER — ZOLPIDEM TARTRATE 10 MG PO TABS: 10.0000 mg | ORAL_TABLET | Freq: Every evening | ORAL | 3 refills | Status: DC | PRN

## 2021-10-06 NOTE — Assessment & Plan Note (Signed)
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 Lisbeth Renshaw  Current treatment: Letrozole 2.5 mg started August 2018 Letrozole toxicities: 1. Hair thinning 2. Joint stiffness: Using CBD oil which has helped her significantly. 3. Vaginal dryness: Patient is planning on starting a nonhormonal medication for the vaginal dryness.  We did briefly discussed about Josph Macho touch as well as Replens.  Breast cancer surveillance: 1.  Breast exam 06/19/2021: Benign 2. Mammogram 09/28/2020: Benign breast density category C CT chest 02/21/2021: Postsurgical changes in the left breast and left axilla, tree-in-bud mucus plugging right middle lobe Bone density 01/02/2021: T score -2.1: Osteopenia: Zometa infusion: Caused extreme itching Patient is motivated to receive Zometa therefore we will premedicate her and proceed with infusion.  Chest wall wound: Follows with Dr. Marla Roe, doing significantly better and healing better.  Return to clinic in 1 year for follow-up

## 2021-10-17 ENCOUNTER — Other Ambulatory Visit: Payer: Self-pay | Admitting: *Deleted

## 2021-10-17 MED ORDER — ONDANSETRON 4 MG PO TBDP
ORAL_TABLET | ORAL | 1 refills | Status: AC
Start: 2021-10-17 — End: ?

## 2022-01-05 ENCOUNTER — Ambulatory Visit: Payer: BC Managed Care – PPO | Admitting: Plastic Surgery

## 2022-01-16 ENCOUNTER — Ambulatory Visit (INDEPENDENT_AMBULATORY_CARE_PROVIDER_SITE_OTHER): Payer: BC Managed Care – PPO | Admitting: Plastic Surgery

## 2022-01-16 ENCOUNTER — Encounter: Payer: Self-pay | Admitting: Plastic Surgery

## 2022-01-16 VITALS — BP 132/73 | HR 71

## 2022-01-16 DIAGNOSIS — Z923 Personal history of irradiation: Secondary | ICD-10-CM

## 2022-01-16 DIAGNOSIS — N6459 Other signs and symptoms in breast: Secondary | ICD-10-CM | POA: Diagnosis not present

## 2022-01-16 DIAGNOSIS — S21109A Unspecified open wound of unspecified front wall of thorax without penetration into thoracic cavity, initial encounter: Secondary | ICD-10-CM

## 2022-01-16 NOTE — Addendum Note (Signed)
Addended by: Lindon Romp on: 01/16/2022 04:38 PM   Modules accepted: Orders

## 2022-01-16 NOTE — Progress Notes (Signed)
   Subjective:    Patient ID: Sally Smith, female    DOB: 01/17/1961, 61 y.o.   MRN: [id number]  Patient is a 61 year old female here for evaluation of her left breast.  She had undergone left mastectomy with radiation.  She had several years of a nonhealing wound that was tender and discolored.  We ended up doing a left latissimus muscle flap for wound closure.  She is now doing very well and has had no additional problems with the wound.  Today she asks about the vessels that she notices on her native breast skin.  This is typical for what we see after radiation.  And are similar to varicose veins.  She also notices a funny sound when she puts her left arm down.  I initially was concerned that she may have a seroma but no seroma is noted and I think this is just part of the way the muscle comes around her axilla.  She thinks there may be some restriction in range of motion secondary to the surgery.  She did not get the physical therapy at the time a year ago because she was dealing with a ill grandchild.  She is interested in doing that now.      Review of Systems  Constitutional: Negative.   HENT: Negative.    Eyes: Negative.   Respiratory: Negative.  Negative for chest tightness and shortness of breath.   Cardiovascular: Negative.   Gastrointestinal: Negative.   Endocrine: Negative.   Genitourinary: Negative.   Musculoskeletal: Negative.        Objective:   Physical Exam Vitals reviewed.  Constitutional:      Appearance: Normal appearance.  HENT:     Head: Normocephalic.  Cardiovascular:     Rate and Rhythm: Normal rate.     Pulses: Normal pulses.  Pulmonary:     Effort: Pulmonary effort is normal.  Musculoskeletal:        General: No swelling or deformity.  Skin:    General: Skin is warm.  Neurological:     Mental Status: She is oriented to person, place, and time.  Psychiatric:        Mood and Affect: Mood normal.        Behavior: Behavior normal.         Thought Content: Thought content normal.        Judgment: Judgment normal.        Assessment & Plan:     ICD-10-CM   1. Open wound of chest wall, unspecified laterality, initial encounter  S21.109A       Happy to do some laser on the left breast to see if we can get those vessels to lighten up.  She would also do very well to get laser on her rosacea on her face.  Her back looks good and there is no sign of seroma so we will see her back when she decides to get laser on her breast.  I also gave her prescription for second to nature and encouraged her to get fitted for a prosthetic.  She is also a good candidate for physical therapy and we will get that arranged for her.

## 2022-01-22 ENCOUNTER — Inpatient Hospital Stay: Payer: BC Managed Care – PPO | Attending: Hematology and Oncology

## 2022-01-22 ENCOUNTER — Inpatient Hospital Stay: Payer: BC Managed Care – PPO

## 2022-01-22 ENCOUNTER — Other Ambulatory Visit: Payer: Self-pay

## 2022-01-22 ENCOUNTER — Other Ambulatory Visit: Payer: Self-pay | Admitting: *Deleted

## 2022-01-22 VITALS — BP 125/76 | HR 67 | Temp 98.1°F | Resp 17 | Wt 103.5 lb

## 2022-01-22 DIAGNOSIS — C50212 Malignant neoplasm of upper-inner quadrant of left female breast: Secondary | ICD-10-CM | POA: Insufficient documentation

## 2022-01-22 DIAGNOSIS — M8580 Other specified disorders of bone density and structure, unspecified site: Secondary | ICD-10-CM | POA: Insufficient documentation

## 2022-01-22 LAB — CBC WITH DIFFERENTIAL (CANCER CENTER ONLY)
Abs Immature Granulocytes: 0.01 10*3/uL (ref 0.00–0.07)
Basophils Absolute: 0 10*3/uL (ref 0.0–0.1)
Basophils Relative: 1 %
Eosinophils Absolute: 0.1 10*3/uL (ref 0.0–0.5)
Eosinophils Relative: 2 %
HCT: 36.6 % (ref 36.0–46.0)
Hemoglobin: 12.5 g/dL (ref 12.0–15.0)
Immature Granulocytes: 0 %
Lymphocytes Relative: 36 %
Lymphs Abs: 1.5 10*3/uL (ref 0.7–4.0)
MCH: 35.6 pg — ABNORMAL HIGH (ref 26.0–34.0)
MCHC: 34.2 g/dL (ref 30.0–36.0)
MCV: 104.3 fL — ABNORMAL HIGH (ref 80.0–100.0)
Monocytes Absolute: 0.4 10*3/uL (ref 0.1–1.0)
Monocytes Relative: 10 %
Neutro Abs: 2.1 10*3/uL (ref 1.7–7.7)
Neutrophils Relative %: 51 %
Platelet Count: 157 10*3/uL (ref 150–400)
RBC: 3.51 MIL/uL — ABNORMAL LOW (ref 3.87–5.11)
RDW: 12.4 % (ref 11.5–15.5)
WBC Count: 4 10*3/uL (ref 4.0–10.5)
nRBC: 0 % (ref 0.0–0.2)

## 2022-01-22 LAB — CMP (CANCER CENTER ONLY)
ALT: 15 U/L (ref 0–44)
AST: 20 U/L (ref 15–41)
Albumin: 4.2 g/dL (ref 3.5–5.0)
Alkaline Phosphatase: 53 U/L (ref 38–126)
Anion gap: 6 (ref 5–15)
BUN: 7 mg/dL — ABNORMAL LOW (ref 8–23)
CO2: 26 mmol/L (ref 22–32)
Calcium: 9.5 mg/dL (ref 8.9–10.3)
Chloride: 111 mmol/L (ref 98–111)
Creatinine: 0.64 mg/dL (ref 0.44–1.00)
GFR, Estimated: >60 mL/min (ref >60)
Glucose, Bld: 97 mg/dL (ref 70–99)
Potassium: 4 mmol/L (ref 3.5–5.1)
Sodium: 143 mmol/L (ref 135–145)
Total Bilirubin: 0.3 mg/dL (ref 0.3–1.2)
Total Protein: 7.2 g/dL (ref 6.5–8.1)

## 2022-01-22 MED ORDER — ZOLEDRONIC ACID 4 MG/100ML IV SOLN
4.0000 mg | Freq: Once | INTRAVENOUS | Status: AC
  Administered 2022-01-22: 4 mg via INTRAVENOUS
  Filled 2022-01-22: qty 100

## 2022-01-22 MED ORDER — DIPHENHYDRAMINE HCL 25 MG PO CAPS
25.0000 mg | ORAL_CAPSULE | Freq: Once | ORAL | Status: AC
  Administered 2022-01-22: 25 mg via ORAL
  Filled 2022-01-22: qty 1

## 2022-01-22 MED ORDER — SODIUM CHLORIDE 0.9 % IV SOLN: INTRAVENOUS | Status: DC

## 2022-01-22 NOTE — Patient Instructions (Signed)

## 2022-02-09 ENCOUNTER — Other Ambulatory Visit: Payer: Self-pay | Admitting: Hematology and Oncology

## 2022-02-09 ENCOUNTER — Other Ambulatory Visit: Payer: Self-pay | Admitting: *Deleted

## 2022-02-09 MED ORDER — LORAZEPAM 1 MG PO TABS: 1.0000 mg | ORAL_TABLET | Freq: Three times a day (TID) | ORAL | 3 refills | Status: DC | PRN

## 2022-02-09 MED ORDER — LETROZOLE 2.5 MG PO TABS: 2.5000 mg | ORAL_TABLET | Freq: Every day | ORAL | 3 refills | Status: DC

## 2022-02-09 MED ORDER — ZOLPIDEM TARTRATE 10 MG PO TABS: 10.0000 mg | ORAL_TABLET | Freq: Every evening | ORAL | 3 refills | Status: DC | PRN

## 2022-05-30 ENCOUNTER — Other Ambulatory Visit: Payer: Self-pay | Admitting: *Deleted

## 2022-05-30 DIAGNOSIS — I6522 Occlusion and stenosis of left carotid artery: Secondary | ICD-10-CM

## 2022-05-30 DIAGNOSIS — I6529 Occlusion and stenosis of unspecified carotid artery: Secondary | ICD-10-CM

## 2022-06-01 ENCOUNTER — Telehealth: Payer: Self-pay | Admitting: *Deleted

## 2022-06-01 NOTE — Telephone Encounter (Signed)
Received VM from pt stating her gynecologist and PCP will be retiring this year and requesting advice from MD regarding referrals in [place].  RN attempt x1 to return call.  No answer.  LVM instructing pt she can go online with [facility] to find a PCP accepting new patients as well as a gynecologist.

## 2022-06-14 ENCOUNTER — Ambulatory Visit (INDEPENDENT_AMBULATORY_CARE_PROVIDER_SITE_OTHER): Payer: BC Managed Care – PPO | Admitting: Vascular Surgery

## 2022-06-14 ENCOUNTER — Ambulatory Visit (HOSPITAL_COMMUNITY)
Admission: RE | Admit: 2022-06-14 | Discharge: 2022-06-14 | Disposition: A | Discharge status: Home / Self Care | Payer: BC Managed Care – PPO | Source: Ambulatory Visit | Attending: Vascular Surgery | Admitting: Vascular Surgery

## 2022-06-14 ENCOUNTER — Encounter: Payer: Self-pay | Admitting: Vascular Surgery

## 2022-06-14 VITALS — BP 122/80 | HR 67 | Temp 98.2°F | Resp 20 | Ht 62.5 in | Wt 104.0 lb

## 2022-06-14 DIAGNOSIS — I6529 Occlusion and stenosis of unspecified carotid artery: Secondary | ICD-10-CM | POA: Diagnosis not present

## 2022-06-14 DIAGNOSIS — I6522 Occlusion and stenosis of left carotid artery: Secondary | ICD-10-CM | POA: Diagnosis not present

## 2022-06-14 NOTE — Progress Notes (Signed)
REASON FOR VISIT:   Follow-up of left carotid stenosis  MEDICAL ISSUES:   ASYMPTOMATIC 40 TO 59% LEFT CAROTID STENOSIS: This patient has an asymptomatic 40 to 59% left carotid stenosis.  This is in the mid and distal ICA.  This stenosis has not changed significantly over the last year.  She is on aspirin and is on a statin.  She understands we would not consider carotid endarterectomy unless the stenosis progressed to greater than 80% or she developed left hemispheric symptoms.  We have discussed the potential symptoms of cerebrovascular disease.  She would like to see a physician in 1 year so I will set her up with one of my partners with a follow-up carotid duplex scan at that time.  She knows to call sooner if she has problems.  HPI:   Sally Smith is a pleasant 62 y.o. female who I last saw on 02/09/2021 with an asymptomatic 40 to 59% left carotid stenosis.  She was set up for a 1 year follow-up visit.  She was on aspirin and was on a statin.  Since I saw her last, she denies any history of stroke, TIAs, expressive or receptive aphasia, or amaurosis fugax.  She is on aspirin and is on a statin.  Past Medical History:  Diagnosis Date   ADHD (attention deficit hyperactivity disorder)    Allergy    seasonal allergies   Anxiety    on meds   Breast cancer 07/03/2016   Cancer of left breast    [facility] genetic testing negative on 05/31/16   Carotid artery disease    Complication of anesthesia    Vagals easily to pain   Diverticulitis    Genetic testing 06/18/2016   Ms. Coey underwent genetic counseling and testing for hereditary cancer syndromes on 05/31/2016. Her results were negative for mutations in all 46 genes analyzed by [facility]'s 46-gene Common Hereditary Cancers Panel. Genes analyzed include: APC, ATM, AXIN2, BARD1, BMPR1A, BRCA1, BRCA2, BRIP1, CDH1, CDKN2A, CHEK2, CTNNA1, DICER1, EPCAM, GREM1, HOXB13, KIT, MEN1, MLH1, MSH2, MSH3, MSH6, MUTYH, NBN,   Glaucoma     on meds   Hyperlipidemia    on meds   Personal history of radiation therapy 2018   Left Breast Cancer   Pneumonia    PONV (postoperative nausea and vomiting)    Skin cancer    left breast     Family History  Problem Relation Age of Onset   Breast cancer Mother 47       Contralateral breast cancer a few years later that metastasized.   Colon cancer Neg Hx    Ovarian cancer Neg Hx    Endometrial cancer Neg Hx    Pancreatic cancer Neg Hx    Prostate cancer Neg Hx     SOCIAL HISTORY: Social History   Tobacco Use   Smoking status: Never   Smokeless tobacco: Never  Substance Use Topics   Alcohol use: Yes    Comment: rare    Allergies  Allergen Reactions   Bee Venom Anaphylaxis   Zometa [Zoledronic Acid] Itching    Patient developed a low grade itchy/flushed reaction to zometa 07/12/21- see notes.   Erythromycin Base Nausea And Vomiting   Latex Itching    welps   Sulfa Antibiotics Hives and Swelling   Contrast Media [Iodinated Contrast Media] Rash   Wound Dressing Adhesive Itching and Rash    Current Outpatient Medications  Medication Sig Dispense Refill   aspirin EC 81 MG tablet Take  81 mg by mouth daily. Swallow whole.     atorvastatin (LIPITOR) 20 MG tablet Take 20 mg by mouth daily.     buPROPion (WELLBUTRIN XL) 150 MG 24 hr tablet Take 1 tablet (150 mg total) by mouth daily. 90 tablet 3   Calcium Carb-Cholecalciferol (CALCIUM-VITAMIN D) 500-200 MG-UNIT tablet Take 1 tablet by mouth daily.      cholecalciferol (VITAMIN D) 1000 units tablet Take 1,000 Units by mouth daily.     docusate sodium (COLACE) 100 MG capsule Take 100 mg by mouth daily.     EPINEPHrine 0.3 mg/0.3 mL IJ SOAJ injection Inject 0.3 mg into the muscle as needed for anaphylaxis.     fluticasone (FLONASE) 50 MCG/ACT nasal spray fluticasone propionate 50 mcg/actuation nasal spray,suspension  Spray 2 sprays every day by intranasal route as directed.     hyaluronate sodium (RADIAPLEXRX) GEL APPLY  TOPICALLY ONCE AS NEEDED FOR UP TO 1 DOSE 170 g 1   ibuprofen (ADVIL,MOTRIN) 200 MG tablet Take 400 mg by mouth every 6 (six) hours as needed for fever, headache, mild pain, moderate pain or cramping.     latanoprost (XALATAN) 0.005 % ophthalmic solution Place 1 drop into both eyes at bedtime.     letrozole (FEMARA) 2.5 MG tablet Take 1 tablet (2.5 mg total) by mouth daily. 90 tablet 3   loratadine (CLARITIN) 5 MG chewable tablet Chew 5 mg by mouth daily.     LORazepam (ATIVAN) 1 MG tablet Take 1 tablet (1 mg total) by mouth 3 (three) times daily as needed for anxiety. 30 tablet 3   Multiple Vitamin (STRESS FORMULA PO) Take 1 tablet by mouth daily as needed (stress).     ondansetron (ZOFRAN-ODT) 4 MG disintegrating tablet DISSOLVE 1 TABLET IN MOUTH EVERY 8 HOURS AS NEEDED FOR NAUSEA 30 tablet 1   potassium chloride (KLOR-CON) 20 MEQ packet Take 20 mEq by mouth daily. 20 packet 0   PROCTO-MED HC 2.5 % rectal cream Place 1 application rectally daily as needed for hemorrhoids.     Saccharomyces boulardii (DIGESTIVE PROBIOTIC PO) Take 1 capsule by mouth daily.     timolol (TIMOPTIC) 0.5 % ophthalmic solution Place 1 drop into both eyes daily.     zoledronic acid (RECLAST) 5 MG/100ML SOLN injection Inject 5 mg into the vein every 6 (six) months. EVERY (due in May/Nov)     zolpidem (AMBIEN) 10 MG tablet Take 1 tablet (10 mg total) by mouth at bedtime as needed. 30 tablet 3   No current facility-administered medications for this visit.    REVIEW OF SYSTEMS:   denotes positive finding,  denotes negative finding Cardiac  Comments:  Chest pain or chest pressure:    Shortness of breath upon exertion:    Short of breath when lying flat:    Irregular heart rhythm:        Vascular    Pain in calf, thigh, or hip brought on by ambulation:    Pain in feet at night that wakes you up from your sleep:     Blood clot in your veins:    Leg swelling:         Pulmonary    Oxygen at home:     Productive cough:     Wheezing:         Neurologic    Sudden weakness in arms or legs:     Sudden numbness in arms or legs:     Sudden onset of difficulty speaking or  slurred speech:    Temporary loss of vision in one eye:     Problems with dizziness:         Gastrointestinal    Blood in stool:     Vomited blood:         Genitourinary    Burning when urinating:     Blood in urine:        Psychiatric    Major depression:         Hematologic    Bleeding problems:    Problems with blood clotting too easily:        Skin    Rashes or ulcers:        Constitutional    Fever or chills:     PHYSICAL EXAM:   Vitals:   06/14/22 1232  BP: 122/80  Pulse: 67  Resp: 20  Temp: 98.2 F (36.8 C)  SpO2: 98%  Weight: 104 lb (47.2 kg)  Height: 5' 2.5" (1.588 m)    GENERAL: The patient is a well-nourished female, in no acute distress. The vital signs are documented above. CARDIAC: There is a regular rate and rhythm.  VASCULAR: She has a right carotid bruit and I cannot detect a left carotid bruit.  This was somewhat surprising as her stenosis is on the left. She has palpable pedal pulses.   She has no significant lower extremity swelling. PULMONARY: There is good air exchange bilaterally without wheezing or rales. ABDOMEN: Soft and non-tender with normal pitched bowel sounds.  MUSCULOSKELETAL: There are no major deformities or cyanosis. NEUROLOGIC: No focal weakness or paresthesias are detected. SKIN: There are no ulcers or rashes noted. PSYCHIATRIC: The patient has a normal affect.  DATA:    CAROTID DUPLEX: I have independently interpreted her carotid duplex scan today.  On the right side there is no significant stenosis noted.  The right vertebral artery is patent with antegrade flow.  The subclavian artery is widely patent without stenosis.  On the left side there is a 40 to 59% stenosis in the mid and distal ICA.  The left vertebral artery is patent with antegrade flow.   There is no left subclavian artery stenosis.   Waverly Ferrari [facility] [phone]

## 2022-06-15 ENCOUNTER — Other Ambulatory Visit: Payer: Self-pay | Admitting: Hematology

## 2022-06-15 DIAGNOSIS — C50212 Malignant neoplasm of upper-inner quadrant of left female breast: Secondary | ICD-10-CM

## 2022-06-18 ENCOUNTER — Other Ambulatory Visit: Payer: Self-pay | Admitting: Hematology and Oncology

## 2022-06-18 MED ORDER — ZOLPIDEM TARTRATE 10 MG PO TABS: 10.0000 mg | ORAL_TABLET | Freq: Every evening | ORAL | 3 refills | Status: AC | PRN

## 2022-06-18 MED ORDER — LORAZEPAM 1 MG PO TABS: 1.0000 mg | ORAL_TABLET | Freq: Three times a day (TID) | ORAL | 3 refills | Status: AC | PRN

## 2022-06-22 ENCOUNTER — Other Ambulatory Visit: Payer: Self-pay

## 2022-06-22 DIAGNOSIS — Z170 Estrogen receptor positive status [ER+]: Secondary | ICD-10-CM

## 2022-06-22 NOTE — Progress Notes (Signed)
cbc

## 2022-06-23 NOTE — Progress Notes (Signed)
Patient Care Team: Pomposini, Rande Brunt, MD as PCP - General (Internal Medicine) Almond Lint, MD as Consulting Physician (General Surgery) Dorothy Puffer, MD as Consulting Physician (Radiation Oncology) Axel Filler Larna Daughters, NP as Nurse Practitioner (Hematology and Oncology) Serena Croissant, MD as Consulting Physician (Hematology and Oncology)  DIAGNOSIS: No diagnosis found.  SUMMARY OF ONCOLOGIC HISTORY: Oncology History Overview Note  Cancer Staging Malignant neoplasm of upper-inner quadrant of left breast in female, estrogen receptor positive (HCC) Staging form: Breast, AJCC 8th Edition - Clinical stage from 05/22/2016: Stage IA (cT1a, cN0, cM0, G2, ER: Positive, PR: Positive, HER2: Negative) - Signed by Malachy Mood, MD on 05/29/2016     Malignant neoplasm of upper-inner quadrant of left breast in female, estrogen receptor positive (HCC)  05/10/2016 Mammogram   Bilateral screening mammogram on 05/10/16 showed a possible distortion with calcifications in the left breast.   05/16/2016 Mammogram   Diagnostic mammogram and US showed Persistent distortion in the upper inner quadrant of the left breast possibly correlating with the sonographic area of acoustic shadowing, measuring about 6mm. [place] of left axilla (-)   05/22/2016 Receptors her2   ER 100%, PR 70%+, Ki67 5%   05/22/2016 Initial Biopsy   Diagnosis Breast, left, needle core biopsy, upper inner - INVASIVE AND IN SITU LOBULAR CARCINOMA, G1-2   05/22/2016 Initial Diagnosis   Malignant neoplasm of upper-inner quadrant of left breast in female, estrogen receptor positive (HCC)   05/31/2016 Genetic Testing   Genetic counseling and testing for hereditary cancer syndromes performed on 05/31/2016. Results are negative for pathogenic mutations in 46 genes analyzed by [facility]'s Common Hereditary Cancers Panel. Results are dated 06/11/2016. Genes analyzed include: APC, ATM, AXIN2, BARD1, BMPR1A, BRCA1, BRCA2, BRIP1, CDH1, CDKN2A, CHEK2, CTNNA1,  DICER1, EPCAM, GREM1, HOXB13, KIT, MEN1, MLH1, MSH2, MSH3, MSH6, MUTYH, NBN, NF1, NTHL1, PALB2, PDGFRA, PMS2, POLD1, POLE, PTEN, RAD50, RAD51C, RAD51D, SDHA, SDHB, SDHC, SDHD, SMAD4, SMARCA4, STK11, TP53, TSC1, TSC2, and VHL.    07/01/2016 Oncotype testing   Oncotype testing showed reoccurance score of 11. With 10 year risk of distant reoccurrence 7% with tamoxifen alone   07/04/2016 Surgery   Left breast lumpectomy with radioactive seed x2 and sentinel lymph node biopsy by Dr. Donell Beers   08/08/2016 - 09/24/2016 Radiation Therapy   Patient underwent radiation with Dr.Moody   09/2016 -  Anti-estrogen oral therapy   Letrozole 2.5 mg daily starting 09/2016   05/13/2017 Mammogram   Bilateral diagnostic mammogram and left ultrasonography  IMPRESSION:  1. No suspicious mammographic or sonographic abnormalities in the areas of patient's LEFT breast pain.  2. No mammographic evidence of breast malignancy.    10/14/2017 Imaging   10/14/2017 Breast MRI IMPRESSION: No MRI evidence of malignancy in either breast. Left lumpectomy changes. No adenopathy.     CHIEF COMPLIANT: Follow-up on Letrozole  INTERVAL HISTORY: Sally Smith is a 62 Y.o. with the above mentioned on Letrozole. She presents to the clinic today for a follow-up.   ALLERGIES:  is allergic to bee venom, zometa [zoledronic acid], erythromycin base, latex, sulfa antibiotics, contrast media [iodinated contrast media], and wound dressing adhesive.  MEDICATIONS:  Current Outpatient Medications  Medication Sig Dispense Refill   aspirin EC 81 MG tablet Take 81 mg by mouth daily. Swallow whole.     atorvastatin (LIPITOR) 20 MG tablet Take 20 mg by mouth daily.     buPROPion (WELLBUTRIN XL) 150 MG 24 hr tablet Take 1 tablet (150 mg total) by mouth daily. 90 tablet 3  Calcium Carb-Cholecalciferol (CALCIUM-VITAMIN D) 500-200 MG-UNIT tablet Take 1 tablet by mouth daily.      cholecalciferol (VITAMIN D) 1000 units tablet Take 1,000  Units by mouth daily.     docusate sodium (COLACE) 100 MG capsule Take 100 mg by mouth daily.     EPINEPHrine 0.3 mg/0.3 mL IJ SOAJ injection Inject 0.3 mg into the muscle as needed for anaphylaxis.     fluticasone (FLONASE) 50 MCG/ACT nasal spray fluticasone propionate 50 mcg/actuation nasal spray,suspension  Spray 2 sprays every day by intranasal route as directed.     hyaluronate sodium (RADIAPLEXRX) GEL APPLY TOPICALLY ONCE AS NEEDED FOR UP TO 1 DOSE 170 g 0   ibuprofen (ADVIL,MOTRIN) 200 MG tablet Take 400 mg by mouth every 6 (six) hours as needed for fever, headache, mild pain, moderate pain or cramping.     latanoprost (XALATAN) 0.005 % ophthalmic solution Place 1 drop into both eyes at bedtime.     letrozole (FEMARA) 2.5 MG tablet Take 1 tablet (2.5 mg total) by mouth daily. 90 tablet 3   loratadine (CLARITIN) 5 MG chewable tablet Chew 5 mg by mouth daily.     LORazepam (ATIVAN) 1 MG tablet Take 1 tablet (1 mg total) by mouth 3 (three) times daily as needed for anxiety. 30 tablet 3   Multiple Vitamin (STRESS FORMULA PO) Take 1 tablet by mouth daily as needed (stress).     ondansetron (ZOFRAN-ODT) 4 MG disintegrating tablet DISSOLVE 1 TABLET IN MOUTH EVERY 8 HOURS AS NEEDED FOR NAUSEA 30 tablet 1   potassium chloride (KLOR-CON) 20 MEQ packet Take 20 mEq by mouth daily. 20 packet 0   PROCTO-MED HC 2.5 % rectal cream Place 1 application rectally daily as needed for hemorrhoids.     Saccharomyces boulardii (DIGESTIVE PROBIOTIC PO) Take 1 capsule by mouth daily.     timolol (TIMOPTIC) 0.5 % ophthalmic solution Place 1 drop into both eyes daily.     zoledronic acid (RECLAST) 5 MG/100ML SOLN injection Inject 5 mg into the vein every 6 (six) months. EVERY (due in May/Nov)     zolpidem (AMBIEN) 10 MG tablet Take 1 tablet (10 mg total) by mouth at bedtime as needed. 30 tablet 3   No current facility-administered medications for this visit.    PHYSICAL EXAMINATION: ECOG PERFORMANCE STATUS:  {CHL ONC ECOG PS:[id number]}  There were no vitals filed for this visit. There were no vitals filed for this visit.  BREAST:*** No palpable masses or nodules in either right or left breasts. No palpable axillary supraclavicular or infraclavicular adenopathy no breast tenderness or nipple discharge. (exam performed in the presence of a chaperone)  LABORATORY DATA:  I have reviewed the data as listed    Latest Ref Rng & Units 01/22/2022   11:51 AM 06/19/2021   10:28 AM 03/13/2021    2:28 PM  CMP  Glucose 70 - 99 mg/dL 97  66  161   BUN 8 - 23 mg/dL 7  11  8    Creatinine 0.44 - 1.00 mg/dL 0.96  0.45  4.09   Sodium 135 - 145 mmol/L 143  138  137   Potassium 3.5 - 5.1 mmol/L 4.0  3.5  4.0   Chloride 98 - 111 mmol/L 111  102  106   CO2 22 - 32 mmol/L 26  32  23   Calcium 8.9 - 10.3 mg/dL 9.5  9.7  9.5   Total Protein 6.5 - 8.1 g/dL 7.2  7.8  7.1  Total Bilirubin 0.3 - 1.2 mg/dL 0.3  0.3  0.5   Alkaline Phos 38 - 126 U/L 53  59  61   AST 15 - 41 U/L 20  24  22    ALT 0 - 44 U/L 15  18  18      Lab Results  Component Value Date   WBC 4.0 01/22/2022   HGB 12.5 01/22/2022   HCT 36.6 01/22/2022   MCV 104.3 (H) 01/22/2022   PLT 157 01/22/2022   NEUTROABS 2.1 01/22/2022    ASSESSMENT & PLAN:  No problem-specific Assessment & Plan notes found for this encounter.    No orders of the defined types were placed in this encounter.  The patient has a good understanding of the overall plan. she agrees with it. she will call with any problems that may develop before the next visit here. Total time spent: 30 mins including face to face time and time spent for planning, charting and co-ordination of care   Sherlyn Lick, CMA 06/23/22    I Janan Ridge am acting as a Neurosurgeon for [employer]  ***

## 2022-06-25 ENCOUNTER — Inpatient Hospital Stay: Payer: BC Managed Care – PPO | Attending: Hematology and Oncology | Admitting: Hematology and Oncology

## 2022-06-25 ENCOUNTER — Inpatient Hospital Stay: Payer: BC Managed Care – PPO

## 2022-06-25 ENCOUNTER — Other Ambulatory Visit: Payer: Self-pay

## 2022-06-25 VITALS — BP 134/64 | HR 61 | Temp 97.9°F | Resp 18 | Ht 62.5 in | Wt 102.9 lb

## 2022-06-25 DIAGNOSIS — C50212 Malignant neoplasm of upper-inner quadrant of left female breast: Secondary | ICD-10-CM | POA: Insufficient documentation

## 2022-06-25 DIAGNOSIS — Z780 Asymptomatic menopausal state: Secondary | ICD-10-CM | POA: Diagnosis not present

## 2022-06-25 DIAGNOSIS — Z170 Estrogen receptor positive status [ER+]: Secondary | ICD-10-CM

## 2022-06-25 DIAGNOSIS — Z79811 Long term (current) use of aromatase inhibitors: Secondary | ICD-10-CM | POA: Diagnosis not present

## 2022-06-25 DIAGNOSIS — M8580 Other specified disorders of bone density and structure, unspecified site: Secondary | ICD-10-CM | POA: Diagnosis present

## 2022-06-25 LAB — CMP (CANCER CENTER ONLY)
ALT: 15 U/L (ref 0–44)
AST: 21 U/L (ref 15–41)
Albumin: 4.2 g/dL (ref 3.5–5.0)
Alkaline Phosphatase: 59 U/L (ref 38–126)
Anion gap: 6 (ref 5–15)
BUN: 8 mg/dL (ref 8–23)
CO2: 28 mmol/L (ref 22–32)
Calcium: 9.1 mg/dL (ref 8.9–10.3)
Chloride: 105 mmol/L (ref 98–111)
Creatinine: 0.73 mg/dL (ref 0.44–1.00)
GFR, Estimated: >60 mL/min (ref >60)
Glucose, Bld: 94 mg/dL (ref 70–99)
Potassium: 3.9 mmol/L (ref 3.5–5.1)
Sodium: 139 mmol/L (ref 135–145)
Total Bilirubin: 0.6 mg/dL (ref 0.3–1.2)
Total Protein: 7.9 g/dL (ref 6.5–8.1)

## 2022-06-25 LAB — CBC WITH DIFFERENTIAL (CANCER CENTER ONLY)
Abs Immature Granulocytes: 0.02 10*3/uL (ref 0.00–0.07)
Basophils Absolute: 0 10*3/uL (ref 0.0–0.1)
Basophils Relative: 1 %
Eosinophils Absolute: 0.1 10*3/uL (ref 0.0–0.5)
Eosinophils Relative: 1 %
HCT: 37 % (ref 36.0–46.0)
Hemoglobin: 12.6 g/dL (ref 12.0–15.0)
Immature Granulocytes: 0 %
Lymphocytes Relative: 23 %
Lymphs Abs: 1.3 10*3/uL (ref 0.7–4.0)
MCH: 35.4 pg — ABNORMAL HIGH (ref 26.0–34.0)
MCHC: 34.1 g/dL (ref 30.0–36.0)
MCV: 103.9 fL — ABNORMAL HIGH (ref 80.0–100.0)
Monocytes Absolute: 0.5 10*3/uL (ref 0.1–1.0)
Monocytes Relative: 9 %
Neutro Abs: 3.8 10*3/uL (ref 1.7–7.7)
Neutrophils Relative %: 66 %
Platelet Count: 164 10*3/uL (ref 150–400)
RBC: 3.56 MIL/uL — ABNORMAL LOW (ref 3.87–5.11)
RDW: 11.9 % (ref 11.5–15.5)
WBC Count: 5.7 10*3/uL (ref 4.0–10.5)
nRBC: 0 % (ref 0.0–0.2)

## 2022-06-25 MED ORDER — SODIUM CHLORIDE 0.9% FLUSH: 10.0000 mL | Freq: Once | INTRAVENOUS | Status: DC | PRN

## 2022-06-25 MED ORDER — DIPHENHYDRAMINE HCL 50 MG/ML IJ SOLN
25.0000 mg | Freq: Once | INTRAMUSCULAR | Status: AC
  Administered 2022-06-25: 25 mg via INTRAVENOUS
  Filled 2022-06-25: qty 1

## 2022-06-25 MED ORDER — SODIUM CHLORIDE 0.9% FLUSH: 3.0000 mL | Freq: Once | INTRAVENOUS | Status: DC | PRN

## 2022-06-25 MED ORDER — HEPARIN SOD (PORK) LOCK FLUSH 100 UNIT/ML IV SOLN: 500.0000 [IU] | Freq: Once | INTRAVENOUS | Status: DC | PRN

## 2022-06-25 MED ORDER — HEPARIN SOD (PORK) LOCK FLUSH 100 UNIT/ML IV SOLN: 250.0000 [IU] | Freq: Once | INTRAVENOUS | Status: DC | PRN

## 2022-06-25 MED ORDER — ZOLEDRONIC ACID 4 MG/100ML IV SOLN
4.0000 mg | Freq: Once | INTRAVENOUS | Status: AC
  Administered 2022-06-25: 4 mg via INTRAVENOUS
  Filled 2022-06-25: qty 100

## 2022-06-25 MED ORDER — ALTEPLASE 2 MG IJ SOLR: 2.0000 mg | Freq: Once | INTRAMUSCULAR | Status: DC | PRN

## 2022-06-25 NOTE — Assessment & Plan Note (Signed)
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 Mitzi Hansen   Current treatment: Letrozole 2.5 mg started August 2018 Letrozole toxicities: Hair thinning Joint stiffness: Using CBD oil which has helped her significantly. Vaginal dryness: Patient is planning on starting a nonhormonal medication for the vaginal dryness.  We did briefly discussed about Drue Stager touch as well as Replens.   Breast cancer surveillance: 1.  Breast exam 06/25/2022: Benign 2. Mammogram 09/28/2020: Benign breast density category C  Bone density 01/02/2021: T score -2.1: Osteopenia: Zometa infusion: Caused   itching We will need to premedicate her with Benadryl before the infusion.   Chest wall wound: Follows with Dr. Ulice Bold, doing significantly better and healing better.   I renewed her Ambien and Ativan prescriptions today.  PCP has retired.   Return to clinic in 1 year for follow-up

## 2022-06-25 NOTE — Patient Instructions (Signed)

## 2022-06-27 ENCOUNTER — Telehealth: Payer: Self-pay | Admitting: Hematology and Oncology

## 2022-06-27 NOTE — Telephone Encounter (Signed)
Scheduled appointments per 5/6 los. Patient is aware of the made appointments.

## 2022-07-12 ENCOUNTER — Ambulatory Visit
Admission: RE | Admit: 2022-07-12 | Discharge: 2022-07-12 | Disposition: A | Discharge status: Home / Self Care | Payer: BC Managed Care – PPO | Source: Ambulatory Visit | Attending: Hematology and Oncology | Admitting: Hematology and Oncology

## 2022-07-12 DIAGNOSIS — Z170 Estrogen receptor positive status [ER+]: Secondary | ICD-10-CM

## 2022-07-18 ENCOUNTER — Other Ambulatory Visit: Payer: Self-pay | Admitting: Hematology

## 2022-07-18 DIAGNOSIS — C50212 Malignant neoplasm of upper-inner quadrant of left female breast: Secondary | ICD-10-CM

## 2022-07-27 ENCOUNTER — Telehealth: Payer: Self-pay

## 2022-07-27 NOTE — Telephone Encounter (Signed)
Pt called to ask which GYN specialist Dr Pamelia Hoit recommended to pt. Upon reviewing notes, it is not indicated who he recommended.  Attempted to call pt to let her know. LVM detailed letting her know we were unable to find out who the GYN MD is.

## 2022-08-21 ENCOUNTER — Other Ambulatory Visit: Payer: Self-pay

## 2022-08-21 ENCOUNTER — Telehealth: Payer: Self-pay

## 2022-08-21 MED ORDER — PROCTO-MED HC 2.5 % EX CREA: 1.0000 | TOPICAL_CREAM | Freq: Every day | CUTANEOUS | 0 refills | Status: AC | PRN

## 2022-08-21 MED ORDER — ATORVASTATIN CALCIUM 20 MG PO TABS: 20.0000 mg | ORAL_TABLET | Freq: Every day | ORAL | 0 refills | Status: AC

## 2022-08-21 MED ORDER — EPINEPHRINE 0.3 MG/0.3ML IJ SOAJ: 0.3000 mg | INTRAMUSCULAR | 0 refills | Status: AC | PRN

## 2022-08-21 MED ORDER — BUPROPION HCL ER (XL) 150 MG PO TB24: 150.0000 mg | ORAL_TABLET | Freq: Every day | ORAL | 3 refills | Status: AC

## 2022-08-21 NOTE — Telephone Encounter (Signed)
Called Pt regarding multiple rx refill requests. Received fax from [facility] Asking Dr. Pamelia Hoit to refill atorvastatin, bupropion, procto-med rectal cream, and epinephrine injection. Pt states she is having difficulty finding a PCP as "everyone in [place] is retiring". Pt spoke at length about her past experiences with surgeons and the time that her granddaughter spent in the NICU. Difficult to redirect Pt. Pt states she is OK with coming to [facility] for PCP. Advised Pt she can book a new pt appt via mychart. MD verbalized willingness to refills rxs once while she searches for a PCP. Advised Pt this will be the last time they are filled by Dr. Pamelia Hoit as she has been searching for PCP since April 2024. Pt asks for specific gynecologist that Dr. Pamelia Hoit recommended at last visit. Will ask MD when he is back in office. Pt verbalized understanding.

## 2022-08-24 ENCOUNTER — Telehealth: Payer: Self-pay

## 2022-08-24 NOTE — Telephone Encounter (Signed)
Attempted to call pt as she called and LVM she was returning my call; however, I have not placed a call to this pt. LVM for call back.

## 2022-08-27 ENCOUNTER — Encounter: Payer: Self-pay | Admitting: Hematology

## 2022-09-04 ENCOUNTER — Encounter: Payer: Self-pay | Admitting: Hematology

## 2022-09-26 ENCOUNTER — Ambulatory Visit (INDEPENDENT_AMBULATORY_CARE_PROVIDER_SITE_OTHER): Payer: BC Managed Care – PPO | Admitting: Diagnostic Neuroimaging

## 2022-09-26 ENCOUNTER — Encounter: Payer: Self-pay | Admitting: Diagnostic Neuroimaging

## 2022-09-26 VITALS — BP 121/62 | HR 73 | Ht 63.0 in | Wt 102.0 lb

## 2022-09-26 DIAGNOSIS — R55 Syncope and collapse: Secondary | ICD-10-CM

## 2022-09-26 DIAGNOSIS — R404 Transient alteration of awareness: Secondary | ICD-10-CM | POA: Diagnosis not present

## 2022-09-26 NOTE — Progress Notes (Signed)
[facility]  PATIENT: Sally Smith DOB: 1960-03-06  REFERRING CLINICIAN: Germain Osgood* HISTORY FROM: patient REASON FOR VISIT: new consult   HISTORICAL  CHIEF COMPLAINT:  Chief Complaint  Patient presents with   New Patient (Initial Visit)    Rm 7, here with husband Dimas Aguas  Pt is her for syncope. Pt states that on 7/14  she was at church had an episode where she started feeling hot, started sweating, feeling nauseous and she stood up and then fainted.  It has not happened again since.     HISTORY OF PRESENT ILLNESS:   63 year old female here for evaluation of syncope.  History of breast cancer treated in April 2018.  Patient was at church on 08/26/22 standing up when she felt dizzy, lightheaded, hot and nauseated.  She went to excuse herself but had more lightheadedness and eventually passed out.  Her husband caught her so she did not fall down.  EMS came to scene and vital signs of blood sugar were unremarkable.  She was taken to [facility] emergency room.  CT, EKG, lab work were unremarkable.  Patient was recommended to follow-up with PCP and neurology evaluation.   REVIEW OF SYSTEMS: Full 14 system review of systems performed and negative with exception of: as per HPI  ALLERGIES: Allergies  Allergen Reactions   Bee Venom Anaphylaxis   Zometa [Zoledronic Acid] Itching    Patient developed a low grade itchy/flushed reaction to zometa 07/12/21- see notes.   Azithromycin Other (See Comments)   Erythromycin Base Nausea And Vomiting   Latex Itching    welps   Sulfa Antibiotics Hives and Swelling   Contrast Media [Iodinated Contrast Media] Rash   Wound Dressing Adhesive Itching and Rash    HOME MEDICATIONS: Outpatient Medications Prior to Visit  Medication Sig Dispense Refill   aspirin EC 81 MG tablet Take 81 mg by mouth daily. Swallow whole.     atorvastatin (LIPITOR) 20 MG tablet Take 1 tablet (20 mg total) by mouth daily. 30 tablet 0    buPROPion (WELLBUTRIN XL) 150 MG 24 hr tablet Take 1 tablet (150 mg total) by mouth daily. 90 tablet 3   Calcium Carb-Cholecalciferol (CALCIUM-VITAMIN D) 500-200 MG-UNIT tablet Take 1 tablet by mouth daily.      cholecalciferol (VITAMIN D) 1000 units tablet Take 1,000 Units by mouth daily.     docusate sodium (COLACE) 100 MG capsule Take 100 mg by mouth daily.     EPINEPHrine 0.3 mg/0.3 mL IJ SOAJ injection Inject 0.3 mg into the muscle as needed for anaphylaxis. 1 each 0   fluticasone (FLONASE) 50 MCG/ACT nasal spray fluticasone propionate 50 mcg/actuation nasal spray,suspension  Spray 2 sprays every day by intranasal route as directed.     hyaluronate sodium (RADIAPLEXRX) GEL APPLY TOPICALLY ONCE AS NEEDED FOR UP TO 1 DOSE 170 g 0   ibuprofen (ADVIL,MOTRIN) 200 MG tablet Take 400 mg by mouth every 6 (six) hours as needed for fever, headache, mild pain, moderate pain or cramping.     latanoprost (XALATAN) 0.005 % ophthalmic solution Place 1 drop into both eyes at bedtime.     letrozole (FEMARA) 2.5 MG tablet Take 1 tablet (2.5 mg total) by mouth daily. 90 tablet 3   loratadine (CLARITIN) 5 MG chewable tablet Chew 5 mg by mouth daily.     LORazepam (ATIVAN) 1 MG tablet Take 1 tablet (1 mg total) by mouth 3 (three) times daily as needed for anxiety. 30 tablet 3  Multiple Vitamin (STRESS FORMULA PO) Take 1 tablet by mouth daily as needed (stress).     ondansetron (ZOFRAN-ODT) 4 MG disintegrating tablet DISSOLVE 1 TABLET IN MOUTH EVERY 8 HOURS AS NEEDED FOR NAUSEA 30 tablet 1   potassium chloride (KLOR-CON) 20 MEQ packet Take 20 mEq by mouth daily. 20 packet 0   PROCTO-MED HC 2.5 % rectal cream Place 1 Application rectally daily as needed for hemorrhoids. 30 g 0   Saccharomyces boulardii (DIGESTIVE PROBIOTIC PO) Take 1 capsule by mouth daily.     timolol (TIMOPTIC) 0.5 % ophthalmic solution Place 1 drop into both eyes daily.     zoledronic acid (RECLAST) 5 MG/100ML SOLN injection Inject 5 mg into  the vein every 6 (six) months. EVERY (due in May/Nov)     zolpidem (AMBIEN) 10 MG tablet Take 1 tablet (10 mg total) by mouth at bedtime as needed. 30 tablet 3   No facility-administered medications prior to visit.    PAST MEDICAL HISTORY: Past Medical History:  Diagnosis Date   ADHD (attention deficit hyperactivity disorder)    Allergy    seasonal allergies   Anxiety    on meds   Breast cancer (HCC) 07/03/2016   Cancer of left breast (HCC)    [facility] genetic testing negative on 05/31/16   Carotid artery disease (HCC)    Complication of anesthesia    Vagals easily to pain   Diverticulitis    Genetic testing 06/18/2016   Ms. Maltz underwent genetic counseling and testing for hereditary cancer syndromes on 05/31/2016. Her results were negative for mutations in all 46 genes analyzed by [facility]'s 46-gene Common Hereditary Cancers Panel. Genes analyzed include: APC, ATM, AXIN2, BARD1, BMPR1A, BRCA1, BRCA2, BRIP1, CDH1, CDKN2A, CHEK2, CTNNA1, DICER1, EPCAM, GREM1, HOXB13, KIT, MEN1, MLH1, MSH2, MSH3, MSH6, MUTYH, NBN,   Glaucoma    on meds   Hyperlipidemia    on meds   Personal history of radiation therapy 2018   Left Breast Cancer   Pneumonia    PONV (postoperative nausea and vomiting)    Skin cancer    left breast     PAST SURGICAL HISTORY: Past Surgical History:  Procedure Laterality Date   BREAST EXCISIONAL BIOPSY Left 04/2020   BREAST LUMPECTOMY Left 07/03/2016   BREAST LUMPECTOMY WITH RADIOACTIVE SEED AND SENTINEL LYMPH NODE BIOPSY Left 07/04/2016   Procedure: LEFT BREAST LUMPECTOMY WITH RADIOACTIVE SEED X 2 AND SENTINEL LYMPH NODE BIOPSY;  Surgeon: Almond Lint, MD;  Location: [facility];  Service: General;  Laterality: Left;   EYE SURGERY Bilateral 11/25/2020   cataract surgery   LATISSIMUS FLAP TO BREAST N/A 03/15/2021   Procedure: Latissimus muscle flap for chest wall reconstruction;  Surgeon: Peggye Form, DO;  Location: [facility] OR;  Service:  Plastics;  Laterality: N/A;  4 hours   MASS EXCISION Left 03/15/2021   Procedure: CHEST WALL TUMOR EXCISION;  Surgeon: Corliss Skains, MD;  Location: [facility] OR;  Service: Thoracic;  Laterality: Left;   SKIN BIOPSY Left 05/17/2020   Procedure: INCISIONAL BIOPSY LEFT  CHEST WALL MASS;  Surgeon: Almond Lint, MD;  Location: [facility];  Service: General;  Laterality: Left;   surgery on right leg Right    melonoma   TONSILLECTOMY     WISDOM TOOTH EXTRACTION  1985    FAMILY HISTORY: Family History  Problem Relation Age of Onset   Breast cancer Mother 83       Contralateral breast cancer a few years later  that metastasized.   Colon cancer Neg Hx    Ovarian cancer Neg Hx    Endometrial cancer Neg Hx    Pancreatic cancer Neg Hx    Prostate cancer Neg Hx     SOCIAL HISTORY: Social History   Socioeconomic History   Marital status: Married    Spouse name: Not on file   Number of children: Not on file   Years of education: Not on file   Highest education level: Not on file  Occupational History   Not on file  Tobacco Use   Smoking status: Never   Smokeless tobacco: Never  Vaping Use   Vaping status: Never Used  Substance and Sexual Activity   Alcohol use: Yes    Comment: rare   Drug use: No    Comment: uses CBD oil from [facility]   Sexual activity: Yes    Birth control/protection: Surgical    Comment: husband had vasectomy  Other Topics Concern   Not on file  Social History Narrative   Not on file   Social Determinants of Health   Financial Resource Strain: Not on file  Food Insecurity: Not on file  Transportation Needs: Not on file  Physical Activity: Not on file  Stress: Not on file  Social Connections: Not on file  Intimate Partner Violence: Not on file     PHYSICAL EXAM  GENERAL EXAM/CONSTITUTIONAL: Vitals:  Vitals:   09/26/22 1030  BP: 121/62  Pulse: 73  Weight: 102 lb (46.3 kg)  Height: 5\' 3"  (1.6 m)   Body mass index is  18.07 kg/m. Wt Readings from Last 3 Encounters:  09/26/22 102 lb (46.3 kg)  06/25/22 102 lb 14.4 oz (46.7 kg)  06/14/22 104 lb (47.2 kg)   Patient is in no distress; well developed, nourished and groomed; neck is supple  CARDIOVASCULAR: Examination of carotid arteries is normal; no carotid bruits Regular rate and rhythm, no murmurs Examination of peripheral vascular system by observation and palpation is normal  EYES: Ophthalmoscopic exam of optic discs and posterior segments is normal; no papilledema or hemorrhages No results found.  MUSCULOSKELETAL: Gait, strength, tone, movements noted in Neurologic exam below  NEUROLOGIC: MENTAL STATUS:      No data to display         awake, alert, oriented to person, place and time recent and remote memory intact normal attention and concentration language fluent, comprehension intact, naming intact fund of knowledge appropriate  CRANIAL NERVE:  2nd - no papilledema on fundoscopic exam 2nd, 3rd, 4th, 6th - pupils equal and reactive to light, visual fields full to confrontation, extraocular muscles intact, no nystagmus 5th - facial sensation symmetric 7th - facial strength symmetric 8th - hearing intact 9th - palate elevates symmetrically, uvula midline 11th - shoulder shrug symmetric 12th - tongue protrusion midline  MOTOR:  normal bulk and tone, full strength in the BUE, BLE  SENSORY:  normal and symmetric to light touch, pinprick, temperature, vibration  COORDINATION:  finger-nose-finger, fine finger movements normal  REFLEXES:  deep tendon reflexes present and symmetric  GAIT/STATION:  narrow based gait    DIAGNOSTIC DATA (LABS, IMAGING, TESTING) - I reviewed patient records, labs, notes, testing and imaging myself where available.  Lab Results  Component Value Date   WBC 5.7 06/25/2022   HGB 12.6 06/25/2022   HCT 37.0 06/25/2022   MCV 103.9 (H) 06/25/2022   PLT 164 06/25/2022      Component Value  Date/Time   NA 139  06/25/2022 1109   NA 136 01/04/2017 1016   K 3.9 06/25/2022 1109   K 3.6 01/04/2017 1016   CL 105 06/25/2022 1109   CO2 28 06/25/2022 1109   CO2 26 01/04/2017 1016   GLUCOSE 94 06/25/2022 1109   GLUCOSE 85 01/04/2017 1016   BUN 8 06/25/2022 1109   BUN 7.6 01/04/2017 1016   CREATININE 0.73 06/25/2022 1109   CREATININE 0.8 01/04/2017 1016   CALCIUM 9.1 06/25/2022 1109   CALCIUM 9.6 01/04/2017 1016   PROT 7.9 06/25/2022 1109   PROT 7.8 01/04/2017 1016   ALBUMIN 4.2 06/25/2022 1109   ALBUMIN 4.1 01/04/2017 1016   AST 21 06/25/2022 1109   AST 22 01/04/2017 1016   ALT 15 06/25/2022 1109   ALT 30 01/04/2017 1016   ALKPHOS 59 06/25/2022 1109   ALKPHOS 87 01/04/2017 1016   BILITOT 0.6 06/25/2022 1109   BILITOT 0.40 01/04/2017 1016   GFRNONAA >60 06/25/2022 1109   GFRAA >60 06/01/2019 1249   No results found for: "CHOL", "HDL", "LDLCALC", "LDLDIRECT", "TRIG", "CHOLHDL" No results found for: "HGBA1C" Lab Results  Component Value Date   VITAMINB12 884 06/01/2019   No results found for: "TSH"  06/14/22  Right Carotid: There is no evidence of stenosis in the right ICA.   Left Carotid: Velocities in the left ICA are consistent with a 40-59%  stenosis.   Vertebrals: Bilateral vertebral arteries demonstrate antegrade flow.  Subclavians: Normal flow hemodynamics were seen in bilateral subclavian               arteries.     ASSESSMENT AND PLAN  62 y.o. year old female here with:   Dx:  1. Syncope, unspecified syncope type   2. Transient alteration of awareness     PLAN:  SYNCOPE (unprovoked vs provoked by nausea, standing, hot flash / hormone symptom)  - check MRI brain, EEG  - follow up with PCP / cardiology evaluation   - Please maintain precautions until cleared by cardiology. Caution with activities where a loss of awareness could harm you or someone else: swimming alone, driving, operating motorized vehicles (cars, ATVs, motocycles, etc),  lawnmowers, power tools or firearms, standing at heights, such as rooftops, ladders, hot objects such as stoves, heaters, open fires. Wear a helmet when riding a bicycle, scooter, skateboard, etc. and avoid areas of traffic. Set your water heater to 120 degrees or less.  Orders Placed This Encounter  Procedures   MR BRAIN W WO CONTRAST   EEG adult   Return for pending if symptoms worsen or fail to improve, pending test results.    Suanne Marker, MD 09/26/2022, 11:08 AM Certified in Neurology, Neurophysiology and Neuroimaging  [facility] [address] [place], [place] [address] [phone]

## 2022-09-26 NOTE — Patient Instructions (Signed)
  SYNCOPE (unprovoked vs provoked by hot flash / hormone symptom)  - check MRI brain, EEG  - follow up with cardiology evaluation   - Please maintain precautions until cleared by cardiology. Caution with activities where a loss of awareness could harm you or someone else: swimming alone, driving, operating motorized vehicles (cars, ATVs, motocycles, etc), lawnmowers, power tools or firearms, standing at heights, such as rooftops, ladders, hot objects such as stoves, heaters, open fires. Wear a helmet when riding a bicycle, scooter, skateboard, etc. and avoid areas of traffic. Set your water heater to 120 degrees or less.

## 2022-10-04 ENCOUNTER — Other Ambulatory Visit: Payer: BC Managed Care – PPO | Admitting: *Deleted

## 2022-10-08 ENCOUNTER — Other Ambulatory Visit: Payer: BC Managed Care – PPO | Admitting: *Deleted

## 2022-10-10 ENCOUNTER — Ambulatory Visit (INDEPENDENT_AMBULATORY_CARE_PROVIDER_SITE_OTHER): Payer: BC Managed Care – PPO

## 2022-10-10 DIAGNOSIS — R404 Transient alteration of awareness: Secondary | ICD-10-CM | POA: Diagnosis not present

## 2022-10-10 DIAGNOSIS — R55 Syncope and collapse: Secondary | ICD-10-CM

## 2022-10-10 MED ORDER — GADOBENATE DIMEGLUMINE 529 MG/ML IV SOLN
10.0000 mL | Freq: Once | INTRAVENOUS | Status: AC | PRN
  Administered 2022-10-10: 10 mL via INTRAVENOUS

## 2022-10-11 ENCOUNTER — Telehealth: Payer: Self-pay | Admitting: Anesthesiology

## 2022-10-11 ENCOUNTER — Ambulatory Visit (INDEPENDENT_AMBULATORY_CARE_PROVIDER_SITE_OTHER): Payer: BC Managed Care – PPO | Admitting: Diagnostic Neuroimaging

## 2022-10-11 DIAGNOSIS — R55 Syncope and collapse: Secondary | ICD-10-CM | POA: Diagnosis not present

## 2022-10-11 DIAGNOSIS — R404 Transient alteration of awareness: Secondary | ICD-10-CM

## 2022-10-11 NOTE — Telephone Encounter (Signed)
-----   Message from Suanne Marker sent at 10/10/2022  6:43 PM EDT ----- Unremarkable imaging results. Please call patient. Continue current plan. -VRP

## 2022-10-18 NOTE — Telephone Encounter (Signed)
Called the patient and there was no answer. Was able to leave a detailed message advising the patient of the normal MRI finding instructing the pt to call back or send a mychart message with questions

## 2022-10-19 ENCOUNTER — Other Ambulatory Visit: Payer: Self-pay | Admitting: Hematology

## 2022-10-19 DIAGNOSIS — Z170 Estrogen receptor positive status [ER+]: Secondary | ICD-10-CM

## 2022-10-19 DIAGNOSIS — C50212 Malignant neoplasm of upper-inner quadrant of left female breast: Secondary | ICD-10-CM

## 2022-10-30 NOTE — Procedures (Signed)
   [facility]  EEG (ELECTROENCEPHALOGRAM) REPORT   STUDY DATE: 10/11/22 PATIENT NAME: Sally Smith DOB: [birth year] MRN: [id number]  ORDERING CLINICIAN: Joycelyn Schmid, MD   TECHNOLOGIST: Marcheta Grammes TECHNIQUE: Electroencephalogram was recorded utilizing standard 10-20 system of lead placement and reformatted into average and bipolar montages.  RECORDING TIME: 25 minutes ACTIVATION: hyperventilation and photic stimulation  CLINICAL INFORMATION: 62 year old female with syncope.  FINDINGS: Posterior dominant background rhythms, which attenuate with eye opening, ranging 11-12 hertz and 20-30 microvolts.  Rare right greater than left temporal and anterior temporal sharply contoured waves noted over F7, T7, F8 and T8.  No definite phase reversals noted.  Patient recorded in the awake and drowsy state. EKG channel shows regular rhythm of 60-65 beats per minute.   IMPRESSION:   EEG in the awake and drowsy states demonstrating: -Rare right greater than left temporal and anterior temporal sharply contoured waves, without definite phase reversals, are potentially epileptiform but not clear-cut. -No electrographic seizures are recorded.    INTERPRETING PHYSICIAN:  Suanne Marker, MD Certified in Neurology, Neurophysiology and Neuroimaging  [facility] [address] [place], [place] [address] [phone]

## 2022-12-03 ENCOUNTER — Other Ambulatory Visit: Payer: Self-pay | Admitting: Hematology and Oncology

## 2022-12-14 ENCOUNTER — Encounter: Payer: Self-pay | Admitting: Hematology and Oncology

## 2022-12-24 ENCOUNTER — Ambulatory Visit
Admission: RE | Admit: 2022-12-24 | Discharge: 2022-12-24 | Disposition: A | Discharge status: Home / Self Care | Payer: BC Managed Care – PPO | Source: Ambulatory Visit | Attending: Hematology and Oncology | Admitting: Hematology and Oncology

## 2022-12-24 DIAGNOSIS — Z170 Estrogen receptor positive status [ER+]: Secondary | ICD-10-CM

## 2022-12-24 MED ORDER — GADOPICLENOL 0.5 MMOL/ML IV SOLN
5.0000 mL | Freq: Once | INTRAVENOUS | Status: AC | PRN
  Administered 2022-12-24: 5 mL via INTRAVENOUS

## 2022-12-25 ENCOUNTER — Telehealth: Payer: Self-pay

## 2022-12-25 NOTE — Telephone Encounter (Signed)
-----   Message from Noreene Filbert sent at 12/24/2022  3:58 PM EST ----- MRI is negative for concerns for malignancy.  Please give patient the good results. ----- Message ----- From: Interface, Rad Results In Sent: 12/24/2022  12:38 PM EST To: Serena Croissant, MD

## 2022-12-25 NOTE — Telephone Encounter (Signed)
Called and left a message MRI is negative for concerns of malignancy. Ask her to call the office back for questions.

## 2022-12-26 ENCOUNTER — Other Ambulatory Visit: Payer: Self-pay | Admitting: *Deleted

## 2022-12-26 DIAGNOSIS — C50212 Malignant neoplasm of upper-inner quadrant of left female breast: Secondary | ICD-10-CM

## 2022-12-27 ENCOUNTER — Inpatient Hospital Stay: Payer: BC Managed Care – PPO

## 2022-12-27 ENCOUNTER — Inpatient Hospital Stay: Payer: BC Managed Care – PPO | Admitting: Hematology and Oncology

## 2022-12-27 ENCOUNTER — Inpatient Hospital Stay: Payer: BC Managed Care – PPO | Attending: Hematology and Oncology

## 2022-12-27 ENCOUNTER — Encounter: Payer: Self-pay | Admitting: Hematology

## 2022-12-27 VITALS — BP 115/55 | HR 73 | Temp 97.9°F | Resp 18 | Ht 63.0 in | Wt 104.7 lb

## 2022-12-27 DIAGNOSIS — M8580 Other specified disorders of bone density and structure, unspecified site: Secondary | ICD-10-CM | POA: Insufficient documentation

## 2022-12-27 DIAGNOSIS — Z1721 Progesterone receptor positive status: Secondary | ICD-10-CM | POA: Insufficient documentation

## 2022-12-27 DIAGNOSIS — Z170 Estrogen receptor positive status [ER+]: Secondary | ICD-10-CM | POA: Diagnosis not present

## 2022-12-27 DIAGNOSIS — M25552 Pain in left hip: Secondary | ICD-10-CM | POA: Diagnosis not present

## 2022-12-27 DIAGNOSIS — Z79811 Long term (current) use of aromatase inhibitors: Secondary | ICD-10-CM | POA: Insufficient documentation

## 2022-12-27 DIAGNOSIS — C50212 Malignant neoplasm of upper-inner quadrant of left female breast: Secondary | ICD-10-CM | POA: Diagnosis not present

## 2022-12-27 LAB — CBC WITH DIFFERENTIAL (CANCER CENTER ONLY)
Abs Immature Granulocytes: 0.01 10*3/uL (ref 0.00–0.07)
Basophils Absolute: 0 10*3/uL (ref 0.0–0.1)
Basophils Relative: 1 %
Eosinophils Absolute: 0.1 10*3/uL (ref 0.0–0.5)
Eosinophils Relative: 2 %
HCT: 40.4 % (ref 36.0–46.0)
Hemoglobin: 14 g/dL (ref 12.0–15.0)
Immature Granulocytes: 0 %
Lymphocytes Relative: 28 %
Lymphs Abs: 1.3 10*3/uL (ref 0.7–4.0)
MCH: 35.6 pg — ABNORMAL HIGH (ref 26.0–34.0)
MCHC: 34.7 g/dL (ref 30.0–36.0)
MCV: 102.8 fL — ABNORMAL HIGH (ref 80.0–100.0)
Monocytes Absolute: 0.4 10*3/uL (ref 0.1–1.0)
Monocytes Relative: 8 %
Neutro Abs: 2.7 10*3/uL (ref 1.7–7.7)
Neutrophils Relative %: 61 %
Platelet Count: 186 10*3/uL (ref 150–400)
RBC: 3.93 MIL/uL (ref 3.87–5.11)
RDW: 12 % (ref 11.5–15.5)
WBC Count: 4.5 10*3/uL (ref 4.0–10.5)
nRBC: 0 % (ref 0.0–0.2)

## 2022-12-27 LAB — CMP (CANCER CENTER ONLY)
ALT: 22 U/L (ref 0–44)
AST: 28 U/L (ref 15–41)
Albumin: 4.4 g/dL (ref 3.5–5.0)
Alkaline Phosphatase: 64 U/L (ref 38–126)
Anion gap: 7 (ref 5–15)
BUN: 13 mg/dL (ref 8–23)
CO2: 28 mmol/L (ref 22–32)
Calcium: 9.7 mg/dL (ref 8.9–10.3)
Chloride: 105 mmol/L (ref 98–111)
Creatinine: 0.84 mg/dL (ref 0.44–1.00)
GFR, Estimated: >60 mL/min (ref >60)
Glucose, Bld: 97 mg/dL (ref 70–99)
Potassium: 4.1 mmol/L (ref 3.5–5.1)
Sodium: 140 mmol/L (ref 135–145)
Total Bilirubin: 0.4 mg/dL (ref <1.2)
Total Protein: 8.4 g/dL — ABNORMAL HIGH (ref 6.5–8.1)

## 2022-12-27 MED ORDER — SODIUM CHLORIDE 0.9 % IV SOLN: INTRAVENOUS | Status: DC

## 2022-12-27 MED ORDER — DIPHENHYDRAMINE HCL 50 MG/ML IJ SOLN
25.0000 mg | Freq: Once | INTRAMUSCULAR | Status: AC
Start: 2022-12-27 — End: 2022-12-27
  Administered 2022-12-27: 25 mg via INTRAVENOUS
  Filled 2022-12-27: qty 1

## 2022-12-27 MED ORDER — ZOLEDRONIC ACID 4 MG/100ML IV SOLN
4.0000 mg | Freq: Once | INTRAVENOUS | Status: AC
Start: 2022-12-27 — End: 2022-12-27
  Administered 2022-12-27: 4 mg via INTRAVENOUS
  Filled 2022-12-27: qty 100

## 2022-12-27 NOTE — Assessment & Plan Note (Signed)
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 Mitzi Hansen   Current treatment: Letrozole 2.5 mg started August 2018 Letrozole toxicities: Hair thinning Joint stiffness: Using CBD oil which has helped her significantly. Vaginal dryness: Patient is planning on starting a nonhormonal medication for the vaginal dryness.  We did briefly discussed about Drue Stager touch as well as Replens.   Breast cancer surveillance: 1.  Breast exam 06/25/2022: Benign 2. Mammogram 07/12/2022 Right breast: Benign breast density category C 3.  Breast MRI 12/24/2022: Benign breast density category C  Brain MRI 10/10/2022: Benign   Bone density 01/02/2021: T score -2.1: Osteopenia: Zometa infusion: Caused   itching We will need to premedicate her with Benadryl before the infusion.   Chest wall wound: Follows with Dr. Ulice Bold, she underwent latissimus dorsi flap closure.  Although the wound is closed as she has profound pain in the left chest wall.     Return to clinic in 1 year for follow-up

## 2022-12-27 NOTE — Patient Instructions (Signed)

## 2022-12-27 NOTE — Progress Notes (Signed)
Patient Care Team: Tacy Learn, FNP as PCP - General (Family Medicine) Almond Lint, MD as Consulting Physician (General Surgery) Dorothy Puffer, MD as Consulting Physician (Radiation Oncology) Axel Filler Larna Daughters, NP as Nurse Practitioner (Hematology and Oncology) Serena Croissant, MD as Consulting Physician (Hematology and Oncology)  DIAGNOSIS:  Encounter Diagnosis  Name Primary?   Malignant neoplasm of upper-inner quadrant of left breast in female, estrogen receptor positive (HCC) Yes    SUMMARY OF ONCOLOGIC HISTORY: Oncology History Overview Note  Cancer Staging Malignant neoplasm of upper-inner quadrant of left breast in female, estrogen receptor positive (HCC) Staging form: Breast, AJCC 8th Edition - Clinical stage from 05/22/2016: Stage IA (cT1a, cN0, cM0, G2, ER: Positive, PR: Positive, HER2: Negative) - Signed by Malachy Mood, MD on 05/29/2016     Malignant neoplasm of upper-inner quadrant of left breast in female, estrogen receptor positive (HCC)  05/10/2016 Mammogram   Bilateral screening mammogram on 05/10/16 showed a possible distortion with calcifications in the left breast.   05/16/2016 Mammogram   Diagnostic mammogram and US showed Persistent distortion in the upper inner quadrant of the left breast possibly correlating with the sonographic area of acoustic shadowing, measuring about 6mm. [place] of left axilla (-)   05/22/2016 Receptors her2   ER 100%, PR 70%+, Ki67 5%   05/22/2016 Initial Biopsy   Diagnosis Breast, left, needle core biopsy, upper inner - INVASIVE AND IN SITU LOBULAR CARCINOMA, G1-2   05/22/2016 Initial Diagnosis   Malignant neoplasm of upper-inner quadrant of left breast in female, estrogen receptor positive (HCC)   05/31/2016 Genetic Testing   Genetic counseling and testing for hereditary cancer syndromes performed on 05/31/2016. Results are negative for pathogenic mutations in 46 genes analyzed by [facility]'s Common Hereditary Cancers Panel. Results  are dated 06/11/2016. Genes analyzed include: APC, ATM, AXIN2, BARD1, BMPR1A, BRCA1, BRCA2, BRIP1, CDH1, CDKN2A, CHEK2, CTNNA1, DICER1, EPCAM, GREM1, HOXB13, KIT, MEN1, MLH1, MSH2, MSH3, MSH6, MUTYH, NBN, NF1, NTHL1, PALB2, PDGFRA, PMS2, POLD1, POLE, PTEN, RAD50, RAD51C, RAD51D, SDHA, SDHB, SDHC, SDHD, SMAD4, SMARCA4, STK11, TP53, TSC1, TSC2, and VHL.    07/01/2016 Oncotype testing   Oncotype testing showed reoccurance score of 11. With 10 year risk of distant reoccurrence 7% with tamoxifen alone   07/04/2016 Surgery   Left breast lumpectomy with radioactive seed x2 and sentinel lymph node biopsy by Dr. Donell Beers   08/08/2016 - 09/24/2016 Radiation Therapy   Patient underwent radiation with Dr.Moody   09/2016 -  Anti-estrogen oral therapy   Letrozole 2.5 mg daily starting 09/2016   05/13/2017 Mammogram   Bilateral diagnostic mammogram and left ultrasonography  IMPRESSION:  1. No suspicious mammographic or sonographic abnormalities in the areas of patient's LEFT breast pain.  2. No mammographic evidence of breast malignancy.    10/14/2017 Imaging   10/14/2017 Breast MRI IMPRESSION: No MRI evidence of malignancy in either breast. Left lumpectomy changes. No adenopathy.     CHIEF COMPLIANT:   HISTORY OF PRESENT ILLNESS:    She reports that the pain has been persistent despite chiropractic treatment. The patient also mentions a previous issue with a sciatic nerve, which has since improved. She has not sought further medical attention for the hip pain, attributing it to possible arthritis.  The patient also reports sensitivity in the area of a previous breast reconstruction. She describes the area as not painful, but sensitive to pressure. The patient manages this by wearing loose clothing and being mindful of the area.  The patient also mentions having had COVID-19 in March,  but has since recovered. She has been taking an anti-estrogen pill for six years, which she believes may be contributing  to joint aches. She also receives Zometa infusions every six months for bone density, with the most recent infusion due today.         ALLERGIES:  is allergic to bee venom, zometa [zoledronic acid], azithromycin, erythromycin base, latex, sulfa antibiotics, contrast media [iodinated contrast media], and wound dressing adhesive.  MEDICATIONS:  Current Outpatient Medications  Medication Sig Dispense Refill   aspirin EC 81 MG tablet Take 81 mg by mouth daily. Swallow whole.     atorvastatin (LIPITOR) 20 MG tablet Take 1 tablet (20 mg total) by mouth daily. 30 tablet 0   buPROPion (WELLBUTRIN XL) 150 MG 24 hr tablet Take 1 tablet (150 mg total) by mouth daily. 90 tablet 3   Calcium Carb-Cholecalciferol (CALCIUM-VITAMIN D) 500-200 MG-UNIT tablet Take 1 tablet by mouth daily.      cholecalciferol (VITAMIN D) 1000 units tablet Take 1,000 Units by mouth daily.     docusate sodium (COLACE) 100 MG capsule Take 100 mg by mouth daily.     EPINEPHrine 0.3 mg/0.3 mL IJ SOAJ injection Inject 0.3 mg into the muscle as needed for anaphylaxis. 1 each 0   fluticasone (FLONASE) 50 MCG/ACT nasal spray fluticasone propionate 50 mcg/actuation nasal spray,suspension  Spray 2 sprays every day by intranasal route as directed.     hyaluronate sodium (RADIAPLEXRX) GEL APPLY TOPICALLY ONCE AS NEEDED FOR UP TO 1 DOSE 170 g 0   ibuprofen (ADVIL,MOTRIN) 200 MG tablet Take 400 mg by mouth every 6 (six) hours as needed for fever, headache, mild pain, moderate pain or cramping.     latanoprost (XALATAN) 0.005 % ophthalmic solution Place 1 drop into both eyes at bedtime.     letrozole (FEMARA) 2.5 MG tablet TAKE 1 TABLET BY MOUTH DAILY 90 tablet 0   loratadine (CLARITIN) 5 MG chewable tablet Chew 5 mg by mouth daily.     LORazepam (ATIVAN) 1 MG tablet Take 1 tablet (1 mg total) by mouth 3 (three) times daily as needed for anxiety. 30 tablet 3   Multiple Vitamin (STRESS FORMULA PO) Take 1 tablet by mouth daily as needed  (stress).     ondansetron (ZOFRAN-ODT) 4 MG disintegrating tablet DISSOLVE 1 TABLET IN MOUTH EVERY 8 HOURS AS NEEDED FOR NAUSEA 30 tablet 1   potassium chloride (KLOR-CON) 20 MEQ packet Take 20 mEq by mouth daily. 20 packet 0   PROCTO-MED HC 2.5 % rectal cream Place 1 Application rectally daily as needed for hemorrhoids. 30 g 0   Saccharomyces boulardii (DIGESTIVE PROBIOTIC PO) Take 1 capsule by mouth daily.     timolol (TIMOPTIC) 0.5 % ophthalmic solution Place 1 drop into both eyes daily.     zoledronic acid (RECLAST) 5 MG/100ML SOLN injection Inject 5 mg into the vein every 6 (six) months. EVERY (due in May/Nov)     zolpidem (AMBIEN) 10 MG tablet Take 1 tablet (10 mg total) by mouth at bedtime as needed. 30 tablet 3   No current facility-administered medications for this visit.   Facility-Administered Medications Ordered in Other Visits  Medication Dose Route Frequency Provider Last Rate Last Admin   0.9 %  sodium chloride infusion   Intravenous Continuous Serena Croissant, MD       diphenhydrAMINE (BENADRYL) injection 25 mg  25 mg Intravenous Once Serena Croissant, MD       Zoledronic Acid (ZOMETA) IVPB 4 mg  4 mg Intravenous Once Serena Croissant, MD        PHYSICAL EXAMINATION: ECOG PERFORMANCE STATUS: 1 - Symptomatic but completely ambulatory  Vitals:   12/27/22 1129  BP: (!) 115/55  Pulse: 73  Resp: 18  Temp: 97.9 F (36.6 C)  SpO2: 99%   Filed Weights   12/27/22 1129  Weight: 104 lb 11.2 oz (47.5 kg)      LABORATORY DATA:  I have reviewed the data as listed    Latest Ref Rng & Units 12/27/2022   10:59 AM 06/25/2022   11:09 AM 01/22/2022   11:51 AM  CMP  Glucose 70 - 99 mg/dL 97  94  97   BUN 8 - 23 mg/dL 13  8  7    Creatinine 0.44 - 1.00 mg/dL 1.61  0.96  0.45   Sodium 135 - 145 mmol/L 140  139  143   Potassium 3.5 - 5.1 mmol/L 4.1  3.9  4.0   Chloride 98 - 111 mmol/L 105  105  111   CO2 22 - 32 mmol/L 28  28  26    Calcium 8.9 - 10.3 mg/dL 9.7  9.1  9.5   Total  Protein 6.5 - 8.1 g/dL 8.4  7.9  7.2   Total Bilirubin <1.2 mg/dL 0.4  0.6  0.3   Alkaline Phos 38 - 126 U/L 64  59  53   AST 15 - 41 U/L 28  21  20    ALT 0 - 44 U/L 22  15  15      Lab Results  Component Value Date   WBC 4.5 12/27/2022   HGB 14.0 12/27/2022   HCT 40.4 12/27/2022   MCV 102.8 (H) 12/27/2022   PLT 186 12/27/2022   NEUTROABS 2.7 12/27/2022    ASSESSMENT & PLAN:  Malignant neoplasm of upper-inner quadrant of left breast in female, estrogen receptor positive (HCC) 07/04/2016: Left lumpectomy: 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 Mitzi Hansen   Current treatment: Letrozole 2.5 mg started August 2018 Letrozole toxicities: Hair thinning Joint stiffness: Using CBD oil which has helped her significantly. Vaginal dryness: Patient is planning on starting a nonhormonal medication for the vaginal dryness.  We did briefly discussed about Drue Stager touch as well as Replens.   Breast cancer surveillance: 1.  Breast exam 06/25/2022: Benign 2. Mammogram 07/12/2022 Right breast: Benign breast density category C 3.  Breast MRI 12/24/2022: Benign breast density category C  Brain MRI 10/10/2022: Benign   Bone density 01/02/2021: T score -2.1: Osteopenia: Zometa infusion: Caused   itching We will need to premedicate her with Benadryl before the infusion.   Chest wall wound: Follows with Dr. Ulice Bold, she underwent latissimus dorsi flap closure.  Although the wound is closed as she has profound pain in the left chest wall.     Return to clinic in 1 year for follow-up ------------------------------------- Assessment and Plan    Breast Reconstruction Post-Mastectomy Sensitivity and tenderness reported, but no pain. Wound appears to be healed. -Continue to monitor and manage symptoms.  Hip Pain Possible arthritis, currently managed with chiropractic care. -Continue  chiropractic treatment.  Osteopenia Bone density test scheduled for 12/25/2022. Patient has been receiving Zemaira infusions. -Review results of bone density test when available.  Breast Cancer Surveillance Recent MRI (12/24/2022) showed no masses, no issues, no enhancement, no lymph nodes, all clear. Patient is on anti-estrogen pill for six years with  no specific issues, possible joint aches. -Continue anti-estrogen pill. -Continue with biannual mammograms and annual MRIs. Next MRI scheduled for November 2025. -Continue Zemaira infusions every six months, with premedication of Benadryl. Next infusion due today.  Follow-up in six months.          Orders Placed This Encounter  Procedures   MR BREAST BILATERAL W WO CONTRAST INC CAD    Standing Status:   Future    Standing Expiration Date:   12/27/2023    Order Specific Question:   If indicated for the ordered procedure, I authorize the administration of contrast media per Radiology protocol    Answer:   Yes    Order Specific Question:   What is the patient's sedation requirement?    Answer:   No Sedation    Order Specific Question:   Does the patient have a pacemaker or implanted devices?    Answer:   No    Order Specific Question:   Preferred imaging location?    Answer:   [facility] (table limit-550lbs)    Order Specific Question:   Release to patient    Answer:   Immediate   The patient has a good understanding of the overall plan. she agrees with it. she will call with any problems that may develop before the next visit here. Total time spent: 30 mins including face to face time and time spent for planning, charting and co-ordination of care   Tamsen Meek, MD 12/27/22

## 2023-01-18 ENCOUNTER — Other Ambulatory Visit: Payer: BC Managed Care – PPO

## 2023-01-24 ENCOUNTER — Ambulatory Visit
Admission: RE | Admit: 2023-01-24 | Discharge: 2023-01-24 | Disposition: A | Discharge status: Home / Self Care | Payer: BC Managed Care – PPO | Source: Ambulatory Visit | Attending: Hematology and Oncology | Admitting: Hematology and Oncology

## 2023-01-24 DIAGNOSIS — Z170 Estrogen receptor positive status [ER+]: Secondary | ICD-10-CM

## 2023-01-24 DIAGNOSIS — Z780 Asymptomatic menopausal state: Secondary | ICD-10-CM

## 2023-01-25 ENCOUNTER — Telehealth: Payer: Self-pay | Admitting: Hematology and Oncology

## 2023-01-25 NOTE — Telephone Encounter (Signed)
I reviewed the bone density report with the patient that showed a T-score of -2.5 (as it used to be -2.1 in 2022) Patient is currently on Zometa every 6 months. Encouraged her to continue with the same treatment along with calcium vitamin D and weightbearing exercises.

## 2023-03-08 ENCOUNTER — Other Ambulatory Visit: Payer: Self-pay | Admitting: Hematology and Oncology

## 2023-05-07 ENCOUNTER — Other Ambulatory Visit: Payer: Self-pay | Admitting: *Deleted

## 2023-05-07 MED ORDER — LETROZOLE 2.5 MG PO TABS: 2.5000 mg | ORAL_TABLET | Freq: Every day | ORAL | 2 refills | Status: DC

## 2023-05-09 ENCOUNTER — Other Ambulatory Visit: Payer: Self-pay | Admitting: *Deleted

## 2023-05-09 DIAGNOSIS — Z170 Estrogen receptor positive status [ER+]: Secondary | ICD-10-CM

## 2023-05-09 DIAGNOSIS — N952 Postmenopausal atrophic vaginitis: Secondary | ICD-10-CM

## 2023-05-09 NOTE — Progress Notes (Signed)
 Received call from pt with complaint of ongoing vaginal dryness and irritation.  Pt also states she has not been able to have intercourse for 2 years due to severe pain. Per MD pt to try applying coconut oil as well as a referral be placed to pelvic floor PT.  MD also states if coconut oil does not alleviate symptoms, okay for pt to discuss estrogen cream with gynecologist.

## 2023-06-27 ENCOUNTER — Inpatient Hospital Stay: Payer: BC Managed Care – PPO

## 2023-06-27 ENCOUNTER — Inpatient Hospital Stay: Payer: BC Managed Care – PPO | Admitting: Hematology and Oncology

## 2023-06-27 ENCOUNTER — Inpatient Hospital Stay: Payer: BC Managed Care – PPO | Attending: Hematology and Oncology

## 2023-06-27 VITALS — BP 128/74 | HR 77 | Temp 97.1°F | Resp 16 | Wt 98.4 lb

## 2023-06-27 VITALS — BP 103/51 | HR 72 | Resp 16

## 2023-06-27 DIAGNOSIS — C50212 Malignant neoplasm of upper-inner quadrant of left female breast: Secondary | ICD-10-CM | POA: Diagnosis not present

## 2023-06-27 DIAGNOSIS — Z170 Estrogen receptor positive status [ER+]: Secondary | ICD-10-CM | POA: Insufficient documentation

## 2023-06-27 DIAGNOSIS — Z79811 Long term (current) use of aromatase inhibitors: Secondary | ICD-10-CM | POA: Diagnosis not present

## 2023-06-27 DIAGNOSIS — M810 Age-related osteoporosis without current pathological fracture: Secondary | ICD-10-CM | POA: Insufficient documentation

## 2023-06-27 LAB — CBC WITH DIFFERENTIAL (CANCER CENTER ONLY)
Abs Immature Granulocytes: 0 10*3/uL (ref 0.00–0.07)
Basophils Absolute: 0 10*3/uL (ref 0.0–0.1)
Basophils Relative: 1 %
Eosinophils Absolute: 0.1 10*3/uL (ref 0.0–0.5)
Eosinophils Relative: 2 %
HCT: 39.1 % (ref 36.0–46.0)
Hemoglobin: 13.5 g/dL (ref 12.0–15.0)
Immature Granulocytes: 0 %
Lymphocytes Relative: 33 %
Lymphs Abs: 1.3 10*3/uL (ref 0.7–4.0)
MCH: 34.9 pg — ABNORMAL HIGH (ref 26.0–34.0)
MCHC: 34.5 g/dL (ref 30.0–36.0)
MCV: 101 fL — ABNORMAL HIGH (ref 80.0–100.0)
Monocytes Absolute: 0.4 10*3/uL (ref 0.1–1.0)
Monocytes Relative: 11 %
Neutro Abs: 2.1 10*3/uL (ref 1.7–7.7)
Neutrophils Relative %: 53 %
Platelet Count: 175 10*3/uL (ref 150–400)
RBC: 3.87 MIL/uL (ref 3.87–5.11)
RDW: 11.9 % (ref 11.5–15.5)
WBC Count: 4 10*3/uL (ref 4.0–10.5)
nRBC: 0 % (ref 0.0–0.2)

## 2023-06-27 LAB — CMP (CANCER CENTER ONLY)
ALT: 19 U/L (ref 0–44)
AST: 23 U/L (ref 15–41)
Albumin: 4.4 g/dL (ref 3.5–5.0)
Alkaline Phosphatase: 61 U/L (ref 38–126)
Anion gap: 5 (ref 5–15)
BUN: 12 mg/dL (ref 8–23)
CO2: 28 mmol/L (ref 22–32)
Calcium: 9.2 mg/dL (ref 8.9–10.3)
Chloride: 107 mmol/L (ref 98–111)
Creatinine: 0.7 mg/dL (ref 0.44–1.00)
GFR, Estimated: >60 mL/min (ref >60)
Glucose, Bld: 92 mg/dL (ref 70–99)
Potassium: 3.8 mmol/L (ref 3.5–5.1)
Sodium: 140 mmol/L (ref 135–145)
Total Bilirubin: 0.4 mg/dL (ref 0.0–1.2)
Total Protein: 7.8 g/dL (ref 6.5–8.1)

## 2023-06-27 MED ORDER — ZOLEDRONIC ACID 4 MG/100ML IV SOLN
4.0000 mg | Freq: Once | INTRAVENOUS | Status: AC
  Administered 2023-06-27: 4 mg via INTRAVENOUS
  Filled 2023-06-27: qty 100

## 2023-06-27 MED ORDER — SODIUM CHLORIDE 0.9 % IV SOLN
INTRAVENOUS | Status: DC
Start: 2023-06-27 — End: 2023-06-27

## 2023-06-27 MED ORDER — DIPHENHYDRAMINE HCL 50 MG/ML IJ SOLN
25.0000 mg | Freq: Once | INTRAMUSCULAR | Status: AC
  Administered 2023-06-27: 25 mg via INTRAVENOUS
  Filled 2023-06-27: qty 1

## 2023-06-27 NOTE — Patient Instructions (Signed)

## 2023-06-27 NOTE — Assessment & Plan Note (Signed)
 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 Jeryl Moris   Current treatment: Letrozole  2.5 mg started August 2018 Letrozole  toxicities: Hair thinning Joint stiffness: Using CBD oil which has helped her significantly. Vaginal dryness: Patient is planning on starting a nonhormonal medication for the vaginal dryness.  We did briefly discussed about Tawni Fat touch as well as Replens.   Breast cancer surveillance: 1.  Breast exam 06/27/2023: Benign 2. Mammogram 07/12/2022 Right breast: Benign breast density category C 3.  Breast MRI 12/24/2022: Benign breast density category C   Brain MRI 10/10/2022: Benign   Bone density 01/24/2023: T score -2.5 (used to be -2.1): Osteoporosis: Continue with Zometa  infusion: Caused   itching We will need to premedicate her with Benadryl  before the infusion.   Chest wall wound: Follows with Dr. Orin Birk, she underwent latissimus dorsi flap closure.  Although the wound is closed as she has profound pain in the left chest wall.   Return to clinic every 6 months for Zometa  and once a year for follow-up with me   Return to clinic in 1 year for follow-up

## 2023-06-27 NOTE — Progress Notes (Signed)
 Patient Care Team: Joette Mustard, FNP as PCP - General (Family Medicine) Lockie Rima, MD as Consulting Physician (General Surgery) Johna Myers, MD as Consulting Physician (Radiation Oncology) Debbie Fails Laura Polio, NP as Nurse Practitioner (Hematology and Oncology) Cameron Cea, MD as Consulting Physician (Hematology and Oncology)  DIAGNOSIS:  Encounter Diagnosis  Name Primary?   Malignant neoplasm of upper-inner quadrant of left breast in female, estrogen receptor positive (HCC) Yes    SUMMARY OF ONCOLOGIC HISTORY: Oncology History Overview Note  Cancer Staging Malignant neoplasm of upper-inner quadrant of left breast in female, estrogen receptor positive (HCC) Staging form: Breast, AJCC 8th Edition - Clinical stage from 05/22/2016: Stage IA (cT1a, cN0, cM0, G2, ER: Positive, PR: Positive, HER2: Negative) - Signed by Sonja [place], MD on 05/29/2016     Malignant neoplasm of upper-inner quadrant of left breast in female, estrogen receptor positive (HCC)  05/10/2016 Mammogram   Bilateral screening mammogram on 05/10/16 showed a possible distortion with calcifications in the left breast.   05/16/2016 Mammogram   Diagnostic mammogram and US  showed Persistent distortion in the upper inner quadrant of the left breast possibly correlating with the sonographic area of acoustic shadowing, measuring about 6mm. US  of left axilla (-)   05/22/2016 Receptors her2   ER 100%, PR 70%+, Ki67 5%   05/22/2016 Initial Biopsy   Diagnosis Breast, left, needle core biopsy, upper inner - INVASIVE AND IN SITU LOBULAR CARCINOMA, G1-2   05/22/2016 Initial Diagnosis   Malignant neoplasm of upper-inner quadrant of left breast in female, estrogen receptor positive (HCC)   05/31/2016 Genetic Testing   Genetic counseling and testing for hereditary cancer syndromes performed on 05/31/2016. Results are negative for pathogenic mutations in 46 genes analyzed by [facility]'s Common Hereditary Cancers Panel. Results  are dated 06/11/2016. Genes analyzed include: APC, ATM, AXIN2, BARD1, BMPR1A, BRCA1, BRCA2, BRIP1, CDH1, CDKN2A, CHEK2, CTNNA1, DICER1, EPCAM, GREM1, HOXB13, KIT, MEN1, MLH1, MSH2, MSH3, MSH6, MUTYH, NBN, NF1, NTHL1, PALB2, PDGFRA, PMS2, POLD1, POLE, PTEN, RAD50, RAD51C, RAD51D, SDHA, SDHB, SDHC, SDHD, SMAD4, SMARCA4, STK11, TP53, TSC1, TSC2, and VHL.    07/01/2016 Oncotype testing   Oncotype testing showed reoccurance score of 11. With 10 year risk of distant reoccurrence 7% with tamoxifen alone   07/04/2016 Surgery   Left breast lumpectomy with radioactive seed x2 and sentinel lymph node biopsy by Dr. Cherlynn Cornfield   08/08/2016 - 09/24/2016 Radiation Therapy   Patient underwent radiation with Dr.Moody   09/2016 -  Anti-estrogen oral therapy   Letrozole  2.5 mg daily starting 09/2016   05/13/2017 Mammogram   Bilateral diagnostic mammogram and left ultrasonography  IMPRESSION:  1. No suspicious mammographic or sonographic abnormalities in the areas of patient's LEFT breast pain.  2. No mammographic evidence of breast malignancy.    10/14/2017 Imaging   10/14/2017 Breast MRI IMPRESSION: No MRI evidence of malignancy in either breast. Left lumpectomy changes. No adenopathy.     CHIEF COMPLIANT:   HISTORY OF PRESENT ILLNESS: Follow-up of breast cancer and completion of antiestrogen therapy.  Receives Zometa  every 6 months for osteoporosis  History of Present Illness Sally Smith is a 63 year old female with estrogen receptor-positive breast cancer who presents for follow-up of her treatment. She had a recent fall and bruised her right eye. She is completing seven years of Letrozole  treatment for estrogen receptor-positive breast cancer and plans to discontinue the medication once her current supply is exhausted. She has osteopenia with worsening bone density, currently managed with calcium  and vitamin D supplementation and  IV Zometa      ALLERGIES:  is allergic to bee venom, zometa   [zoledronic  acid], azithromycin, erythromycin base, gabapentin , latex, sulfa antibiotics, contrast media [iodinated contrast media], and wound dressing adhesive.  MEDICATIONS:  Current Outpatient Medications  Medication Sig Dispense Refill   aspirin EC 81 MG tablet Take 81 mg by mouth daily. Swallow whole.     atorvastatin  (LIPITOR) 20 MG tablet Take 1 tablet (20 mg total) by mouth daily. 30 tablet 0   buPROPion  (WELLBUTRIN  XL) 150 MG 24 hr tablet Take 1 tablet (150 mg total) by mouth daily. 90 tablet 3   cholecalciferol (VITAMIN D) 1000 units tablet Take 1,000 Units by mouth daily.     docusate sodium (COLACE) 100 MG capsule Take 100 mg by mouth daily.     EPINEPHrine  0.3 mg/0.3 mL IJ SOAJ injection Inject 0.3 mg into the muscle as needed for anaphylaxis. 1 each 0   fluticasone (FLONASE) 50 MCG/ACT nasal spray fluticasone propionate 50 mcg/actuation nasal spray,suspension  Spray 2 sprays every day by intranasal route as directed.     latanoprost (XALATAN) 0.005 % ophthalmic solution Place 1 drop into both eyes at bedtime.     letrozole  (FEMARA ) 2.5 MG tablet Take 1 tablet (2.5 mg total) by mouth daily. 90 tablet 2   loratadine (CLARITIN) 5 MG chewable tablet Chew 5 mg by mouth daily.     LORazepam  (ATIVAN ) 1 MG tablet Take 1 tablet (1 mg total) by mouth 3 (three) times daily as needed for anxiety. 30 tablet 3   Multiple Vitamin (STRESS FORMULA PO) Take 1 tablet by mouth daily as needed (stress).     PROCTO-MED HC  2.5 % rectal cream Place 1 Application rectally daily as needed for hemorrhoids. 30 g 0   Saccharomyces boulardii (DIGESTIVE PROBIOTIC PO) Take 1 capsule by mouth daily.     timolol (TIMOPTIC) 0.5 % ophthalmic solution Place 1 drop into both eyes daily.     zoledronic  acid (RECLAST ) 5 MG/100ML SOLN injection Inject 5 mg into the vein every 6 (six) months. EVERY (due in May/Nov)     zolpidem  (AMBIEN ) 10 MG tablet Take 1 tablet (10 mg total) by mouth at bedtime as needed. 30 tablet  3   Calcium  Carb-Cholecalciferol (CALCIUM -VITAMIN D) 500-200 MG-UNIT tablet Take 1 tablet by mouth daily.  (Patient not taking: Reported on 06/27/2023)     hyaluronate sodium (RADIAPLEXRX) GEL APPLY TOPICALLY ONCE AS NEEDED FOR UP TO 1 DOSE (Patient not taking: Reported on 06/27/2023) 170 g 0   ibuprofen (ADVIL,MOTRIN) 200 MG tablet Take 400 mg by mouth every 6 (six) hours as needed for fever, headache, mild pain, moderate pain or cramping. (Patient not taking: Reported on 06/27/2023)     ondansetron  (ZOFRAN -ODT) 4 MG disintegrating tablet DISSOLVE 1 TABLET IN MOUTH EVERY 8 HOURS AS NEEDED FOR NAUSEA (Patient not taking: Reported on 06/27/2023) 30 tablet 1   No current facility-administered medications for this visit.    PHYSICAL EXAMINATION: ECOG PERFORMANCE STATUS: 1 - Symptomatic but completely ambulatory  Vitals:   06/27/23 1000  BP: 128/74  Pulse: 77  Resp: 16  Temp: (!) 97.1 F (36.2 C)  SpO2: 100%   Filed Weights   06/27/23 1000  Weight: 98 lb 6.4 oz (44.6 kg)      LABORATORY DATA:  I have reviewed the data as listed    Latest Ref Rng & Units 12/27/2022   10:59 AM 06/25/2022   11:09 AM 01/22/2022   11:51 AM  CMP  Glucose  70 - 99 mg/dL 97  94  97   BUN 8 - 23 mg/dL 13  8  7    Creatinine 0.44 - 1.00 mg/dL 4.78  2.95  6.21   Sodium 135 - 145 mmol/L 140  139  143   Potassium 3.5 - 5.1 mmol/L 4.1  3.9  4.0   Chloride 98 - 111 mmol/L 105  105  111   CO2 22 - 32 mmol/L 28  28  26    Calcium  8.9 - 10.3 mg/dL 9.7  9.1  9.5   Total Protein 6.5 - 8.1 g/dL 8.4  7.9  7.2   Total Bilirubin <1.2 mg/dL 0.4  0.6  0.3   Alkaline Phos 38 - 126 U/L 64  59  53   AST 15 - 41 U/L 28  21  20    ALT 0 - 44 U/L 22  15  15      Lab Results  Component Value Date   WBC 4.0 06/27/2023   HGB 13.5 06/27/2023   HCT 39.1 06/27/2023   MCV 101.0 (H) 06/27/2023   PLT 175 06/27/2023   NEUTROABS 2.1 06/27/2023    ASSESSMENT & PLAN:  Malignant neoplasm of upper-inner quadrant of left breast in female,  estrogen receptor positive (HCC) 07/04/2016: Left lumpectomy: 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 Jeryl Moris   Current treatment: Letrozole  2.5 mg started August 2018 Letrozole  toxicities: Hair thinning Joint stiffness: Using CBD oil which has helped her significantly. Vaginal dryness: Patient is planning on starting a nonhormonal medication for the vaginal dryness.  We did briefly discussed about Tawni Fat touch as well as Replens.   Breast cancer surveillance: 1.  Breast exam 06/27/2023: Benign 2. Mammogram 07/12/2022 Right breast: Benign breast density category C 3.  Breast MRI 12/24/2022: Benign breast density category C   Brain MRI 10/10/2022: Benign   Bone density 01/24/2023: T score -2.5 (used to be -2.1): Osteoporosis: Continue with Zometa  infusion: Caused   itching We will need to premedicate her with Benadryl  before the infusion.   Chest wall wound: Follows with Dr. Orin Birk, she underwent latissimus dorsi flap closure.  Although the wound is closed as she has profound pain in the left chest wall.   Return to clinic every 6 months for Zometa  and once a year for follow-up with me   Return to clinic in 1 year for follow-up ------------------------------------- Assessment and Plan Assessment & Plan Malignant neoplasm of upper-inner quadrant of left breast Completed seven years of Letrozole  for estrogen receptor-positive breast cancer. Plan to discontinue Letrozole  after current supply ends. - Discontinue Letrozole  once the current supply is exhausted. - Order mammogram for screening.  Osteopenia Bone density worsened due to menopause and hormonal changes. Improvement expected post-Letrozole  discontinuation. - Schedule Zometa  (Zoledronic  acid) infusion in six months. - Continue calcium  and vitamin D supplementation.  Glaucoma Recovering from eye injury with symptom  improvement using prescribed eye drops and protective eyewear. - Continue prescribed eye drops four times daily. - Use protective eyewear as needed for light sensitivity.      No orders of the defined types were placed in this encounter.  The patient has a good understanding of the overall plan. she agrees with it. she will call with any problems that may develop before the next visit here. Total time spent: 30 mins including face to face time and time spent for planning, charting and co-ordination of care  Viinay K Elmor Kost, MD 06/27/23

## 2023-06-28 ENCOUNTER — Telehealth: Payer: Self-pay | Admitting: Hematology and Oncology

## 2023-06-28 NOTE — Telephone Encounter (Signed)
 Confirmed with pt scheduled appt date and time

## 2023-07-23 ENCOUNTER — Other Ambulatory Visit: Payer: Self-pay | Admitting: Hematology and Oncology

## 2023-07-23 DIAGNOSIS — Z1231 Encounter for screening mammogram for malignant neoplasm of breast: Secondary | ICD-10-CM

## 2023-08-02 ENCOUNTER — Other Ambulatory Visit: Payer: Self-pay | Admitting: Hematology and Oncology

## 2023-08-02 ENCOUNTER — Ambulatory Visit
Admission: RE | Admit: 2023-08-02 | Discharge: 2023-08-02 | Disposition: A | Discharge status: Home / Self Care | Source: Ambulatory Visit | Attending: Hematology and Oncology | Admitting: Hematology and Oncology

## 2023-08-02 DIAGNOSIS — Z1231 Encounter for screening mammogram for malignant neoplasm of breast: Secondary | ICD-10-CM

## 2023-08-08 ENCOUNTER — Telehealth: Payer: Self-pay | Admitting: Hematology and Oncology

## 2023-08-08 NOTE — Telephone Encounter (Signed)
 left vm for pt to call back and schedule futur appt

## 2023-11-07 ENCOUNTER — Other Ambulatory Visit: Payer: Self-pay | Admitting: Hematology and Oncology

## 2023-11-07 NOTE — Telephone Encounter (Signed)
 Refilled [facility] per standing orders.  Cosign requested.

## 2023-12-02 ENCOUNTER — Encounter: Payer: Self-pay | Admitting: *Deleted

## 2023-12-02 ENCOUNTER — Telehealth: Payer: Self-pay | Admitting: *Deleted

## 2023-12-02 NOTE — Telephone Encounter (Addendum)
 Patient with history of breast cancer in 2018 called into this nurse navigator today concerned about a letter she got in the mail. It was from her hospital system in [facility]). She stated she was in their ER on August 26, 2022 and had imaging done. The letter stated they were making sure she followed up with her PCP about what was seen on her lung. Patient was quite worried on phone getting this letter this week from something done over a year ago. She asked if I could find out about it and have anything sent over for Dr. Odean to be aware of. Patient has MRI here on 11/6.   [phone] Imaging - [facility]  [phone] [facility] [phone] and spoke to [facility] in imaging. She verified patient had a CT of chest August 26, 2022.   2. Left message for her PCP Josette Pean in [place] [phone].   3. Received phone call back from patient navigator at [facility]. Prentice at [phone]. Returned call and got voice mail again.  4. Left patient message at 430 pm that I have made several phone calls and am waiting for return calls to see if her PCP is aware of scan/results.

## 2023-12-06 ENCOUNTER — Encounter: Payer: Self-pay | Admitting: *Deleted

## 2023-12-06 ENCOUNTER — Telehealth: Payer: Self-pay | Admitting: *Deleted

## 2023-12-06 NOTE — Telephone Encounter (Signed)
 Received two phone calls follow up from calls earlier in week. PCP Josette Koyanagi office in [place] [phone] called this navigator back stated they never received the ER images from July 2024. They have now requested updated imaging. Patient is aware and is following up with her PCP. Nothing else needed at this time. Patient called and was appreciative for navigator contacting her PCP per her request.

## 2023-12-17 ENCOUNTER — Encounter: Payer: Self-pay | Admitting: Family Medicine

## 2023-12-19 ENCOUNTER — Encounter: Payer: Self-pay | Admitting: General Surgery

## 2023-12-19 ENCOUNTER — Inpatient Hospital Stay: Attending: Hematology and Oncology | Admitting: Hematology and Oncology

## 2023-12-19 DIAGNOSIS — C50212 Malignant neoplasm of upper-inner quadrant of left female breast: Secondary | ICD-10-CM | POA: Diagnosis not present

## 2023-12-19 DIAGNOSIS — R911 Solitary pulmonary nodule: Secondary | ICD-10-CM | POA: Diagnosis not present

## 2023-12-19 DIAGNOSIS — Z170 Estrogen receptor positive status [ER+]: Secondary | ICD-10-CM

## 2023-12-19 NOTE — Progress Notes (Signed)
 HEMATOLOGY-ONCOLOGY TELEPHONE VISIT PROGRESS NOTE  I connected with our patient on 12/19/23 at  8:00 AM EDT by telephone and verified that I am speaking with the correct person using two identifiers.  I discussed the limitations, risks, security and privacy concerns of performing an evaluation and management service by telephone and the availability of in person appointments.  I also discussed with the patient that there may be a patient responsible charge related to this service. The patient expressed understanding and agreed to proceed.   History of Present Illness:    History of Present Illness Sally Smith is a 63 year old female who presents for follow-up of a CT scan showing tree-in-bud nodularity in the right lung.  A CT scan on December 16, 2023, showed tree-in-bud nodularity in the right middle lobe, slightly improved from a previous scan in July 2025. She has no respiratory symptoms and denies breathing difficulties. She leads an active lifestyle, walking two miles daily while pushing her granddaughter in a stroller. Her past medical history includes two episodes of pneumonia, with at least one episode affecting the right lung, occurring approximately 10 to 15 years ago. She has never smoked. During the review of symptoms, she denies any recent respiratory symptoms or changes in her health status.    Oncology History Overview Note  Cancer Staging Malignant neoplasm of upper-inner quadrant of left breast in female, estrogen receptor positive (HCC) Staging form: Breast, AJCC 8th Edition - Clinical stage from 05/22/2016: Stage IA (cT1a, cN0, cM0, G2, ER: Positive, PR: Positive, HER2: Negative) - Signed by Onita Mattock, MD on 05/29/2016     Malignant neoplasm of upper-inner quadrant of left breast in female, estrogen receptor positive (HCC)  05/10/2016 Mammogram   Bilateral screening mammogram on 05/10/16 showed a possible distortion with calcifications in the left breast.    05/16/2016 Mammogram   Diagnostic mammogram and US  showed Persistent distortion in the upper inner quadrant of the left breast possibly correlating with the sonographic area of acoustic shadowing, measuring about 6mm. US  of left axilla (-)   05/22/2016 Receptors her2   ER 100%, PR 70%+, Ki67 5%   05/22/2016 Initial Biopsy   Diagnosis Breast, left, needle core biopsy, upper inner - INVASIVE AND IN SITU LOBULAR CARCINOMA, G1-2   05/22/2016 Initial Diagnosis   Malignant neoplasm of upper-inner quadrant of left breast in female, estrogen receptor positive (HCC)   05/31/2016 Genetic Testing   Genetic counseling and testing for hereditary cancer syndromes performed on 05/31/2016. Results are negative for pathogenic mutations in 46 genes analyzed by [facility]'s Common Hereditary Cancers Panel. Results are dated 06/11/2016. Genes analyzed include: APC, ATM, AXIN2, BARD1, BMPR1A, BRCA1, BRCA2, BRIP1, CDH1, CDKN2A, CHEK2, CTNNA1, DICER1, EPCAM, GREM1, HOXB13, KIT, MEN1, MLH1, MSH2, MSH3, MSH6, MUTYH, NBN, NF1, NTHL1, PALB2, PDGFRA, PMS2, POLD1, POLE, PTEN, RAD50, RAD51C, RAD51D, SDHA, SDHB, SDHC, SDHD, SMAD4, SMARCA4, STK11, TP53, TSC1, TSC2, and VHL.    07/01/2016 Oncotype testing   Oncotype testing showed reoccurance score of 11. With 10 year risk of distant reoccurrence 7% with tamoxifen alone   07/04/2016 Surgery   Left breast lumpectomy with radioactive seed x2 and sentinel lymph node biopsy by Dr. Aron   08/08/2016 - 09/24/2016 Radiation Therapy   Patient underwent radiation with Dr.Moody   09/2016 -  Anti-estrogen oral therapy   Letrozole  2.5 mg daily starting 09/2016   05/13/2017 Mammogram   Bilateral diagnostic mammogram and left ultrasonography  IMPRESSION:  1. No suspicious mammographic or sonographic abnormalities in the areas of  patient's LEFT breast pain.  2. No mammographic evidence of breast malignancy.    10/14/2017 Imaging   10/14/2017 Breast MRI IMPRESSION: No MRI evidence of  malignancy in either breast. Left lumpectomy changes. No adenopathy.     REVIEW OF SYSTEMS:   Constitutional: Denies fevers, chills or abnormal weight loss All other systems were reviewed with the patient and are negative. Observations/Objective:     Assessment Plan:  Malignant neoplasm of upper-inner quadrant of left breast in female, estrogen receptor positive (HCC) 07/04/2016: Left lumpectomy: 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 Dewey   Current treatment: Letrozole  2.5 mg started August 2018 Letrozole  toxicities: Hair thinning Joint stiffness: Using CBD oil which has helped her significantly. Vaginal dryness: Patient is planning on starting a nonhormonal medication for the vaginal dryness.  We did briefly discussed about Almetta Planas touch as well as Replens.   Breast cancer surveillance: 1.  Breast exam 06/27/2023: Benign 2. Mammogram 07/12/2022 Right breast: Benign breast density category C 3.  Breast MRI 12/24/2022: Benign breast density category C Brain MRI 10/10/2022: Benign  CT chest 12/16/2023: [facility] in [place]: Tree-in-bud nodularity in the right middle lobe could be secondary to atypical infection, aspiration or mucous plugging stable compared to previous exam  Radiology discussion: I discussed with patient that the tree-in-bud pattern is usually indicative of bronchiectasis/fungal infections like Aspergillus.  I recommended pulmonary follow-up  Bone density 01/24/2023: T score -2.5 (used to be -2.1): Osteoporosis: Continue with Zometa  infusion: Caused   itching.  Premedication with Benadryl  before infusion    Chest wall wound: Follows with Dr. Lowery, she underwent latissimus dorsi flap closure.  Although the wound is closed as she has profound pain in the left chest wall.   Continue with Zometa   infusions --------------------------------- Assessment and Plan Assessment & Plan Tree-in-bud nodularity of right middle lobe CT scan showed slight improvement in tree-in-bud nodularity. Differential includes infection, mucus plug, bronchiectasis, or fungal infection. No symptoms or recent respiratory issues. Not related to breast cancer. - Refer to pulmonology for further evaluation. - Consider bronchoscopy for sample collection to rule out infection, including fungal.  Malignant neoplasm of upper-inner quadrant of left breast No current breast cancer concerns. MRI scheduled for further evaluation. - Proceed with scheduled breast MRI in two weeks.      I discussed the assessment and treatment plan with the patient. The patient was provided an opportunity to ask questions and all were answered. The patient agreed with the plan and demonstrated an understanding of the instructions. The patient was advised to call back or seek an in-person evaluation if the symptoms worsen or if the condition fails to improve as anticipated.   I provided 20 minutes of non-face-to-face time during this encounter.  This includes time for charting and coordination of care   Naomi MARLA Chad, MD

## 2023-12-19 NOTE — Assessment & Plan Note (Signed)
 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 Dewey   Current treatment: Letrozole  2.5 mg started August 2018 Letrozole  toxicities: Hair thinning Joint stiffness: Using CBD oil which has helped her significantly. Vaginal dryness: Patient is planning on starting a nonhormonal medication for the vaginal dryness.  We did briefly discussed about Almetta Planas touch as well as Replens.   Breast cancer surveillance: 1.  Breast exam 06/27/2023: Benign 2. Mammogram 07/12/2022 Right breast: Benign breast density category C 3.  Breast MRI 12/24/2022: Benign breast density category C Brain MRI 10/10/2022: Benign  CT chest 12/16/2023: [facility] in [place]: Tree-in-bud nodularity in the right middle lobe could be secondary to atypical infection, aspiration or mucous plugging stable compared to previous exam  Radiology discussion: I discussed with patient that the tree-in-bud pattern is usually indicative of bronchiectasis/fungal infections like Aspergillus.  I recommended pulmonary follow-up  Bone density 01/24/2023: T score -2.5 (used to be -2.1): Osteoporosis: Continue with Zometa  infusion: Caused   itching.  Premedication with Benadryl  before infusion    Chest wall wound: Follows with Dr. Lowery, she underwent latissimus dorsi flap closure.  Although the wound is closed as she has profound pain in the left chest wall.   Return to clinic every 6 months for Zometa  and once a year for follow-up with me   Return to clinic in 1 year for follow-up

## 2023-12-25 ENCOUNTER — Encounter: Payer: Self-pay | Admitting: Hematology

## 2023-12-26 ENCOUNTER — Inpatient Hospital Stay: Admission: RE | Admit: 2023-12-26 | Discharge: 2023-12-26 | Attending: Hematology and Oncology

## 2023-12-26 DIAGNOSIS — C50212 Malignant neoplasm of upper-inner quadrant of left female breast: Secondary | ICD-10-CM

## 2023-12-26 MED ORDER — GADOPICLENOL 0.5 MMOL/ML IV SOLN
5.0000 mL | Freq: Once | INTRAVENOUS | Status: AC | PRN
  Administered 2023-12-26: 5 mL via INTRAVENOUS

## 2023-12-30 ENCOUNTER — Other Ambulatory Visit: Payer: Self-pay | Admitting: Hematology and Oncology

## 2023-12-30 ENCOUNTER — Ambulatory Visit: Payer: Self-pay | Admitting: Hematology and Oncology

## 2023-12-30 NOTE — Progress Notes (Signed)
 I called her to inform her that the breast MRI is normal Patient informed me that after her mammogram in June, her breast has continued to have severe bruising. She is coming for Zometa  infusion and would like to see if in the office can see her to evaluate her bruise. I will request Morna to see her after her Zometa  infusion.

## 2024-01-02 ENCOUNTER — Inpatient Hospital Stay: Payer: Self-pay | Attending: Hematology and Oncology

## 2024-01-02 ENCOUNTER — Inpatient Hospital Stay: Payer: Self-pay

## 2024-01-02 VITALS — BP 121/70 | HR 85 | Temp 98.2°F | Resp 16 | Wt 98.8 lb

## 2024-01-02 DIAGNOSIS — C50212 Malignant neoplasm of upper-inner quadrant of left female breast: Secondary | ICD-10-CM

## 2024-01-02 DIAGNOSIS — M810 Age-related osteoporosis without current pathological fracture: Secondary | ICD-10-CM | POA: Insufficient documentation

## 2024-01-02 DIAGNOSIS — Z853 Personal history of malignant neoplasm of breast: Secondary | ICD-10-CM | POA: Diagnosis not present

## 2024-01-02 DIAGNOSIS — R918 Other nonspecific abnormal finding of lung field: Secondary | ICD-10-CM | POA: Diagnosis not present

## 2024-01-02 DIAGNOSIS — Z85118 Personal history of other malignant neoplasm of bronchus and lung: Secondary | ICD-10-CM | POA: Insufficient documentation

## 2024-01-02 LAB — CMP (CANCER CENTER ONLY)
ALT: 19 U/L (ref 0–44)
AST: 21 U/L (ref 15–41)
Albumin: 4.3 g/dL (ref 3.5–5.0)
Alkaline Phosphatase: 58 U/L (ref 38–126)
Anion gap: 6 (ref 5–15)
BUN: 9 mg/dL (ref 8–23)
CO2: 28 mmol/L (ref 22–32)
Calcium: 9.5 mg/dL (ref 8.9–10.3)
Chloride: 105 mmol/L (ref 98–111)
Creatinine: 0.67 mg/dL (ref 0.44–1.00)
GFR, Estimated: >60 mL/min (ref >60)
Glucose, Bld: 84 mg/dL (ref 70–99)
Potassium: 4.2 mmol/L (ref 3.5–5.1)
Sodium: 139 mmol/L (ref 135–145)
Total Bilirubin: 0.5 mg/dL (ref 0.0–1.2)
Total Protein: 7.6 g/dL (ref 6.5–8.1)

## 2024-01-02 LAB — CBC WITH DIFFERENTIAL (CANCER CENTER ONLY)
Abs Immature Granulocytes: 0.01 K/uL (ref 0.00–0.07)
Basophils Absolute: 0 K/uL (ref 0.0–0.1)
Basophils Relative: 1 %
Eosinophils Absolute: 0.1 K/uL (ref 0.0–0.5)
Eosinophils Relative: 2 %
HCT: 40.4 % (ref 36.0–46.0)
Hemoglobin: 13.9 g/dL (ref 12.0–15.0)
Immature Granulocytes: 0 %
Lymphocytes Relative: 37 %
Lymphs Abs: 1.7 K/uL (ref 0.7–4.0)
MCH: 34.4 pg — ABNORMAL HIGH (ref 26.0–34.0)
MCHC: 34.4 g/dL (ref 30.0–36.0)
MCV: 100 fL (ref 80.0–100.0)
Monocytes Absolute: 0.5 K/uL (ref 0.1–1.0)
Monocytes Relative: 11 %
Neutro Abs: 2.1 K/uL (ref 1.7–7.7)
Neutrophils Relative %: 49 %
Platelet Count: 163 K/uL (ref 150–400)
RBC: 4.04 MIL/uL (ref 3.87–5.11)
RDW: 11.6 % (ref 11.5–15.5)
WBC Count: 4.4 K/uL (ref 4.0–10.5)
nRBC: 0 % (ref 0.0–0.2)

## 2024-01-02 MED ORDER — DIPHENHYDRAMINE HCL 50 MG/ML IJ SOLN
25.0000 mg | Freq: Once | INTRAMUSCULAR | Status: AC
  Administered 2024-01-02: 25 mg via INTRAVENOUS
  Filled 2024-01-02: qty 1

## 2024-01-02 MED ORDER — SODIUM CHLORIDE 0.9 % IV SOLN: Freq: Once | INTRAVENOUS | Status: AC

## 2024-01-02 MED ORDER — ZOLEDRONIC ACID 4 MG/100ML IV SOLN
4.0000 mg | Freq: Once | INTRAVENOUS | Status: AC
  Administered 2024-01-02: 4 mg via INTRAVENOUS
  Filled 2024-01-02: qty 100

## 2024-01-02 NOTE — Patient Instructions (Signed)

## 2024-01-02 NOTE — Progress Notes (Signed)
 Patient denies any upcoming dental procedures or dental pain. Patient taking calcium  and vitamin D supplementation as advised.

## 2024-03-05 ENCOUNTER — Telehealth: Payer: Self-pay

## 2024-03-05 ENCOUNTER — Encounter: Payer: Self-pay | Admitting: *Deleted

## 2024-03-05 ENCOUNTER — Other Ambulatory Visit: Payer: Self-pay | Admitting: *Deleted

## 2024-03-05 NOTE — Telephone Encounter (Signed)
 Received VM from pt stating she had questions about how to set up appointments. RN attempted to reach pt X1, left vm.

## 2024-03-05 NOTE — Telephone Encounter (Signed)
 Reached out to pt and let her know to reach out to pulmonologist Dr. Alva to schedule an appt. Also, informed her that Gudena MD would like to have her seen by Morna Kendall NP at symptom management clinic. Appt made for tomorrow at 8:30. Pt notified and verbalized understanding.

## 2024-03-05 NOTE — Progress Notes (Signed)
 Patient left message on navigator's machine regarding a trip to her dentist where an enlarged lymph node was noted. She stated she was given a referral ENT in [place] but wanted to ask about one in [place]. Message relayed to Dr. Gara nurses Earnie and Larraine who have tried to call patient back regarding message. Requested they please attempt calling patient back tomorrow regarding her concern.

## 2024-03-05 NOTE — Progress Notes (Signed)
 Pt reached out wondering why she was never contacted regarding scheduling her appointment after referral was made by our office in October of 2025. RN reached out to  pulmonology office at Dr. Jude office at [phone]. Call center team member sent them a message marked as urgent to f/u w/ pt to schedule visit.

## 2024-03-06 ENCOUNTER — Encounter: Payer: Self-pay | Admitting: Adult Health

## 2024-03-06 ENCOUNTER — Ambulatory Visit (HOSPITAL_COMMUNITY)
Admission: RE | Admit: 2024-03-06 | Discharge: 2024-03-06 | Disposition: A | Discharge status: Home / Self Care | Source: Ambulatory Visit | Attending: Adult Health | Admitting: Adult Health

## 2024-03-06 ENCOUNTER — Inpatient Hospital Stay: Attending: Hematology and Oncology | Admitting: Adult Health

## 2024-03-06 ENCOUNTER — Ambulatory Visit: Payer: Self-pay

## 2024-03-06 VITALS — BP 111/59 | HR 70 | Temp 98.2°F | Resp 18 | Ht 63.0 in | Wt 97.4 lb

## 2024-03-06 DIAGNOSIS — R591 Generalized enlarged lymph nodes: Secondary | ICD-10-CM | POA: Diagnosis not present

## 2024-03-06 DIAGNOSIS — R0789 Other chest pain: Secondary | ICD-10-CM | POA: Insufficient documentation

## 2024-03-06 DIAGNOSIS — C50212 Malignant neoplasm of upper-inner quadrant of left female breast: Secondary | ICD-10-CM | POA: Insufficient documentation

## 2024-03-06 DIAGNOSIS — Z170 Estrogen receptor positive status [ER+]: Secondary | ICD-10-CM | POA: Insufficient documentation

## 2024-03-06 DIAGNOSIS — R9389 Abnormal findings on diagnostic imaging of other specified body structures: Secondary | ICD-10-CM

## 2024-03-06 NOTE — Telephone Encounter (Signed)
-----   Message from Morna Kendall, NP sent at 03/06/2024 10:52 AM EST ----- Please tell patient ribs look good, take the anti inflammatory twice a day like we discussed.    Thanks, LC

## 2024-03-06 NOTE — Progress Notes (Unsigned)
 [facility] Cancer Follow up:    Erskine Neptune, FNP [address] [place] [id number]   DIAGNOSIS: Cancer Staging  Malignant neoplasm of upper-inner quadrant of left breast in female, estrogen receptor positive (HCC) Staging form: Breast, AJCC 8th Edition - Pathologic stage from 07/31/2016: Stage IA (pT2, pN0(sn), cM0, G1, ER+, PR+, HER2-, Oncotype DX score: 11) - Unsigned Neoadjuvant therapy: No Method of lymph node assessment: Sentinel lymph node biopsy Nuclear grade: G1 Multigene prognostic tests performed: Oncotype DX Recurrence score range: Greater than or equal to 11 Histologic grading system: 3 grade system Laterality: Left    SUMMARY OF ONCOLOGIC HISTORY: Oncology History Overview Note  Cancer Staging Malignant neoplasm of upper-inner quadrant of left breast in female, estrogen receptor positive (HCC) Staging form: Breast, AJCC 8th Edition - Clinical stage from 05/22/2016: Stage IA (cT1a, cN0, cM0, G2, ER: Positive, PR: Positive, HER2: Negative) - Signed by Onita Mattock, MD on 05/29/2016     Malignant neoplasm of upper-inner quadrant of left breast in female, estrogen receptor positive (HCC)  05/10/2016 Mammogram   Bilateral screening mammogram on 05/10/16 showed a possible distortion with calcifications in the left breast.   05/16/2016 Mammogram   Diagnostic mammogram and US  showed Persistent distortion in the upper inner quadrant of the left breast possibly correlating with the sonographic area of acoustic shadowing, measuring about 6mm. US  of left axilla (-)   05/22/2016 Receptors her2   ER 100%, PR 70%+, Ki67 5%   05/22/2016 Initial Biopsy   Diagnosis Breast, left, needle core biopsy, upper inner - INVASIVE AND IN SITU LOBULAR CARCINOMA, G1-2   05/22/2016 Initial Diagnosis   Malignant neoplasm of upper-inner quadrant of left breast in female, estrogen receptor positive (HCC)   05/31/2016 Genetic Testing   Genetic counseling and testing for  hereditary cancer syndromes performed on 05/31/2016. Results are negative for pathogenic mutations in 46 genes analyzed by [facility]'s Common Hereditary Cancers Panel. Results are dated 06/11/2016. Genes analyzed include: APC, ATM, AXIN2, BARD1, BMPR1A, BRCA1, BRCA2, BRIP1, CDH1, CDKN2A, CHEK2, CTNNA1, DICER1, EPCAM, GREM1, HOXB13, KIT, MEN1, MLH1, MSH2, MSH3, MSH6, MUTYH, NBN, NF1, NTHL1, PALB2, PDGFRA, PMS2, POLD1, POLE, PTEN, RAD50, RAD51C, RAD51D, SDHA, SDHB, SDHC, SDHD, SMAD4, SMARCA4, STK11, TP53, TSC1, TSC2, and VHL.    07/01/2016 Oncotype testing   Oncotype testing showed reoccurance score of 11. With 10 year risk of distant reoccurrence 7% with tamoxifen alone   07/04/2016 Surgery   Left breast lumpectomy with radioactive seed x2 and sentinel lymph node biopsy by Dr. Aron   08/08/2016 - 09/24/2016 Radiation Therapy   Patient underwent radiation with Dr.Moody   09/2016 -  Anti-estrogen oral therapy   Letrozole  2.5 mg daily starting 09/2016   05/13/2017 Mammogram   Bilateral diagnostic mammogram and left ultrasonography  IMPRESSION:  1. No suspicious mammographic or sonographic abnormalities in the areas of patient's LEFT breast pain.  2. No mammographic evidence of breast malignancy.    10/14/2017 Imaging   10/14/2017 Breast MRI IMPRESSION: No MRI evidence of malignancy in either breast. Left lumpectomy changes. No adenopathy.     CURRENT THERAPY:  INTERVAL HISTORY:  Discussed the use of AI scribe software for clinical note transcription with the patient, who gave verbal consent to proceed.  History of Present Illness      Patient Active Problem List   Diagnosis Date Noted   Malnutrition of moderate degree 03/17/2021   Open wound of chest wall, left, initial encounter 03/15/2021   History of cervical dysplasia 01/27/2021  Open wound of chest wall 01/03/2021   Genetic testing 06/18/2016   Malignant neoplasm of upper-inner quadrant of left breast in female,  estrogen receptor positive (HCC) 05/25/2016    is allergic to bee venom, zometa  [zoledronic  acid], azithromycin, erythromycin base, gabapentin , latex, sulfa antibiotics, contrast media [iodinated contrast media], and wound dressing adhesive.  MEDICAL HISTORY: Past Medical History:  Diagnosis Date   ADHD (attention deficit hyperactivity disorder)    Allergy    seasonal allergies   Anxiety    on meds   Breast cancer (HCC) 07/03/2016   Cancer of left breast (HCC)    [facility] genetic testing negative on 05/31/16   Carotid artery disease    Complication of anesthesia    Vagals easily to pain   Diverticulitis    Genetic testing 06/18/2016   Ms. Wee underwent genetic counseling and testing for hereditary cancer syndromes on 05/31/2016. Her results were negative for mutations in all 46 genes analyzed by [facility]'s 46-gene Common Hereditary Cancers Panel. Genes analyzed include: APC, ATM, AXIN2, BARD1, BMPR1A, BRCA1, BRCA2, BRIP1, CDH1, CDKN2A, CHEK2, CTNNA1, DICER1, EPCAM, GREM1, HOXB13, KIT, MEN1, MLH1, MSH2, MSH3, MSH6, MUTYH, NBN,   Glaucoma    on meds   Hyperlipidemia    on meds   Personal history of radiation therapy 2018   Left Breast Cancer   Pneumonia    PONV (postoperative nausea and vomiting)    Skin cancer    left breast     SURGICAL HISTORY: Past Surgical History:  Procedure Laterality Date   BREAST BIOPSY Right    BREAST EXCISIONAL BIOPSY Left 04/2020   BREAST LUMPECTOMY Left 07/03/2016   BREAST LUMPECTOMY WITH RADIOACTIVE SEED AND SENTINEL LYMPH NODE BIOPSY Left 07/04/2016   Procedure: LEFT BREAST LUMPECTOMY WITH RADIOACTIVE SEED X 2 AND SENTINEL LYMPH NODE BIOPSY;  Surgeon: Aron Shoulders, MD;  Location: [facility];  Service: General;  Laterality: Left;   EYE SURGERY Bilateral 11/25/2020   cataract surgery   LATISSIMUS FLAP TO BREAST N/A 03/15/2021   Procedure: Latissimus muscle flap for chest wall reconstruction;  Surgeon:  Lowery Estefana RAMAN, DO;  Location: [facility] OR;  Service: Plastics;  Laterality: N/A;  4 hours   MASS EXCISION Left 03/15/2021   Procedure: CHEST WALL TUMOR EXCISION;  Surgeon: Shyrl Linnie KIDD, MD;  Location: [facility] OR;  Service: Thoracic;  Laterality: Left;   SKIN BIOPSY Left 05/17/2020   Procedure: INCISIONAL BIOPSY LEFT  CHEST WALL MASS;  Surgeon: Aron Shoulders, MD;  Location: [facility];  Service: General;  Laterality: Left;   surgery on right leg Right    melonoma   TONSILLECTOMY     WISDOM TOOTH EXTRACTION  1985    SOCIAL HISTORY: Social History   Socioeconomic History   Marital status: Married    Spouse name: Not on file   Number of children: Not on file   Years of education: Not on file   Highest education level: Not on file  Occupational History   Not on file  Tobacco Use   Smoking status: Never   Smokeless tobacco: Never  Vaping Use   Vaping status: Never Used  Substance and Sexual Activity   Alcohol use: Yes    Comment: rare   Drug use: No    Comment: uses CBD oil from [facility]   Sexual activity: Yes    Birth control/protection: Surgical    Comment: husband had vasectomy  Other Topics Concern   Not on file  Social History Narrative  Not on file   Social Drivers of Health   Tobacco Use: Low Risk (03/06/2024)   Patient History    Smoking Tobacco Use: Never    Smokeless Tobacco Use: Never    Passive Exposure: Not on file  Financial Resource Strain: Low Risk (03/06/2024)   Overall Financial Resource Strain (CARDIA)    Difficulty of Paying Living Expenses: Not hard at all  Food Insecurity: No Food Insecurity (03/06/2024)   Epic    Worried About Programme Researcher, Broadcasting/film/video in the Last Year: Never true    Ran Out of Food in the Last Year: Never true  Transportation Needs: No Transportation Needs (03/06/2024)   Epic    Lack of Transportation (Medical): No    Lack of Transportation (Non-Medical): No  Physical Activity:  Insufficiently Active (03/06/2024)   Exercise Vital Sign    Days of Exercise per Week: 2 days    Minutes of Exercise per Session: 30 min  Stress: No Stress Concern Present (03/06/2024)   [employer] of Occupational Health - Occupational Stress Questionnaire    Feeling of Stress: Not at all  Social Connections: Socially Integrated (03/06/2024)   Social Connection and Isolation Panel    Frequency of Communication with Friends and Family: More than three times a week    Frequency of Social Gatherings with Friends and Family: More than three times a week    Attends Religious Services: More than 4 times per year    Active Member of Clubs or Organizations: Yes    Attends Banker Meetings: 1 to 4 times per year    Marital Status: Married  Catering Manager Violence: Not At Risk (03/06/2024)   Epic    Fear of Current or Ex-Partner: No    Emotionally Abused: No    Physically Abused: No    Sexually Abused: No  Depression (PHQ2-9): Low Risk (03/06/2024)   Depression (PHQ2-9)    PHQ-2 Score: 0  Alcohol Screen: Low Risk (03/06/2024)   Alcohol Screen    Last Alcohol Screening Score (AUDIT): 1  Housing: Unknown (03/06/2024)   Epic    Unable to Pay for Housing in the Last Year: No    Number of Times Moved in the Last Year: Not on file    Homeless in the Last Year: No  Utilities: Not At Risk (03/06/2024)   Epic    Threatened with loss of utilities: No  Health Literacy: Adequate Health Literacy (03/06/2024)   B1300 Health Literacy    Frequency of need for help with medical instructions: Never    FAMILY HISTORY: Family History  Problem Relation Age of Onset   Breast cancer Mother 23       Contralateral breast cancer a few years later that metastasized.   Colon cancer Neg Hx    Ovarian cancer Neg Hx    Endometrial cancer Neg Hx    Pancreatic cancer Neg Hx    Prostate cancer Neg Hx     Review of Systems - Oncology    PHYSICAL  EXAMINATION    Vitals:   03/06/24 0832  BP: (!) 111/59  Pulse: 70  Resp: 18  Temp: 98.2 F (36.8 C)  SpO2: 98%    Physical Exam  LABORATORY DATA:  CBC    Component Value Date/Time   WBC 4.4 01/02/2024 0800   WBC 5.1 03/13/2021 1428   RBC 4.04 01/02/2024 0800   HGB 13.9 01/02/2024 0800   HGB 13.5 01/04/2017 1016   HCT 40.4 01/02/2024 0800  HCT 39.5 01/04/2017 1016   PLT 163 01/02/2024 0800   PLT 166 01/04/2017 1016   MCV 100.0 01/02/2024 0800   MCV 102.0 (H) 01/04/2017 1016   MCH 34.4 (H) 01/02/2024 0800   MCHC 34.4 01/02/2024 0800   RDW 11.6 01/02/2024 0800   RDW 12.7 01/04/2017 1016   LYMPHSABS 1.7 01/02/2024 0800   LYMPHSABS 1.3 01/04/2017 1016   MONOABS 0.5 01/02/2024 0800   MONOABS 0.5 01/04/2017 1016   EOSABS 0.1 01/02/2024 0800   EOSABS 0.1 01/04/2017 1016   BASOSABS 0.0 01/02/2024 0800   BASOSABS 0.0 01/04/2017 1016    CMP     Component Value Date/Time   NA 139 01/02/2024 0800   NA 136 01/04/2017 1016   K 4.2 01/02/2024 0800   K 3.6 01/04/2017 1016   CL 105 01/02/2024 0800   CO2 28 01/02/2024 0800   CO2 26 01/04/2017 1016   GLUCOSE 84 01/02/2024 0800   GLUCOSE 85 01/04/2017 1016   BUN 9 01/02/2024 0800   BUN 7.6 01/04/2017 1016   CREATININE 0.67 01/02/2024 0800   CREATININE 0.8 01/04/2017 1016   CALCIUM  9.5 01/02/2024 0800   CALCIUM  9.6 01/04/2017 1016   PROT 7.6 01/02/2024 0800   PROT 7.8 01/04/2017 1016   ALBUMIN  4.3 01/02/2024 0800   ALBUMIN  4.1 01/04/2017 1016   AST 21 01/02/2024 0800   AST 22 01/04/2017 1016   ALT 19 01/02/2024 0800   ALT 30 01/04/2017 1016   ALKPHOS 58 01/02/2024 0800   ALKPHOS 87 01/04/2017 1016   BILITOT 0.5 01/02/2024 0800   BILITOT 0.40 01/04/2017 1016   GFRNONAA >60 01/02/2024 0800   GFRAA >60 06/01/2019 1249     ASSESSMENT and THERAPY PLAN:   No problem-specific Assessment & Plan notes found for this encounter.   Assessment and Plan Assessment & Plan       All questions were answered.  The patient knows to call the clinic with any problems, questions or concerns. We can certainly see the patient much sooner if necessary.  Total encounter time:*** minutes*in face-to-face visit time, chart review, lab review, care coordination, order entry, and documentation of the encounter time.    Morna Kendall, NP 03/06/24 9:08 AM Medical Oncology and Hematology [facility] [address] [place], [place] [address] Tel. [phone]    Fax. [phone]  *Total Encounter Time as defined by the Centers for Medicare and Medicaid Services includes, in addition to the face-to-face time of a patient visit (documented in the note above) non-face-to-face time: obtaining and reviewing outside history, ordering and reviewing medications, tests or procedures, care coordination (communications with other health care professionals or caregivers) and documentation in the medical record.

## 2024-03-09 ENCOUNTER — Encounter: Payer: Self-pay | Admitting: Hematology

## 2024-03-09 NOTE — Assessment & Plan Note (Signed)
 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 Dewey   Current treatment: Letrozole  2.5 mg started August 2018 Letrozole  toxicities: Hair thinning Joint stiffness: Using CBD oil which has helped her significantly. Vaginal dryness: Patient is planning on starting a nonhormonal medication for the vaginal dryness.  We did briefly discussed about Almetta Planas touch as well as Replens.   Breast cancer surveillance: 1.  Breast exam 06/27/2023: Benign 2. Mammogram 07/12/2022 Right breast: Benign breast density category C 3.  Breast MRI 12/24/2022: Benign breast density category C Brain MRI 10/10/2022: Benign  CT chest 12/16/2023:Reviewed by Dr. Odean, I placed another referral to pulmonology  Bone density 01/24/2023: T score -2.5 (used to be -2.1): Osteoporosis: Continue with Zometa  infusion: Caused   itching.  Premedication with Benadryl  before infusion  Left CW pain: will obtain xrays of the ribs and lungs to further evaluate Cervical adenopathy: ultrasound ordered to further evaluate the LN further.    RTC as scheduled with Dr. Gudena or sooner if needed based on above testing.

## 2024-03-11 ENCOUNTER — Ambulatory Visit (HOSPITAL_COMMUNITY)
Admission: RE | Admit: 2024-03-11 | Discharge: 2024-03-11 | Disposition: A | Discharge status: Home / Self Care | Source: Ambulatory Visit | Attending: Adult Health | Admitting: Adult Health

## 2024-03-11 DIAGNOSIS — Z170 Estrogen receptor positive status [ER+]: Secondary | ICD-10-CM | POA: Diagnosis present

## 2024-03-11 DIAGNOSIS — C50212 Malignant neoplasm of upper-inner quadrant of left female breast: Secondary | ICD-10-CM | POA: Insufficient documentation

## 2024-03-11 DIAGNOSIS — R591 Generalized enlarged lymph nodes: Secondary | ICD-10-CM | POA: Insufficient documentation

## 2024-04-08 ENCOUNTER — Ambulatory Visit

## 2024-06-29 ENCOUNTER — Other Ambulatory Visit

## 2024-06-29 ENCOUNTER — Ambulatory Visit

## 2024-06-29 ENCOUNTER — Ambulatory Visit: Admitting: Hematology and Oncology
